# Patient Record
Sex: Male | Born: 1950
Health system: Southern US, Community
[De-identification: ages and names within clinical notes are randomized; demographics above are authoritative.]

## PROBLEM LIST (undated history)

## (undated) DIAGNOSIS — R739 Hyperglycemia, unspecified: Secondary | ICD-10-CM

## (undated) DIAGNOSIS — I255 Ischemic cardiomyopathy: Secondary | ICD-10-CM

## (undated) DIAGNOSIS — Z45010 Encounter for checking and testing of cardiac pacemaker pulse generator [battery]: Secondary | ICD-10-CM

## (undated) DIAGNOSIS — E785 Hyperlipidemia, unspecified: Secondary | ICD-10-CM

## (undated) DIAGNOSIS — I5022 Chronic systolic (congestive) heart failure: Secondary | ICD-10-CM

## (undated) DIAGNOSIS — I2510 Atherosclerotic heart disease of native coronary artery without angina pectoris: Secondary | ICD-10-CM

## (undated) DIAGNOSIS — M1990 Unspecified osteoarthritis, unspecified site: Secondary | ICD-10-CM

## (undated) DIAGNOSIS — I5032 Chronic diastolic (congestive) heart failure: Secondary | ICD-10-CM

## (undated) DIAGNOSIS — R0602 Shortness of breath: Secondary | ICD-10-CM

## (undated) DIAGNOSIS — I10 Essential (primary) hypertension: Secondary | ICD-10-CM

## (undated) DIAGNOSIS — E119 Type 2 diabetes mellitus without complications: Secondary | ICD-10-CM

## (undated) DIAGNOSIS — I5020 Unspecified systolic (congestive) heart failure: Secondary | ICD-10-CM

## (undated) DIAGNOSIS — M549 Dorsalgia, unspecified: Secondary | ICD-10-CM

## (undated) DIAGNOSIS — N183 Chronic kidney disease, stage 3 unspecified: Secondary | ICD-10-CM

## (undated) DIAGNOSIS — Z95810 Presence of automatic (implantable) cardiac defibrillator: Secondary | ICD-10-CM

## (undated) HISTORY — DX: Dorsalgia, unspecified: M54.9

## (undated) HISTORY — DX: Shortness of breath: R06.02

## (undated) HISTORY — DX: Atherosclerotic heart disease of native coronary artery without angina pectoris: I25.10

## (undated) HISTORY — DX: Essential (primary) hypertension: I10

## (undated) HISTORY — DX: Hyperlipidemia, unspecified: E78.5

## (undated) HISTORY — PX: CARDIAC CATHETERIZATION: SHX172

## (undated) HISTORY — PX: LUMBAR DISC SURGERY: SHX700

## (undated) HISTORY — DX: Encounter for checking and testing of cardiac pacemaker pulse generator (battery): Z45.010

## (undated) HISTORY — DX: Hyperglycemia, unspecified: R73.9

## (undated) HISTORY — PX: BACK SURGERY: SHX140

---

## 2001-08-08 ENCOUNTER — Encounter: Payer: Self-pay | Admitting: Neurosurgery

## 2001-08-08 ENCOUNTER — Ambulatory Visit (HOSPITAL_COMMUNITY): Admission: RE | Admit: 2001-08-08 | Discharge: 2001-08-09 | Payer: Self-pay | Admitting: Neurosurgery

## 2005-08-25 HISTORY — PX: CORONARY ANGIOPLASTY WITH STENT PLACEMENT: SHX49

## 2005-08-27 ENCOUNTER — Inpatient Hospital Stay (HOSPITAL_COMMUNITY): Admission: AD | Admit: 2005-08-27 | Discharge: 2005-08-29 | Payer: Self-pay | Admitting: Cardiology

## 2005-08-27 ENCOUNTER — Ambulatory Visit: Payer: Self-pay | Admitting: Cardiology

## 2005-09-01 ENCOUNTER — Inpatient Hospital Stay (HOSPITAL_COMMUNITY): Admission: EM | Admit: 2005-09-01 | Discharge: 2005-09-02 | Payer: Self-pay | Admitting: Emergency Medicine

## 2005-09-01 ENCOUNTER — Ambulatory Visit: Payer: Self-pay | Admitting: Internal Medicine

## 2005-09-01 ENCOUNTER — Encounter: Payer: Self-pay | Admitting: Cardiology

## 2005-09-12 ENCOUNTER — Ambulatory Visit: Payer: Self-pay | Admitting: Cardiology

## 2005-12-04 ENCOUNTER — Ambulatory Visit: Payer: Self-pay | Admitting: Cardiology

## 2005-12-20 ENCOUNTER — Ambulatory Visit: Payer: Self-pay | Admitting: Cardiology

## 2006-06-07 ENCOUNTER — Ambulatory Visit: Payer: Self-pay | Admitting: Cardiology

## 2007-08-20 ENCOUNTER — Ambulatory Visit: Payer: Self-pay | Admitting: Cardiology

## 2008-08-06 ENCOUNTER — Encounter: Payer: Self-pay | Admitting: Cardiology

## 2008-08-28 ENCOUNTER — Encounter: Payer: Self-pay | Admitting: Cardiology

## 2009-05-18 ENCOUNTER — Encounter: Payer: Self-pay | Admitting: Cardiology

## 2009-06-16 ENCOUNTER — Encounter: Payer: Self-pay | Admitting: Cardiology

## 2009-06-16 DIAGNOSIS — I2510 Atherosclerotic heart disease of native coronary artery without angina pectoris: Secondary | ICD-10-CM | POA: Insufficient documentation

## 2009-06-16 DIAGNOSIS — R0602 Shortness of breath: Secondary | ICD-10-CM | POA: Insufficient documentation

## 2009-06-16 DIAGNOSIS — E785 Hyperlipidemia, unspecified: Secondary | ICD-10-CM

## 2009-06-16 DIAGNOSIS — I10 Essential (primary) hypertension: Secondary | ICD-10-CM | POA: Insufficient documentation

## 2009-06-17 ENCOUNTER — Ambulatory Visit: Payer: Self-pay | Admitting: Cardiology

## 2009-10-26 ENCOUNTER — Encounter: Payer: Self-pay | Admitting: Cardiology

## 2009-11-03 ENCOUNTER — Encounter: Payer: Self-pay | Admitting: Cardiology

## 2009-11-25 ENCOUNTER — Encounter: Payer: Self-pay | Admitting: Physician Assistant

## 2009-12-28 ENCOUNTER — Telehealth (INDEPENDENT_AMBULATORY_CARE_PROVIDER_SITE_OTHER): Payer: Self-pay | Admitting: *Deleted

## 2009-12-29 ENCOUNTER — Encounter: Payer: Self-pay | Admitting: Cardiology

## 2010-01-03 ENCOUNTER — Ambulatory Visit: Payer: Self-pay | Admitting: Cardiology

## 2010-01-04 ENCOUNTER — Ambulatory Visit: Payer: Self-pay | Admitting: Cardiology

## 2010-01-04 ENCOUNTER — Encounter: Payer: Self-pay | Admitting: Cardiology

## 2010-02-11 ENCOUNTER — Ambulatory Visit (HOSPITAL_COMMUNITY)
Admission: RE | Admit: 2010-02-11 | Discharge: 2010-02-11 | Payer: Self-pay | Source: Home / Self Care | Admitting: Orthopedic Surgery

## 2010-04-26 ENCOUNTER — Inpatient Hospital Stay: Admission: RE | Admit: 2010-04-26 | Payer: Self-pay | Source: Home / Self Care | Admitting: Orthopedic Surgery

## 2010-04-28 NOTE — Letter (Signed)
Summary: Letter/ FAXED [facility] ABOUT SURGERY  Letter/ FAXED [facility] ABOUT SURGERY   Imported By: Dorise Hiss 12/29/2009 12:23:24  _____________________________________________________________________  External Attachment:    Type:   Image     Comment:   External Document

## 2010-04-28 NOTE — Assessment & Plan Note (Signed)
Summary: SURGICAL CLEARANCE-JM   Visit Type:  Pre-op Evaluation Referring Provider:  Donzetta Sprung Primary Provider:  Dr. Doreen Beam  CC:  CAD.  History of Present Illness: The patient is seen for followup of coronary artery disease and or cardiac clearance for major back surgery.  The patient had a non-STEMI in 2007.  He received 2 drug-eluting stents to the LAD at that time.  He has done well.  Originally with his heart disease he had significant anterior chest pain.  He has not had any of this.  He was fully active until approximately 4 months ago.  He therefore gives no history of recent angina or congestive heart failure.  There have been no substantial arrhythmias.  Unfortunately we cannot assess his exercise capacity currently because of his back pain.  Preventive Screening-Counseling & Management  Alcohol-Tobacco     Smoking Status: quit     Year Quit: 1975     Cans of tobacco/week: dip 2 cans snuff/week     Tobacco Counseling: not to resume use of tobacco products  Current Medications (verified): 1)  Lipitor 80 Mg Tabs (Atorvastatin Calcium) .[name].. 1 Tablet By Mouth At Bedtime. 2)  Lisinopril 20 Mg Tabs (Lisinopril) .... Take 1 Tablet By Mouth Once A Day 3)  Plavix 75 Mg Tabs (Clopidogrel Bisulfate) .... Take 1 Tablet By Mouth Once A Day. 4)  Toprol Xl 100 Mg Xr24h-Tab (Metoprolol Succinate) .... Take 1 Tablet By Mouth Once A Day. 5)  Aspirin Ec 325 Mg Tbec (Aspirin) .... Take One Tablet By Mouth Daily 6)  Nitrostat 0.4 Mg Subl (Nitroglycerin) .[name].. 1 Tablet Under Tongue At Onset of Chest Pain; You May Repeat Every 5 Minutes For Up To 3 Doses. 7)  Gabapentin 300 Mg Caps (Gabapentin) .... Take 1 Tablet By Mouth Three Times A Day 8)  Hydrocodone-Acetaminophen 7.5-500 Mg Tabs (Hydrocodone-Acetaminophen) .... Take 1 Tablet By Mouth Three Times A Day  Allergies (verified): No Known Drug Allergies  Comments:  Nurse/Medical Assistant: The patient's medication list and allergies  were reviewed with the patient and were updated in the Medication and Allergy Lists.  Past History:  Past Medical History: CAD.Marland KitchenNSTEMI..2007.Marland Kitchentwo DES  LAD  (kissing balloon) Plavix indefinitely SOB EF  60% Dyslipidemia Hypertension Cardiac clearance..... for back surgery... October, 2011  Social History: Smoking Status:  quit Cans of tobacco/week:  dip 2 cans snuff/week  Review of Systems       Patient denies fever, chills, headache, sweats, rash, change in vision, change in hearing, chest pain, cough, nausea vomiting, urinary symptoms.  All other systems are reviewed and are negative.  Vital Signs:  Patient profile:   60 year old male Height:      73 inches Weight:      233 pounds Pulse rate:   66 / minute BP sitting:   130 / 78  (left arm) Cuff size:   large  Vitals Entered By: Carlye Grippe (January 03, 2010 1:33 PM) CC: CAD   Physical Exam  General:  The patient is stable. Head:  head is atraumatic. Eyes:  no xanthelasma. Neck:  no jugular venous distention. Chest Wall:  no chest wall tenderness. Lungs:  lungs are clear.  Respiratory effort is nonlabored. Heart:  cardiac exam reveals S1-S2.  No clicks or significant murmurs. Abdomen:  abdomen is soft. Msk:  no musculoskeletal deformities. Extremities:  no peripheral edema. Skin:  no skin rashes. Psych:  patient is oriented to person time and place.  Affect is normal.   Impression &  Recommendations:  Problem # 1:  CAD (ICD-414.00)  His updated medication list for this problem includes:    Lisinopril 20 Mg Tabs (Lisinopril) .[name]... Take 1 tablet by mouth once a day    Plavix 75 Mg Tabs (Clopidogrel bisulfate) .[name]... Take 1 tablet by mouth once a day.    Toprol Xl 100 Mg Xr24h-tab (Metoprolol succinate) .[name]... Take 1 tablet by mouth once a day.    Aspirin Ec 325 Mg Tbec (Aspirin) .[name]... Take one tablet by mouth daily    Nitrostat 0.4 Mg Subl (Nitroglycerin) .[name]... 1 tablet under tongue at onset of chest pain;  you may repeat every 5 minutes for up to 3 doses. Coronary artery disease appears to be stable.  He is not having any chest pain although his exercise is limited by his back.  He is done today finally.  There is no significant change in the QRS.  He does have PVCs.  Orders: 2-D Echocardiogram (2D Echo)  Problem # 2:  * PLAVIX INDEFINITELY The patient continues on aspirin and Plavix.  We are using Plavix long-term.  I do feel that we can hold his Plavix for his back surgery.  It can be resumed after the surgery.  Problem # 3:  SHORTNESS OF BREATH (ICD-786.05)  His updated medication list for this problem includes:    Lisinopril 20 Mg Tabs (Lisinopril) .[name]... Take 1 tablet by mouth once a day    Toprol Xl 100 Mg Xr24h-tab (Metoprolol succinate) .[name]... Take 1 tablet by mouth once a day.    Aspirin Ec 325 Mg Tbec (Aspirin) .[name]... Take one tablet by mouth daily He is not bothered by any shortness of breath.  No further workup.  Orders: 2-D Echocardiogram (2D Echo)  Problem # 4:  DYSLIPIDEMIA (ICD-272.4)  His updated medication list for this problem includes:    Lipitor 80 Mg Tabs (Atorvastatin calcium) .[name]... 1 tablet by mouth at bedtime. Lipitor is being used to treat his lipids.  No change in therapy.  Problem # 5:  HYPERTENSION (ICD-401.9)  His updated medication list for this problem includes:    Lisinopril 20 Mg Tabs (Lisinopril) .[name]... Take 1 tablet by mouth once a day    Toprol Xl 100 Mg Xr24h-tab (Metoprolol succinate) .[name]... Take 1 tablet by mouth once a day.    Aspirin Ec 325 Mg Tbec (Aspirin) .[name]... Take one tablet by mouth daily Blood pressure is controlled.  No change in therapy.  Problem # 6:  * CARDIAC CLEARANCE FOR BACK SURGERY The patient is not having any significant symptoms.  However we cannot assess his exercise capacity because of his low back pain.  He was fully active before his back started bothering him approximately 4 months ago.  He has not had a formal exercise  test since his intervention in 2007.  He has PVCs today.  Two-dimensional echo will be done to reassess LV function.  If his LV function remains normal, I feel that no further cardiac testing is needed before his back surgery.  If he has developed any left ventricular dysfunction further workup will be needed.  We will arrange for his Actos and be in touch with him and Dr. Alveda Reasons soon.  Patient Instructions: 1)  Your physician has requested that you have an echocardiogram.  Echocardiography is a painless test that uses sound waves to create images of your heart. It provides your doctor with information about the size and shape of your heart and how well your heart's chambers and  valves are working.  This procedure takes approximately one hour. There are no restrictions for this procedure. 2)  Your physician wants you to follow-up in: 1 year. You will receive a reminder letter in the mail one-two months in advance. If you don't receive a letter, please call our office to schedule the follow-up appointment.

## 2010-04-28 NOTE — Letter (Signed)
Summary: Appointment- Rescheduled  [facility] [address]   [place], [place] [address]   Phone: [phone]  Fax: [phone]     May 18, 2009 MRN: [id number]     [facility] [address] [place], [place]  [address]     Dear Mr. Nader,   Due to a change in our office schedule, your appointment on  March 18,2011 at  1:00pm  must be changed.    Your new appointment is scheduled for March 24,2011 at 2:00 pm.  We look forward to participating in your health care needs.   Please contact us at the number listed above at your earliest convenience to reschedule this appointment if needed.     Sincerely,  Glass blower/designer

## 2010-04-28 NOTE — Miscellaneous (Signed)
  Clinical Lists Changes  Problems: Added new problem of CAD (ICD-414.00) Added new problem of * PLAVIX INDEFINITELY Added new problem of SHORTNESS OF BREATH (ICD-786.05) Added new problem of DYSLIPIDEMIA (ICD-272.4) Added new problem of HYPERTENSION (ICD-401.9) Observations: Added new observation of PAST MED HX: CAD.Marland KitchenNSTEMI..2007.Marland Kitchentwo DES  LAD  (kissing balloon) Plavix indefinitely SOB EF  60% Dyslipidemia Hypertension (06/16/2009 11:59)       Past History:  Past Medical History: CAD.Marland KitchenNSTEMI..2007.Marland Kitchentwo DES  LAD  (kissing balloon) Plavix indefinitely SOB EF  60% Dyslipidemia Hypertension

## 2010-04-28 NOTE — Letter (Signed)
Summary: Letter/ FAXED MEDICAL CLEARANCE TO DR. DALTON-BETHA  Letter/ FAXED MEDICAL CLEARANCE TO DR. DALTON-BETHA   Imported By: Dorise Hiss 11/25/2009 16:27:44  _____________________________________________________________________  External Attachment:    Type:   Image     Comment:   External Document

## 2010-04-28 NOTE — Progress Notes (Signed)
Summary: SURGICAL CLEARANCE  Phone Note Call from Patient Call back at Home Phone [phone]   Caller: PATIENT WALK IN  Reason for Call: Talk to Nurse Summary of Call: PATIENT BROUGHT A LETTER IN FROM DR. MICHAEL T. TOOKE STATING HE NEEDS CARDIAC CLEARANCE & PERIOPERATIVE MANAGENT OF PLAVIX ...Marland KitchenLETTER HAS BEEN GIVEN TO Lucendia Herrlich  DOCTOR'S PHONE #[phone] FAX #[phone] Initial call taken by: Claudette Laws,  December 28, 2009 10:44 AM  Follow-up for Phone Call        Letter will be given to Dr. Myrtis Ser for his review. Follow-up by: Cyril Loosen, RN, BSN,  December 28, 2009 4:57 PM

## 2010-04-28 NOTE — Assessment & Plan Note (Signed)
Summary: 1 YR FU PER MAY FU REMINDER-SRS   Visit Type:  Follow-up Primary Provider:  Dr. Doreen Beam  CC:  CAD.  History of Present Illness: Patient is seen for followup coronary artery disease.  I saw him last in May, 2009.  He is doing well.  He had a non-ST elevation MI in 2007. This symptom at that time was substantial anterior chest pain.  He works in a [employer] and he has no symptoms.  He has not had chest pain or shortness of breath.  There is no syncope or presyncope.  Preventive Screening-Counseling & Management  Alcohol-Tobacco     Cans of tobacco/week: 2  Comments: Pt stopped smoking about 25 yrs ago after smoking for about 10-15 yrs. He dips about 2 cans per week of smokeless tobacco. He has been using smokeless tobacco for about 20 yrs.  Current Medications (verified): 1)  Lipitor 80 Mg Tabs (Atorvastatin Calcium) .[name].. 1 Tablet By Mouth At Bedtime. 2)  Lisinopril 20 Mg Tabs (Lisinopril) .... Take 1 Tablet By Mouth Once A Day 3)  Plavix 75 Mg Tabs (Clopidogrel Bisulfate) .... Take 1 Tablet By Mouth Once A Day. 4)  Toprol Xl 100 Mg Xr24h-Tab (Metoprolol Succinate) .... Take 1 Tablet By Mouth Once A Day. 5)  Aspirin Ec 325 Mg Tbec (Aspirin) .... Take One Tablet By Mouth Daily 6)  Nitrostat 0.4 Mg Subl (Nitroglycerin) .[name].. 1 Tablet Under Tongue At Onset of Chest Pain; You May Repeat Every 5 Minutes For Up To 3 Doses.  Allergies: No Known Drug Allergies  Comments:  Nurse/Medical Assistant: The patient's medications were reviewed with the patient and were updated in the Medication List. Pt brought a list of medications to office visit.  Cyril Loosen, RN, BSN (June 17, 2009 2:01 PM)  Past History:  Past Medical History: Last updated: 06/16/2009 CAD.Marland KitchenNSTEMI..2007.Marland Kitchentwo DES  LAD  (kissing balloon) Plavix indefinitely SOB EF  60% Dyslipidemia Hypertension  Social History: Cans of tobacco/week:  2  Review of Systems       Patient denies fever, chills,  headache, sweats, rash, change in vision, change in hearing, chest pain, cough, shortness of breath, nausea or vomiting, urinary symptoms.  All other systems are reviewed and are negative.  Vital Signs:  Patient profile:   60 year old male Height:      73 inches Weight:      226 pounds BMI:     29.92 Pulse rate:   55 / minute BP sitting:   142 / 83  (right arm) Cuff size:   regular  Vitals Entered By: Cyril Loosen, RN, BSN (June 17, 2009 1:56 PM)  Nutrition Counseling: Patient's BMI is greater than 25 and therefore counseled on weight management options. CC: CAD Comments No cardiac complaints. Pt is here for 1 yr follow up   Physical Exam  General:  patient is quite stable. Eyes:  no xanthelasma. Neck:  no jugular venous extension. Lungs:  lungs are clear.  Respiratory effort is not labored. Heart:  cardiac exam reveals S1 and S2.  No clicks or significant murmurs. Abdomen:  abdomen soft. Msk:  no musculoskeletal deformities. Extremities:  no peripheral edema. Psych:  patient is oriented to person time and place.  Affect is normal.   Impression & Recommendations:  Problem # 1:  HYPERTENSION (ICD-401.9)  His updated medication list for this problem includes:    Lisinopril 20 Mg Tabs (Lisinopril) .[name]... Take 1 tablet by mouth once a day    Toprol Xl  100 Mg Xr24h-tab (Metoprolol succinate) .[name]... Take 1 tablet by mouth once a day.    Aspirin Ec 325 Mg Tbec (Aspirin) .[name]... Take one tablet by mouth daily blood pressure is under good control.  No change in therapy.  Problem # 2:  DYSLIPIDEMIA (ICD-272.4)  His updated medication list for this problem includes:    Lipitor 80 Mg Tabs (Atorvastatin calcium) .[name]... 1 tablet by mouth at bedtime. Patient is on high-dose Lipitor and his labs were followed by Dr.Vyas.  Problem # 3:  CAD (ICD-414.00)  His updated medication list for this problem includes:    Lisinopril 20 Mg Tabs (Lisinopril) .[name]... Take 1 tablet by mouth once a  day    Plavix 75 Mg Tabs (Clopidogrel bisulfate) .[name]... Take 1 tablet by mouth once a day.    Toprol Xl 100 Mg Xr24h-tab (Metoprolol succinate) .[name]... Take 1 tablet by mouth once a day.    Aspirin Ec 325 Mg Tbec (Aspirin) .[name]... Take one tablet by mouth daily    Nitrostat 0.4 Mg Subl (Nitroglycerin) .[name]... 1 tablet under tongue at onset of chest pain; you may repeat every 5 minutes for up to 3 doses.  Orders: EKG w/ Interpretation (93000)it EKG is done today and reviewed by me.  There were mild nonspecific ST changes with sinus bradycardia.  Patient's cardiac status is stable.  He does not need any further workup at this time.  I would like to see him for cardiology followup in one year.  Patient Instructions: 1)  Your physician recommends that you continue on your current medications as directed. Please refer to the Current Medication list given to you today. 2)  Your physician wants you to follow-up in: 12 MONTHS. You will receive a reminder letter in the mail about two months in advance. If you don't receive a letter, please call our office to schedule the follow-up appointment.

## 2010-05-27 ENCOUNTER — Observation Stay (HOSPITAL_COMMUNITY)
Admission: AD | Admit: 2010-05-27 | Discharge: 2010-05-28 | DRG: 124 | Disposition: A | Discharge status: Home / Self Care | Payer: BC Managed Care – PPO | Source: Other Acute Inpatient Hospital | Attending: Cardiology | Admitting: Cardiology

## 2010-05-27 ENCOUNTER — Encounter: Payer: Self-pay | Admitting: Cardiology

## 2010-05-27 DIAGNOSIS — I2510 Atherosclerotic heart disease of native coronary artery without angina pectoris: Secondary | ICD-10-CM | POA: Insufficient documentation

## 2010-05-27 DIAGNOSIS — Z23 Encounter for immunization: Secondary | ICD-10-CM | POA: Insufficient documentation

## 2010-05-27 DIAGNOSIS — Z01810 Encounter for preprocedural cardiovascular examination: Secondary | ICD-10-CM | POA: Insufficient documentation

## 2010-05-27 DIAGNOSIS — Z9861 Coronary angioplasty status: Secondary | ICD-10-CM | POA: Insufficient documentation

## 2010-05-27 DIAGNOSIS — R0789 Other chest pain: Principal | ICD-10-CM | POA: Insufficient documentation

## 2010-05-27 DIAGNOSIS — Z01812 Encounter for preprocedural laboratory examination: Secondary | ICD-10-CM | POA: Insufficient documentation

## 2010-05-27 DIAGNOSIS — R079 Chest pain, unspecified: Secondary | ICD-10-CM

## 2010-05-27 LAB — HEMOGLOBIN A1C
Hgb A1c MFr Bld: 6.1 % — ABNORMAL HIGH (ref <5.7)
Mean Plasma Glucose: 128 mg/dL — ABNORMAL HIGH (ref <117)

## 2010-05-27 LAB — LIPID PANEL
Cholesterol: 224 mg/dL — ABNORMAL HIGH (ref 0–200)
HDL: 31 mg/dL — ABNORMAL LOW (ref >39)

## 2010-05-27 LAB — CARDIAC PANEL(CRET KIN+CKTOT+MB+TROPI)
CK, MB: 1.4 ng/mL (ref 0.3–4.0)
Troponin I: 0.01 ng/mL (ref 0.00–0.06)

## 2010-05-27 LAB — POCT ACTIVATED CLOTTING TIME: Activated Clotting Time: 128 seconds

## 2010-05-28 LAB — CBC
HCT: 45.3 % (ref 39.0–52.0)
MCHC: 33.6 g/dL (ref 30.0–36.0)
Platelets: 131 10*3/uL — ABNORMAL LOW (ref 150–400)
RDW: 14.8 % (ref 11.5–15.5)

## 2010-05-28 LAB — BASIC METABOLIC PANEL
Calcium: 9.2 mg/dL (ref 8.4–10.5)
GFR calc Af Amer: >60 mL/min (ref >60)
GFR calc non Af Amer: >60 mL/min (ref >60)
Glucose, Bld: 153 mg/dL — ABNORMAL HIGH (ref 70–99)
Sodium: 142 mEq/L (ref 135–145)

## 2010-05-28 LAB — LIPID PANEL
Cholesterol: 233 mg/dL — ABNORMAL HIGH (ref 0–200)
Triglycerides: 215 mg/dL — ABNORMAL HIGH (ref <150)

## 2010-06-02 NOTE — Consult Note (Signed)
Summary: CARDIOLOGY CONSULT/ [facility]  CARDIOLOGY CONSULT/ [facility]   Imported By: Zachary George 05/27/2010 13:37:45  _____________________________________________________________________  External Attachment:    Type:   Image     Comment:   External Document

## 2010-06-03 NOTE — Discharge Summary (Signed)
Anthony Adams, Anthony Adams                  ACCOUNT NO.:  [id number]  MEDICAL RECORD NO.:  [id number]          PATIENT TYPE:  LOCATION:                                 FACILITY:  PHYSICIAN:  Learta Codding, MD,FACC DATE OF BIRTH:  1950-07-24  DATE OF ADMISSION: DATE OF DISCHARGE:                              DISCHARGE SUMMARY   DATE OF ADMISSION:  May 27, 2010.  DATE OF DISCHARGE:  May 28, 2010.  PRIMARY CARDIOLOGIST:  Luis Abed, MD, [facility].  PRIMARY CARE PROVIDER:  Dr. Sherril Croon.  DISCHARGE DIAGNOSES: 1. Coronary artery disease/acute coronary syndrome, no culprit lesion     per cardiac catheterization on May 27, 2010.     a.     Significant 2-vessel coronary artery disease with patent      stents in the left anterior descending artery.  Ostial disease in      the third diagonal was worse than prior cardiac catheterization in      2007, now 80%.  The disease in the distal LAD as well as in the      posterolateral branches are unchanged from prior cardiac      catheterization in 2012.     b.     Status post non-ST-elevation myocardial infarction in June      2007, status post bare-metal stent to the left anterior descending      artery x2 with angioplasty to the diagonal. 2. Plavix, recently stopped secondary to steroid injection. 3. Hypertension. 4. Hyperlipidemia. 5. Elevated blood sugars, questionable early diabetes mellitus.  ALLERGIES:  No known drug allergies.  PROCEDURES/DIAGNOSTICS:  Performed during hospitalization: 1. Left heart catheterization with coronary angiography and left     ventricular angiography.     a.     Significant two-vessel coronary artery disease with patent      stents in the left anterior descending artery.  Disease in the      distal LAD as well as the posterolateral branches are unchanged      from 2007.  The ostial disease in the third diagonal is worse than      before and is now 80%.     b.     Low-normal LV systolic function with ejection  fraction of      50% to 55% with borderline anterior wall hypokinesis.  REASON FOR HOSPITALIZATION:  This is a 60 year old gentleman with known coronary artery disease.  As stated above, he has been doing well since 2007.  He was recently taken off his Plavix for an epidural injection, this was for a total of 7 days.  He presented to [facility] Emergency Department with complaints of chest pain that awoke him from his sleep. This was considered chest tightness similar to his prior anginal symptoms.  He had no acute EKG changes, but did have frequent PVCs.  The initial set of troponins were negative.  With patient's clinical symptoms consistent with an acute coronary syndrome in the setting of discontinuation of his Plavix, it was felt that there could be a late stent thrombosis.  It was felt that patient needed urgent  cardiac catheterization and therefore was transferred to [facility].  HOSPITAL COURSE:  Dr. Kirke Corin brought the patient to the cardiac cath lab on May 27, 2010.  Risks, benefits, and indications were discussed withthe patient, and he agreed to proceed.  Informed consent was obtained. As above, there was significant 2-vessel coronary artery disease with patent stents in the left anterior descending artery.  The disease in the distal LAD as well as the posterolateral branch are unchanged.  This is compared to catheterization in 2007.  He did have an ostial disease in the third diagonal that was worse than before, now 80%, although this did not appear to be the acute lesion.  Dr. Kirke Corin felt no culprit was identified.  He states, it is possible that he had an early thrombosis that resolved and was not identifiable at that time.  There does appear to be chronic disease in the third diagonal as well as the posterolateral branches, but overall, the third diagonal branch is 2 mm in size.  At this time, medical therapy was recommended.  Dr. Kirke Corin stated that he would reserve  angioplasty in the third diagonal branch only for refractory symptoms of angina.  It was recommended that patient continue on aspirin and Plavix.  The patient was taken for overnight observation.  He had no further complaints of chest pain or shortness of breath.  A repeat EKG showed normal sinus rhythm without acute changes.  On day of discharge, Dr. Andee Lineman evaluated the patient and noted him stable for home with outpatient followup.  He was mildly hypertensive with systolic pressures in the 150s, therefore patient was given chlorthalidone to start daily.  He was also noted to have elevated blood sugars that could represent early diabetes mellitus, this will be followed as an outpatient.  Dr. Andee Lineman talked with Dr. Sherril Croon about this, and he will follow up with his primary care physician.  The patient's right groin site was without evidence of hematoma.  He was able to ambulate without difficulty.  DISCHARGE LABS:  WBC 8.1, hemoglobin 15.2, hematocrit 45.3, and platelets 131.  Sodium 142, potassium 4.2, chloride 104, bicarb 29, BUN 10, and creatinine 0.96.  Cholesterol 233, triglycerides 215, HDL 29, and LDL 161.  Hemoglobin A1c 6.1.  DISCHARGE MEDICATIONS: 1. Lisinopril 20 mg daily. 2. Chlorthalidone 25 mg daily. 3. Nitroglycerin sublingual 0.4 mg 1 tablet under tongue every 5     minutes, up to three doses as needed for chest pain. 4. Aspirin enteric-coated 325 mg 1 tablet daily. 5. Calcium 300 mg 1 capsule three times a day. 6. Hydrocodone/APAP 5/325 one tablet every 6 hours as needed. 7. Lipitor 80 mg daily. 8. Plavix 75 mg daily. 9. Toprol XL 100 mg daily.  DISCHARGE/PLANS AND INSTRUCTIONS: 1. The patient will follow up with Dr. Myrtis Ser in the [place] office.  Our     office will call to schedule this appointment. 2. The patient is to increase activity slowly.  He may shower, but no     bathing.  No lifting for 1 week greater than 5 pounds.  No driving     for 2 days.  No sexual  activity for 1 week.  He is to keep his cath     site clean and dry and call our office for any problems. 3. The patient is to continue a low-sodium heart-healthy diet. 4. He is to avoid any straining or activity that causes chest pain or     shortness of breath.  DURATION OF  DISCHARGE:  Greater than 30 minutes with physician and physician-extended time.     Leonette Monarch, PA-C   ______________________________ Learta Codding, MD,FACC    NB/MEDQ  D:  05/28/2010  T:  05/29/2010  Job:  [id number]  cc:   Luis Abed, MD, [facility] Dr. Sherril Croon  Electronically Signed by Alen Blew P.A. on 06/02/2010 01:34:29 PM Electronically Signed by Lewayne Bunting MDFACC on 06/02/2010 03:48:39 PM

## 2010-06-07 ENCOUNTER — Encounter: Payer: Self-pay | Admitting: *Deleted

## 2010-06-07 LAB — URINE CULTURE
Colony Count: NO GROWTH
Culture  Setup Time: 201111181240

## 2010-06-07 LAB — URINALYSIS, ROUTINE W REFLEX MICROSCOPIC
Glucose, UA: NEGATIVE mg/dL
Ketones, ur: NEGATIVE mg/dL
pH: 6 (ref 5.0–8.0)

## 2010-06-07 LAB — COMPREHENSIVE METABOLIC PANEL
Albumin: 4.2 g/dL (ref 3.5–5.2)
BUN: 7 mg/dL (ref 6–23)
Creatinine, Ser: 0.83 mg/dL (ref 0.4–1.5)
Total Protein: 7.1 g/dL (ref 6.0–8.3)

## 2010-06-07 LAB — CBC
MCH: 29.7 pg (ref 26.0–34.0)
MCV: 86.7 fL (ref 78.0–100.0)
Platelets: 141 10*3/uL — ABNORMAL LOW (ref 150–400)
RDW: 13.6 % (ref 11.5–15.5)

## 2010-06-07 LAB — APTT: aPTT: 34 seconds (ref 24–37)

## 2010-06-07 LAB — DIFFERENTIAL
Basophils Absolute: 0 10*3/uL (ref 0.0–0.1)
Lymphocytes Relative: 19 % (ref 12–46)
Monocytes Absolute: 0.4 10*3/uL (ref 0.1–1.0)
Monocytes Relative: 6 % (ref 3–12)
Neutro Abs: 5 10*3/uL (ref 1.7–7.7)

## 2010-06-07 LAB — TYPE AND SCREEN: Antibody Screen: NEGATIVE

## 2010-06-07 LAB — VITAMIN D 25 HYDROXY (VIT D DEFICIENCY, FRACTURES): Vit D, 25-Hydroxy: 16 ng/mL — ABNORMAL LOW (ref 30–89)

## 2010-06-23 NOTE — Procedures (Signed)
NAME:  Anthony Adams, Anthony Adams NO.:  [id number]  MEDICAL RECORD NO.:  [id number]           PATIENT TYPE:  LOCATION:                                 FACILITY:  PHYSICIAN:  Lorine Bears, MD     DATE OF BIRTH:  09-Dec-1950  DATE OF PROCEDURE: DATE OF DISCHARGE:                           CARDIAC CATHETERIZATION   PRIMARY CARDIOLOGIST:  Luis Abed, MD, [facility]  REFERRING PHYSICIAN:  Learta Codding, MD, [facility]  PROCEDURES PERFORMED: 1. Left heart catheterization. 2. Coronary angiography. 3. Left ventricular angiography.  INDICATIONS AND CLINICAL HISTORY:  Anthony Adams is a 60 year old gentleman with known history of coronary artery disease, status post angioplasty and 2 stent placements to the LAD in 2007.  He has been stable since then.  He was recently taken off Plavix for epidural injection.  He was off Plavix until yesterday for 7 days when he had his procedure done. He started having chest pain last night at 2 in the morning.  It woke him from sleep with chest tightness similar to his previous angina.  The chest pain was intermittent since then until he went to the emergency room at [facility].  He did not have acute EKG changes, but he was having frequent PVCs.  He was referred for urgent cardiac catheterization and possible coronary intervention.  Risks, benefits, and alternatives were discussed with the patient.  STUDY DETAILS:  A standard informed consent was obtained.  He was given fentanyl and Versed for sedation.  The right groin area was prepped in a sterile fashion.  It was anesthetized with 1% lidocaine.  A 6-French sheath was placed in the right femoral artery after anterior puncture. Coronary angiography was performed with a JL-4, JR-4, and an angled pigtail catheters.  All catheter exchanges were done over the wire.  The patient tolerated the procedure well with no immediate complications.  STUDY FINDINGS:  Hemodynamic findings:  Left ventricular  pressure is 144/16 with a left ventricular end-diastolic pressure of 19 mmHg. Aortic pressure is 137/84 with a mean pressure of 108 mmHg.  Left ventricular angiography:  This showed a low normal LV systolic function with an ejection fraction of 50-55% with borderline anterior wall hypokinesis.  Coronary angiography: Left main coronary artery:  The vessel was normal in size with 10% distal stenosis.  Left circumflex artery:  The vessel was overall small size and nondominant.  There are 2 obtuse marginal branches which are free of significant obstructive disease.  Ramus coronary artery:  The vessel was overall normal in size with 10% mid stenosis and no obstructive disease.  Left anterior descending artery:  The vessel was normal in size.  There is 30% stenosis in the mid segment before previously placed stent. There are 2 stents that are placed in the distal LAD.  Both of them are patent with only mild in-stent restenosis.  Between the two stents, there is a 20% lesion.  Distal to the stent, there is 30% lesion.  The distal LAD has an 80-90% stenosis close to the apex which is really unchanged from his previous angiogram.  The first  diagonal is overall small in size and free of significant disease.  Second diagonal is normal in size with mild 20% ostial stenosis.  The third diagonal overall is a normal-sized vessel, but the diameter is 2 mm or less. There is an 80% ostial stenosis.  In the mid segment, there is a 40% stenosis.  Right coronary artery:  The vessel was very large in size and dominant. There is a diffuse 20% disease proximally.  There is a 30% tubular stenosis in the mid segment.  The PDA is a large-sized vessel with diffuse 40% proximal disease.  The first posterolateral branch is overall medium in size with a 90% ostial and proximal disease which is not different from his previous angiogram.  The second posterolateral branch is relatively large branch and has 80%  diffuse disease throughout its course which is also not different from his previous angiogram.  CONCLUSIONS: 1. Significant 2-vessel coronary artery disease with patent stents in     the left anterior descending artery. 2. The catheterization was compared with his previous one in 2007.     The ostial disease in the third diagonal is worse than before and     now is 80%.  However, this does not seem to be acute.  The vessel     diameter is 2 mm. 3. The disease in the distal LAD as well as the posterolateral     branches are unchanged when compared to his previous angiogram.     These lesions are too distal to consider revascularization.  RECOMMENDATIONS: 1. No culprit is identified on the current angiogram for his     presentation.  It is possible that he had early thrombosis that     resolved and now was not identifiable.  He does have what seems to     be chronic disease in the third diagonal as well as posterolateral     branches.  The third diagonal branch is overall 2 mm in size.  I     would reserve angioplasty to that vessel only for refractory     symptoms of angina. 2. We will recommend medical therapy.  We will cycle cardiac enzymes     and monitor given that he is having frequent PVCs.  We will also     request a transthoracic echocardiogram to look for other     etiologies.     Lorine Bears, MD   ______________________________ Lorine Bears, MD    MA/MEDQ  D:  05/27/2010  T:  05/28/2010  Job:  [id number]  cc:   Luis Abed, MD, [facility]  Electronically Signed by Lorine Bears MD on 06/23/2010 11:30:09 PM

## 2010-07-04 ENCOUNTER — Encounter: Payer: Self-pay | Admitting: Cardiology

## 2010-07-04 ENCOUNTER — Ambulatory Visit (INDEPENDENT_AMBULATORY_CARE_PROVIDER_SITE_OTHER): Payer: BC Managed Care – PPO | Admitting: Cardiology

## 2010-07-04 DIAGNOSIS — M549 Dorsalgia, unspecified: Secondary | ICD-10-CM

## 2010-07-04 DIAGNOSIS — I2510 Atherosclerotic heart disease of native coronary artery without angina pectoris: Secondary | ICD-10-CM

## 2010-07-04 DIAGNOSIS — R739 Hyperglycemia, unspecified: Secondary | ICD-10-CM | POA: Insufficient documentation

## 2010-07-04 DIAGNOSIS — M109 Gout, unspecified: Secondary | ICD-10-CM

## 2010-07-04 DIAGNOSIS — E785 Hyperlipidemia, unspecified: Secondary | ICD-10-CM

## 2010-07-04 DIAGNOSIS — I10 Essential (primary) hypertension: Secondary | ICD-10-CM

## 2010-07-04 MED ORDER — LISINOPRIL 20 MG PO TABS: 10.0000 mg | ORAL_TABLET | Freq: Every day | ORAL | Status: DC

## 2010-07-04 MED ORDER — AMLODIPINE BESYLATE 2.5 MG PO TABS: 2.5000 mg | ORAL_TABLET | Freq: Two times a day (BID) | ORAL | Status: DC

## 2010-07-04 NOTE — Patient Instructions (Signed)
   Start Norvasc (amlodipine) 2.5mg  once daily for 3-4 days. If no problems, increase to two times a day.  When you increase Norvasc to two times a day, decrease Lisinopril to 1/2 tablet once daily.  Follow up as scheduled.

## 2010-07-04 NOTE — Progress Notes (Signed)
HPI The patient is seen for followup coronary disease and recent hospitalization.  He received bare metal stent to the LAD with angioplasty of a diagonal in 2007.  He has been on aspirin and Plavix.  Recently after holding his aspirin and Plavix for a back injection, he had significant chest pain similar to his original pain.  He was admitted and enzymes did not show any abnormalities.  Cardiac catheterization was done and there was no culprit lesion.  However, he did have worsening of a third diagonal with 80% stenosis.  It was felt that this vessel is small and that intervention should be done only if he had recurrent significant symptoms.  Since being at home he has had some mild chest discomfort.  He gets immediate relief from one nitroglycerin.  Nitroglycerin does cause a headache.  There will be ongoing consideration from his insurance company as to whether or not he can have back surgery.  We will have to decide very carefully about whether his aspirin and Plavix can be held.  Unfortunately in addition he has had a recent acute gouty attack.  He is feeling better now.   No Known Allergies  Current Outpatient Prescriptions  Medication Sig Dispense Refill  . aspirin 325 MG tablet Take 325 mg by mouth daily.        [name] atorvastatin (LIPITOR) 80 MG tablet Take 80 mg by mouth daily.        . chlorthalidone (HYGROTON) 25 MG tablet Take 25 mg by mouth daily.        . clopidogrel (PLAVIX) 75 MG tablet Take 75 mg by mouth daily.        [name] COLCRYS 0.6 MG tablet Take 0.6 mg by mouth daily.       [name] esomeprazole (NEXIUM) 40 MG packet Take 40 mg by mouth daily before breakfast.        . gabapentin (NEURONTIN) 600 MG tablet Take 600 mg by mouth 3 (three) times daily.        [name] HYDROcodone-acetaminophen (NORCO) 5-325 MG per tablet Take 1 tablet by mouth every 6 (six) hours as needed.        [name] lisinopril (PRINIVIL,ZESTRIL) 20 MG tablet Take 20 mg by mouth daily.        . metoprolol (TOPROL-XL) 100 MG 24 hr  tablet Take 100 mg by mouth daily.        . nitroGLYCERIN (NITROSTAT) 0.4 MG SL tablet Place 0.4 mg under the tongue every 5 (five) minutes as needed.        . pantoprazole (PROTONIX) 40 MG tablet Take 40 mg by mouth daily.       [name] DISCONTD: gabapentin (NEURONTIN) 300 MG capsule Take 300 mg by mouth 3 (three) times daily.        [name] DISCONTD: HYDROcodone-acetaminophen (LORTAB) 7.5-500 MG per tablet Take 1 tablet by mouth every 6 (six) hours as needed.          History   Social History  . Marital Status: Single    Spouse Name: N/A    Number of Children: N/A  . Years of Education: N/A   Occupational History  . Scientist, product/process development    Social History Main Topics  . Smoking status: Former Smoker -- 2.0 packs/day for 15 years    Types: Cigarettes    Quit date: 09/24/1973  . Smokeless tobacco: Former Neurosurgeon    Types: Snuff    Quit date: 05/27/2010  . Alcohol Use: No  . Drug Use:  No  . Sexually Active: Not on file   Other Topics Concern  . Not on file   Social History Narrative  . No narrative on file    Family History  Problem Relation Age of Onset  . Coronary artery disease Father     Past Medical History  Diagnosis Date  . SOB (shortness of breath)   . Dyslipidemia   . Hypertension     EF 50-55%, borderline anterior hypokinesis, catheterization March, 2012  . CAD (coronary artery disease)      Chest pain while off Plavix, March, 2012, catheterization, LAD stents patent, worsening third diagonal 80%-use PCI only for recurrent significant symptoms /  ..NSTEMI..1610RUE DES LAD (kissing balloon)  . CAD (coronary artery disease)     plavix indefinitly  . CAD (coronary artery disease)     cardiac clearance .[name].for back surgery...october 2011  . Blood glucose elevated     March, 2012    No past surgical history on file.  ROS  Patient denies fever, chills, headache, sweats, rash, change in vision, change in hearing, cough, nausea vomiting, urinary symptoms.  All other systems are  reviewed and are negative.   PHYSICAL EXAM Patient is stable today.  He is oriented to person time and place.  Affect is normal.  Head is atraumatic.  There is no xanthelasma.  There is no jugular venous distention.  There are no carotid bruits.  Lungs are clear.  Respiratory effort is unlabored.  Cardiac exam reveals muscle and S2.  No clicks or significant murmurs.  The abdomen is soft.  There is no peripheral edema.  There are no musculoskeletal deformities.  No skin rashes.   Filed Vitals:   07/04/10 1525  BP: 137/82  Pulse: 63  Height: 6\' 1"  (1.854 m)  Weight: 227 lb (102.967 kg)    EKG  No EKG is done today as the patient recently was assessed in the hospital.  ASSESSMENT & PLAN

## 2010-07-04 NOTE — Assessment & Plan Note (Signed)
Lipids are being treated. No change in therapy. 

## 2010-07-04 NOTE — Assessment & Plan Note (Signed)
The situation is complicated by the fact patient needs to have a procedure to help with his back pain.  At this time we would be very hesitant to hold his aspirin and Plavix if we really believe that this is the basis of his recent problems.  As I added Norvasc see him back in followup and see if he has relief of the brief pain and is having.  If so I'll feel better about the possibility of holding his Plavix for back surgery.

## 2010-07-04 NOTE — Assessment & Plan Note (Signed)
Blood pressure is controlled today.  He should tolerate the addition of a small amount of Norvasc I will lower his lisinopril at the same time.

## 2010-07-04 NOTE — Assessment & Plan Note (Signed)
The patient has had some mild recurrent chest pain since his hospitalization.  He says that one nitroglycerin helps immediately.  We know that he had progression of a third diagonal.  We know that PCI to this area is being considered only if he has unrelenting symptoms.  The original etiology of his most recent pain remains unclear.  On a temporal basis it occurred on the day of back injection after his aspirin and Plavix have been held.  He stopped his aspirin and Plavix after the procedure.  It is possible that this helped with any potential clot forming and that clot was gone by the time catheterization was done.  However there was no enzyme elevation.  It is also possible that he had some pain from his 80% diagonal lesion and that he continues to have some pain from this as an outpatient.  I do not know if spasm could be playing a role.  With his headaches I cannot add nitroglycerin for him to work.  I will try to add low-dose Norvasc.

## 2010-07-04 NOTE — Assessment & Plan Note (Signed)
He has had recent gout pain.  I am hopeful that this will not become bigger problem going forward.  As part of today's evaluation I have reviewed the hospital records extensively and reviewed all of his problems.

## 2010-08-02 ENCOUNTER — Ambulatory Visit: Payer: BC Managed Care – PPO | Admitting: Cardiology

## 2010-08-09 NOTE — Assessment & Plan Note (Signed)
[facility]                          [facility] NOTE   NAME:Anthony Adams, Anthony Adams                         MRN:          [id number]  DATE:08/20/2007                            DOB:          [birth year]    Anthony Adams has known coronary disease.  He had been followed by Dr. Samule Ohm  in the [place] office.  Dr. Samule Ohm has moved to [facility], and I will  be taking over Anthony Adams care here in the [place] office.  He lives  approximately 20 minutes from here.  He is doing well.  He had a non-ST  elevation MI in June of 2007.  He received two drug-eluting stents to  the LAD with a kissing balloon procedure.  He has been stable.  He is to  remain on aspirin and Plavix indefinitely.  He has done well.  He is not  having any chest pain.  He has no shortness of breath.  Dr. Sherril Croon follows  his lipids.  The patient is compliant.  He does not smoke.  He is  exercising.   PAST MEDICAL HISTORY:   ALLERGIES:  No known drug allergies.   MEDICATIONS:  1. Metoprolol.  2. Lisinopril.  3. Aspirin.  4. Plavix.  5. Lipitor.   OTHER MEDICAL PROBLEMS:  See the list below.   REVIEW OF SYSTEMS:  He feels well, and his review of systems is  negative.   PHYSICAL EXAMINATION:  VITAL SIGNS:  Blood pressure is 130/85 with a  pulse of 70.  GENERAL:  The patient is oriented to person, time and place.  Affect is  normal.  HEENT:  No xanthelasma.  He has normal extraocular motion.  There are no  carotid bruits.  There is no jugular venous distention.  LUNGS:  Clear.  Respiratory effort is not labored.  HEART:  Cardiac exam reveals an S1 with an S2.  There are no clicks or  significant murmurs.  His abdomen is soft.  He has no masses or bruits.  He has no peripheral edema.   ELECTROCARDIOGRAM:  EKG reveals sinus rhythm with no diagnostic  abnormalities.   PROBLEMS:  1. Coronary disease status post non-ST elevation MI with two drug-      eluting stents.  2. Indefinite use  of aspirin and Plavix.  3. Ejection fraction 60%.  4. Hypercholesterolemia, treated.  5. Hypertension, treated.   Anthony Adams is stable.  He does not need any testing.  I will see him back  in 1 year.  He will follow with Dr. Sherril Croon.     Luis Abed, MD, [facility]  Electronically Signed    JDK/MedQ  DD: 08/20/2007  DT: 08/20/2007  Job #: [id number]   cc:   Doreen Beam, MD

## 2010-08-12 NOTE — Op Note (Signed)
[facility]  Patient:    MC, BLOODWORTH Visit Number: [id number] MRN: [id number]          Service Type: DSU Location: 3000 [id number] Attending Physician:  Coletta Memos Dictated by:   Mena Goes. Franky Macho, M.D. Proc. Date: 08/08/01 Admit Date:  08/08/2001 Discharge Date: 08/09/2001                             Operative Report  PREOPERATIVE DIAGNOSES: 1. Displaced disk L1-2. 2. Right lumbar radiculopathy.  POSTOPERATIVE DIAGNOSES: 1. Displaced disk L1-2. 2. Right lumbar radiculopathy.  OPERATION PERFORMED:  L1-2 semihemilaminectomies and diskectomy with microdissection.  COMPLICATIONS:  None.  SURGEON:  Kyle L. Franky Macho, M.D.  ASSISTANT:  Cristi Loron, M.D.  FINDINGS:  Large free fragment removed.  ANESTHESIA:  General.  INDICATIONS FOR PROCEDURE:  The patient is a 60 year old gentleman who presented to my office with severe pain in the right lower extremity, mainly in the right thigh.  He had numbness along the medial aspect of the thigh going into the groin.  MRI showed a large disk fragment at L1-2 on the right side.  I recommended and the patient agreed to undergo operative decompression.  DESCRIPTION OF PROCEDURE:  The patient was brought to the operating room, intubated and placed under general anesthesia without difficulty.  He was rolled prone onto a Wilson frame ensuring all pressure points were properly padded.  I placed the needle at the very top of the old incision that he had. that showed that it was pointing just above the spinous process of L1.  Using that as a guide, I then infiltrated the right paraspinous musculature and subcutaneous tissues for my proposed incision.  I made the incision after draping the patient sterilely and prepping him sterilely with a #10 blade and took this down to the thoracolumbar fascia.  I then exposed the lamina of what I believe to be L1 and 2.  X-ray showed that the double-ended  ganglion knife was placed inferior to the lamina L and x-ray confirmed that.  I then performed a semihemilaminectomy using a high speed air drill and Kerrison punch at both L1 and L2 on the right side.  The thecal sac was exposed after removing the ligamentum flavum.  The microscope was brought into the operative field and I used that for dissection.  I did remove more of the lateral wall of my exposure using the drill.  I then retracted the thecal sac medially.  A small piece of disk was then observed and I used a pituitary rongeur and was able to tease out what was an extremely large piece of disk material.  I inspected the disk space at that time and I did not feel it was necessary to enter the disk space.  Dr. Lovell Sheehan also inspected the disk space.  The nerve root was free.  There was no other compression present.  I irrigated the wound.  I then reapproximated the thoracolumbar fascia, then subcutaneous tissues both with Vicryl sutures and the skin edges with Vicryl sutures. Dermabond used for sterile dressing.  The patient tolerated the procedure without difficulty. Dictated by:   Mena Goes. Franky Macho, M.D. Attending Physician:  Coletta Memos DD:  08/08/01 TD:  08/12/01 Job: [id number]

## 2010-08-12 NOTE — Assessment & Plan Note (Signed)
[facility]                            CARDIOLOGY OFFICE NOTE   NAME:Anthony Adams, Anthony Adams                         MRN:          [id number]  DATE:06/07/2006                            DOB:          February 14, 1951    PRIMARY CARE PHYSICIAN:  Dhruv Vyas.   HISTORY OF PRESENT ILLNESS:  Anthony Adams is a 60 year old gentleman who  suffered non-ST elevation myocardial infarction in June of 2007.  I  placed 2 drug-eluting stents in his LAD and performed kissing balloon  angioplasty of the second diagonal branch.  Since then, he has done  well.  He has not had any recurrent chest discomfort or exertional  dyspnea.  He has had no PND, orthopnea, claudication, syncope, or  presyncope.   CURRENT MEDICATIONS:  1. Aspirin 325 mg daily.  2. Plavix 75 mg daily.  3. Toprol XL 100 mg daily.  4. Lipitor 80 mg daily.  5. Lisinopril 20 mg daily.   PHYSICAL EXAMINATION:  He is generally well appearing in no distress.  Heart rate 48, blood pressure 132/76, and weight of 246 pounds.  He has no jugular venous distension, thyromegaly or lymphadenopathy.  Lungs are clear to auscultation.  Respiratory effort is normal.  He has a nondisplaced point of maximal cardiac impulse.  There is a  regular rate and rhythm without murmur, rub, or gallop.  The abdomen is soft, nondistended, nontender.  There is no  hepatosplenomegaly.  Bowel sounds are normal.  There is no midline  pulsatile mass or other mass.  No abdominal bruit.  PT pulse is 2+  bilaterally.  Extremities are warm and without edema.   Electrocardiogram demonstrates sinus bradycardia at 48 beats per minute,  and is otherwise normal.   IMPRESSION/RECOMMENDATIONS:  1. Coronary disease.  Doing well after non-ST elevation myocardial      infarction in June 2007.  Ejection fraction 60% at that time.  He      has 2 drug-eluting stents in the left anterior descending.  He had      1 involved at bifurcation to which he had kissing  balloon      angioplasty.  Therefore, continue aspirin and Plavix indefinitely.  2. Hypercholesterolemia.  Managed by Dr. Sherril Croon.  Goal LDL less than 70.  3. Hypertension.  Blood pressure well controlled.   DISPOSITION:  Will arrange for the patient to follow up with Dr. Andee Lineman  in [place] in 1 year's time.     Salvadore Farber, MD  Electronically Signed    WED/MedQ  DD: 06/07/2006  DT: 06/08/2006  Job #: [id number]   cc:   Doreen Beam

## 2010-08-12 NOTE — Assessment & Plan Note (Signed)
[facility]                              CARDIOLOGY OFFICE NOTE   NAME:Anthony Adams, Anthony Adams                         MRN:          [id number]  DATE:12/04/2005                            DOB:          1951-01-14    PRIMARY CARE PHYSICIAN:  Dr. Doreen Beam.   HISTORY OF PRESENT ILLNESS:  Anthony Adams is a 60 year old gentleman who  suffered non-ST elevation myocardial infarction in June 2007.  I placed two  drug eluting stents in his left anterior descending artery and performed  kissing balloon angioplasty of the second diagonal branch.  He represented  just four days later with recurrent chest pain that was felt to be fairly  atypical.  However, given the proximity to his percutaneous intervention, he  underwent repeat catheterization.  That demonstrated wide spread patency of  the stents and diagonal with some small vessel disease.  He was discharged  home on continued medical therapy.   Since discharge, Anthony Adams has generally done very well.  One week ago, he  had an episode of substernal chest pressure recurring at rest that lasted  for less than one minute which was relieved with a single sublingual  nitroglycerin.  He has had no recurrent episodes, and he has had no chest  discomfort with exertion.  He did stop his Imdur shortly after his  hospitalization due to intractable headaches.  He has not had any orthopnea,  PND, exertional dyspnea or claudication.   CURRENT MEDICATIONS:  1. Enteric coated aspirin 325 mg daily.  2. Plavix 75 mg daily.  3. Toprol XL 100 mg daily.  4. Lipitor 80 mg daily.   PHYSICAL EXAMINATION:  GENERAL:  Well-appearing in no distress.  VITAL SIGNS:  Heart rate 50, blood pressure 138/80, weight 241 pounds.  NECK:  No JVD.  No thyromegaly.  LUNGS:  Clear to auscultation.  HEART:  He has a nondisplaced point of maximal cardiac impulses.  He has a  regular rate and rhythm without murmurs, rubs or gallops.  ABDOMEN:  Soft,  nondistended, nontender.  There is no hepatosplenomegaly.  Bowel sounds are normal.  EXTREMITIES:  Warm without cyanosis, clubbing, edema or ulcerations.  Carotid pulses are 2+ bilaterally without bruits.   STUDIES:  Electrocardiogram demonstrates sinus bradycardia at 50 beats per  minute and is otherwise normal.   IMPRESSION/RECOMMENDATIONS:  1. Coronary disease.  Generally doing well after non-ST elevation      myocardial infarction three months ago.  Continue current medical      therapy.  2. Episode of chest pain.  Absence of recurrence and absence of any      exertional symptoms, as well as normal EKG, all argue fairly strongly.      This was not due to myocardial ischemia.  I have asked him to report      back any further episodes.  Will not pursue any additional diagnostic      undertaking now.  3. Hypertension.  Blood pressure elevated today.  Continue beta blocker.      Add Lisinopril 20 mg daily.  Will  check renal function in two weeks      time.  4. Hypercholesterolemia.  Tolerating Statin.  Check lipids and LFT's in      two weeks.                                 Salvadore Farber, MD    WED/MedQ  DD:  12/04/2005  DT:  12/04/2005  Job #:  [id number]   cc:   Doreen Beam

## 2010-08-12 NOTE — Discharge Summary (Signed)
Anthony Adams, Anthony Adams NO.:  [id number]   MEDICAL RECORD NO.:  [id number]          PATIENT TYPE:  INP   LOCATION:  6526                         FACILITY:  [facility]   PHYSICIAN:  Salvadore Farber, M.D. LHCDATE OF BIRTH:  [birth year]   DATE OF ADMISSION:  08/27/2005  DATE OF DISCHARGE:  08/29/2005                                 DISCHARGE SUMMARY   PRIMARY CARDIOLOGIST:  Dr. Randa Evens (new).   PRINCIPAL DIAGNOSES:  1.  Non-ST elevation myocardial infarction.      1.  Status post stenting (bare-metal) left anterior descending and          diagonal artery August 28, 2005.  2.  Normal left ventricular function.  3.  Diarrhea.   SECONDARY DIAGNOSES:  1.  Hypertension.  2.  Dyslipidemia.  3.  Remote tobacco.   PROCEDURES:  Coronary angiography/bare-metal stenting of the mid-left  anterior descending artery (x2) and balloon angioplasty of second diagonal  artery, by Dr. Randa Evens, on June 4th.   REASON FOR ADMISSION:  Mr. Pokorski is a 60 year old male, with no prior cardiac  history, but with multiple cardiac risk factors, who initially presented to  [facility] with chest pain.  Initial cardiac enzymes were negative  but the patient did report relief with nitroglycerin.  Arrangements were  thus made for subsequent transfer and further evaluation.   HOSPITAL COURSE:  Following transfer, serial cardiac markers were abnormal  and consistent with non-ST elevation myocardial infarction (peak CPK 0.19).   The patient was thus started on Lovenox and recommendation was to proceed  with cardiac catheterization.   Procedure was performed by Dr. Samule Ohm (see report for full details) and  notable for significant two-vessel coronary artery disease with normal left  ventricular function.   Following successful percutaneous intervention, the patient was kept for  overnight observation and cleared for discharge the following morning, by  Dr. Samule Ohm, in  hemodynamically stable condition.  There were no noted  complications of the right groin incision site.   Of note, the patient did develop diarrhea requiring treatment with Lomotil.  It was speculated that this could be due to the Plavix load.  The patient  will be monitored closely on maintenance Plavix for any recurrent symptoms.  Of note, Protonix was also discontinued.   DISCHARGE LABS:  WBC 9, hematocrit 41, platelet 139.  Sodium 140, potassium  3.8, glucose 133, BUN 8, creatinine 0.8. CPK 37/3.9 and troponin 0.39 (post  PCI).  Outstanding labs serial cardiac enzymes notable for peak CPK 0.19.  Lipid profile:  Total cholesterol 221, triglyceride 356, HDL 26 and LDL 124.  TSH 2.19.  Hemoglobin/hematocrit and WBCs normal with platelets of 147 on  admission.  Hemoglobin A1c 5.4.   DISCHARGE MEDICATIONS:  1.  Plavix 75 mg daily (x1 year).  2.  Coated aspirin 325 mg daily.  3.  Lipitor 80 mg daily.  4.  Toprol XL 75 mg daily.  5.  Nitrostat 0.4 mg as directed.   INSTRUCTIONS:  1.  The patient is referred to cardiac catheterization discharge sheet  for      full instructions.  2.  The patient is cleared to return to work in approximately two weeks.   FOLLOW-UP:  Dr. Randa Evens, [facility] on Tuesday, September 12, 2005 at 9:45 a.m.   DISPOSITION:  Stable.   DISCHARGE DURATION:  35 minutes.      Rozell Searing, P.A. [facility]      Salvadore Farber, M.D. [facility]  Electronically Signed    GS/MEDQ  D:  08/29/2005  T:  08/29/2005  Job:  [id number]

## 2010-08-12 NOTE — Cardiovascular Report (Signed)
NAME:  Anthony Adams, Anthony Adams NO.:  [id number]   MEDICAL RECORD NO.:  [id number]          PATIENT TYPE:  INP   LOCATION:  6526                         FACILITY:  [facility]   PHYSICIAN:  Salvadore Farber, M.D. LHCDATE OF BIRTH:  15-Aug-1950   DATE OF PROCEDURE:  08/28/2005  DATE OF DISCHARGE:  08/29/2005                              CARDIAC CATHETERIZATION   PROCEDURE:  Left heart catheterization, left ventriculography, coronary  angiography, balloon angioplasty of the second diagonal branch, bare metal  stenting to two separate lesions of the mid left anterior descending (not  overlapping), AngioSeal closure of the right common femoral arteriotomy  site.   CARDIOLOGIST:  Salvadore Farber, M.D.   INDICATIONS:  Mr. Wingert is a 60 year old gentleman with hypertension and  dyslipidemia who presents with non-ST elevation myocardial infarction.  Troponin is mildly elevated at 0.19 after presentation with a chest  discomfort.  He is referred for diagnostic angiography and possible  percutaneous coronary intervention.   PROCEDURE TECHNIQUE:  Informed consent was obtained.  Under 1% lidocaine  local anesthesia, a 5-French sheath was placed in the right common femoral  artery using modified Seldinger technique.  Diagnostic angiography and  ventriculography were performed using JL-4, JR-4, and pigtail catheters.  This demonstrated a focal 80% stenosis of the mid-LAD, a long 90% stenosis  of a large second diagonal branch, and a focal 95% stenosis of the posterior  left ventricular branch and diffuse disease of another posterior left  ventricular branch.  Left ventricular systolic function was preserved.  It  was thus unclear what the culprit lesion was.  The diagonal appeared to be  the most likely culprit.  We decided to proceed to the balloon angioplasty  of this diagonal with stenting of the two lesions in the LAD and attempt at  medical therapy of the RCA disease given the small  diameter of the vessels  in the right.   Anticoagulation was initiated with CHAMPION study drugs, 0.3 mg/kg, IV  enoxaparin bolus (the patient had last recent the received subcutaneous  Lovenox 8 hours prior), and double bolus eptifibatide.  A CLS-4 guide was  advanced over wire and engaged in the ostium of the left main.  A Prowater  wire was advanced to the distal LAD without difficulty.  I began by  predilating the LAD lesions using a 2.0 x 9 mm Maverick at 6 atmospheres for  one inflation to each lesion.  I then administered intracoronary  nitroglycerin.  The vessels remained small.  I then proceeded to stenting of  the more distal lesion using a 2.0 x 12 mm mini-Vision deployed at 12  atmospheres.  I then stented the more proximal lesion using a 2.25 x 15 mm  mini Vision at 12 atmospheres.  I then post dilated the more distal stenosis  using a 2.25 x 15 mm Quantum at 14 atmospheres and the proximal stenosis at  2.5 using a 2.5 x 15 mm Quantum at 14 atmospheres.  Repeat angiography  demonstrated no residual stenosis, no dissection, and TIMI-3 flow to the  distal vasculature.  I then turned attention to the diagonal.  I redirected the Prowater into the  diagonal and advanced it distally to the lesion without difficulty.  I then  proceeded to a balloon angioplasty using a 2.0 x 30 mm Maverick which I  inflated to 6 atmospheres for two inflations each of 2 minutes.  Final  angiography demonstrated less than 10% residual stenosis and no dissection  with TIMI-3 flow.   The arteriotomy was then closed using a Star close device.  Complete  hemostasis was obtained.  He was then transferred to the holding room in  stable condition.   COMPLICATIONS:  None.   FINDINGS:  1.  LV:  164/15/25.  EF 60% without regional wall motion abnormality.  2.  Left main:  Angiographically normal.  3.  LAD:  Fairly large vessel giving rise to a small first and larger second      diagonal branch.  The  mid-LAD just after the diagonal had 80% stenosis      stented to no residual.  Approximately 15 mm further downstream there      was an additional 80% stenosis of also stented to no residual.  The      apical LAD is diffusely diseased throughout its length with greater than      90% stenosis.  The second diagonal branch had a long 90% stenosis      treated with balloon angioplasty to less than 10% residual.  4.  Ramus intermedius:  Angiographically normal.  5.  Circumflex:  Small vessel giving rise to a single small moderate size      marginal.  It is angiographically normal.  6.  RCA:  Large dominant vessel.  There is a 20% stenosis of the midvessel.      There are two posterior left ventricular branches.  The first branch has      a focal 95% stenosis in a 2 mm vessel.  The second branch is diffusely      diseased with serial 80% stenosis.   IMPRESSION/PLAN:  Successful bare metal stenting of two lesions in the LAD  and balloon angioplasty of the diagonal.  There remains severe stenosis into  two posterior left ventricular branches of the RCA as well as severe diffuse  disease of the apical LAD.  These are all small diameter.  We will attempt  medical management.      Salvadore Farber, M.D. [facility]  Electronically Signed     WED/MEDQ  D:  08/28/2005  T:  08/29/2005  Job:  [id number]

## 2010-08-12 NOTE — Cardiovascular Report (Signed)
NAMEBLAYDEN, Anthony Adams                  ACCOUNT NO.:  [id number]   MEDICAL RECORD NO.:  [id number]          PATIENT TYPE:  INP   LOCATION:  2030                         FACILITY:  [facility]   PHYSICIAN:  Rollene Rotunda, M.D.   DATE OF BIRTH:  18-Nov-1950   DATE OF PROCEDURE:  09/01/2005  DATE OF DISCHARGE:                              CARDIAC CATHETERIZATION   PRIMARY CARE PHYSICIAN:  Doreen Beam, MD.   PROCEDURE:  Coronary angiography.   PROCEDURAL NOTE:  Coronary angiography was performed via the right femoral  artery.  The artery was cannulated using an anterior wall puncture.  A #6-  French arterial sheath was inserted via the modified Seldinger technique.  Pre-formed Judkins catheters were utilized.  The patient tolerated the  procedure well and left the lab in stable condition.   INDICATION:  Chest pain, having had recent stents to his LAD.   RESULTS:  Left main normal, the LAD had diffuse luminal irregularities.  There were tandem mid stents, which were widely patent.  They were not  stents opposed on each other.  There was apical, subtotal stenosis, which  was unchanged from his recent catheterization.  The first diagonal was large  with luminal irregularities.  The second diagonal was large with diffuse 30%  stenosis at a previous PTCA site.  The circumflex in the AV groove was  small.  It was free of high-grade disease.  The ramus intermedius was large  with mid 25% stenosis.  The first obtuse marginal was large with diffuse  luminal irregularities.  The right coronary artery was large and dominant.  There was a long, mid, 25% stenosis.  The PDA was large with diffuse 30%  stenosis.  The posterolateral was small with mid 99% stenosis.  The  posterolateral-2 was moderate sized with diffuse 80% stenosis.  Left  ventricle:  The left ventricle was not injected.   CONCLUSION:  Diffuse, high-grade, small vessel disease.  Patent left  anterior descending stents.  Patent percutaneous  transluminal coronary  angioplasty site in the diagonal.   PLAN AT THIS POINT:  The patient needs to continue with aggressive medical  management.  No further evaluation is warranted.  I will add nitroglycerin  to his regimen.  He can have his other antianginals maximized.  We could  consider Rinexa going forward.           ______________________________  Rollene Rotunda, M.D.    JH/MEDQ  D:  09/01/2005  T:  09/02/2005  Job:  [id number]   cc:   Doreen Beam  Fax: [phone]   [facility] [place]

## 2010-08-12 NOTE — H&P (Signed)
NAMEDRAYK, HUMBARGER NO.:  [id number]   MEDICAL RECORD NO.:  [id number]          PATIENT TYPE:  INP   LOCATION:  5504                         FACILITY:  [facility]   PHYSICIAN:  Lorain Childes, M.D. LHCDATE OF BIRTH:  January 16, 1951   DATE OF ADMISSION:  08/27/2005  DATE OF DISCHARGE:                                HISTORY & PHYSICAL   CHIEF COMPLAINT:  Chest pain.   HISTORY OF PRESENT ILLNESS:  The patient is a very pleasant 60 year old  gentleman with history of hypertension, dyslipidemia and a family history of  CAD who presents to [facility] for evaluation of chest pain.  The  patient reports the chest pain began Thursday evening awakening him from  sleep.  He described it as a tightness in his chest.  Initially, he thought  it was GI in origin as it radiated down his ribs.  He took Tagamet with  minimal relief.  He reports that his symptoms came and went throughout  Thursday evening and off on Friday and then resolved.  On Saturday, he felt  okay.  However, today around 1 p.m., he began experiencing chest pain which  he rated as a 6/10.  It was described as a heaviness radiating to his left  arm and also down his fingers.  He also reports shortness of breath and  diaphoresis.  He had no nausea or vomiting but did report palpitations.  He  came to [facility] Emergency Room for further evaluation.  There he  received sublingual nitroglycerin which resolved his pain completely.  In  the ER, he was monitored on telemetry.  The monitor noted frequent PVCs with  episodes of bigeminy and trigeminy.  This resolved after he was started on  oxygen and given Lopressor.  He was also given Lovenox and Dr. ___________  called and asked for transfer.  He denies prior pain prior to these episodes  occurring on Thursday.   PAST MEDICAL HISTORY:  1.  Hypertension on oral agent; however, he does not remember the name of      this pill.  2.  Dyslipidemia for which  he is managed with his diet.  3.  Status post laminectomy and discectomy in his lumbar spine.  4.  Status post appendectomy.   MEDICATIONS:  A blood pressure pill for which he does not remember the name.   ALLERGIES:  He has no known drug allergies.   SOCIAL HISTORY:  He lives in [place].  He is a Scientist, product/process development.  He has a 30-  pack-year history of tobacco; however, he quit 20 years ago.  He denies any  alcohol for the past 10 years.  He denies any drug use. Herbal medication  use:  He takes Megaman over 50 daily.  He also uses Vein-Ease for leg  cramping.  He follows a regular diet.  He does not do any regular exercises.   FAMILY HISTORY:  His mother is alive at the age of 71.  She has a heart  murmur.  Father died at the age of 76 from CAD.  He had his first MI in his  late 35s.  Siblings:  He has three brothers and three sisters.  There is no  history of early coronary disease or sudden cardiac death.   REVIEW OF SYSTEMS:  He denies any fevers, chills or weight changes.  No  headaches except related to the nitroglycerin or visual changes.  No skin  rashes or lesions.  He reports chest pain as stated in the HPI and shortness  of breath.  He denies any dyspnea on exertion, orthopnea, PND, lower  extremity edema.  He does report palpitations since Thursday evening.  He  denies any syncope.  He describes claudication symptoms in his legs.  He  denies coughing or wheezing.  No urinary symptoms.  No hematuria.  He denies  any diarrhea, bright red blood per rectum.  No melena.  No change in his  bowel habits.  All other systems are negative.   PHYSICAL EXAMINATION:  VITAL SIGNS:  Temperature 97.0, pulse 67,  respirations 16, blood pressure 121/67.  He is saturating 98% on two liters  nasal cannula.  GENERAL:  He is a pleasant man comfortable in no acute distress.  HEENT:  Normocephalic and atraumatic.  Oropharynx was clear.  NECK:  Supple.  JVD is approximately 6 cm.  He has 2+ carotid  upstroke.  There are no bruits.  LUNGS:  Clear to auscultation bilaterally with no wheezing, rales or  rhonchi.  CARDIOVASCULAR:  S1 split S2.  Regular rate and rhythm.  He has no murmurs  appreciated.  His PMI is nondisplaced.  Pulses are 2+ throughout.  He has no  femoral bruits present.  ABDOMEN:  Obese.  Positive bowel sounds.  Nontender.  No organomegaly.  EXTREMITIES:  He has no edema.  He has 2+ distal pulses.  NEUROLOGICAL:  Nonfocal.   Chest x-ray shows no acute disease from the outside hospital.  EKG shows  rate of 105, sinus rhythm, left axis deviation.  His PR interval is 105, QRS  is 81 and QTC is 449.  He has frequent PVCs noted.  He has no ST changes.  He has no prior EKG for comparison.   LABORATORY DATA:  White count 5.2, hematocrit 46.3, platelet count 176.  Potassium is 3.6, creatinine is 0.8, glucose 178, BNP is 6.  CK-MB is 1.7,  troponin 0.04.  His coagulases are normal.   ASSESSMENT:  The patient is a 60 year old gentleman with no known coronary  disease but significant cardiac risk factors who presents with progressive  chest pain.   PLAN:  1.  CAD:  His symptoms are concerning for angina.  He has multiple cardiac      risk factors.  We will admit him on telemetry, cycle his cardiac enzymes      and follow his EKG.  We will continue aspirin.  We will start him      metoprolol 25 mg p.o. q.12h. and continue him on Lovenox.  I will start      him on Lipitor and check a fasting lipid panel.  He denies any current      tobacco for the past 20 years which we continue to encourage.  We will      plan for a diagnostic cardiac catheterization tomorrow.  2.  Hypertension:  He is on an unknown agent.  Restarting blood pressure      medicines as described above.  We will continue these and titrate these      as needed.  3.  Dyslipidemia:  I am checking a fasting lipid panel and we will start him      on Lipitor.  We can adjust the dose as needed.           ______________________________  Lorain Childes, M.D. [facility]     CGF/MEDQ  D:  08/27/2005  T:  08/28/2005  Job:  [id number]

## 2010-08-12 NOTE — H&P (Signed)
NAME:  Anthony Adams, NEPHEW NO.:  [id number]   MEDICAL RECORD NO.:  [id number]          PATIENT TYPE:  INP   LOCATION:  1826                         FACILITY:  [facility]   PHYSICIAN:  Pricilla Riffle, M.D.    DATE OF BIRTH:  10/20/1950   DATE OF ADMISSION:  09/01/2005  DATE OF DISCHARGE:                                HISTORY & PHYSICAL   IDENTIFICATION:  Anthony Adams is a 60 year old gentleman recently discharged  from [facility] who presents now with chest pain.   HISTORY OF PRESENT ILLNESS:  The patient was recently admitted with a non-Q-  wave MI (Jul 27, 2005).  He underwent catheterization and had two bare-metal  stents placed in the LAD (mid); also, a PTCA of the diagonal.  He had a  residual 90% LAD lesion, also RCA with two PLSA branches with 95% in one,  80% in the other.  Had small vessels, planned for medical therapy.   The patient felt good yesterday.  He felt good yesterday evening.  He went  to bed.  He woke up this morning and developed chest pressure in the mid  sternum, rated as a 6/10 in intensity.  He tried to take his medicines  thinking it would help, and also eat a banana.  This did not, and he came to  the emergency room.  Overall he had chest pressure, 6/10, for about an hour.  Given nitroglycerin in the emergency room with relief.   He said prior to his MI he had more of a burning sensation in his chest and  left arm.   He denies reflux, no recent illness.   ALLERGIES:  None.   MEDICATIONS:  1.  Plavix 75 daily.  2.  Aspirin 325 daily.  3.  Lipitor 80 q.h.s.  4.  Toprol-XL 75 daily.  5.  Nitroglycerin p.r.n. (did not take).   PAST MEDICAL HISTORY:  1.  Coronary artery disease.  2.  Hypertension.  3.  Increased cholesterol.  4.  Degenerative disc disease.  5.  Status post appendectomy.   SOCIAL HISTORY:  The patient lives in [place], quit tobacco 20 years ago after  a 30 pack-year, does not drink.   FAMILY HISTORY:  Father died of CAD in  his 37s, had an MI in his 61s.   REVIEW OF SYSTEMS:  All systems reviewed.  Note recent diarrhea.  Otherwise,  negative to the above problem except as noted.   PHYSICAL EXAMINATION:  GENERAL:  On exam the patient currently is without  chest pain.  VITAL SIGNS:  Blood pressure is 128/73, pulse is 93 and regular, temperature  is 98.1, O2 saturation on room air is 96.  Note telemetry sinus rhythm with  PVCs/couplets.  HEENT:  Normocephalic, atraumatic.  PERRL.  EOMI.  NECK:  No JVD, no bruits.  CARDIAC:  Regular rate and rhythm, S1, S2.  No S3.  No significant murmurs,  no S4.  LUNGS:  Clear to auscultation.  ABDOMEN:  Supple, nontender, no hepatomegaly.  EXTREMITIES:  Show 2+ pulses throughout, no lower extremity edema.  MUSCULOSKELETAL:  No discrete abnormalities.  NEUROLOGIC:  Alert and oriented x3.  Cranial nerves II-XII intact.  Motor  5/5 throughout.   Chest x-ray shows no acute disease.  A 12-lead EKG:  Normal sinus rhythm, 88  beats per minute.  Telemetry as noted.  Labs significant for a hemoglobin of  13.8, WBC of 7.1, platelets of 166, INR 1.1.  Other labs pending.   IMPRESSION:  The patient is a 60 year old with recent intervention to the  LAD and diagonal.  He had chest pain and ruled in for a non-Q-wave MI just a  few days ago.  His pain today is different but very worrisome for angina.  He is now pain-free.  Telemetry, though, shows continued ventricular ectopy.   I have discussed this with the patient.  I would recommend a re-look  catheterization to define his anatomy.  Continue on medicines for now and  note he has already been begun on heparin and nitroglycerin.           ______________________________  Pricilla Riffle, M.D.     PVR/MEDQ  D:  09/01/2005  T:  09/01/2005  Job:  [id number]

## 2010-08-12 NOTE — Discharge Summary (Signed)
NAMEMCKENNA, BORUFF NO.:  [id number]   MEDICAL RECORD NO.:  [id number]          PATIENT TYPE:  INP   LOCATION:  2030                         FACILITY:  [facility]   PHYSICIAN:  Willa Rough, M.D.     DATE OF BIRTH:  Jun 09, 1950   DATE OF ADMISSION:  09/01/2005  DATE OF DISCHARGE:  09/02/2005                                 DISCHARGE SUMMARY   REASON FOR ADMISSION:  Chest pain.   DISCHARGE DIAGNOSES:  1.  Coronary artery disease.      1.  Status post non-ST elevation myocardial infarction on August 27, 2005.          Treated with a bare metal stent to the left anterior descending          artery x2 and angioplasty to the diagonal.      2.  Re-look catheterization this admission revealing small vessel          disease, medical management planned.      3.  Good left ventricular function.  2.  Treated hypertension.  3.  Treated hypercholesterolemia.   PROCEDURE PERFORMED THIS ADMISSION:  Cardiac catheterization by Dr. Antoine Poche  on September 01, 2005.  Please see the dictated note for complete details.  Briefly, the tandem stents in the LAD were patent.  There was diffuse 30%  stenosis in the second diagonal at the angioplasty site, mid 25% stenosis in  the ramus intermedius, long mid 25% stenosis in the RCA, PDA with diffuse  30% stenosis, posterolateral mid 99% stenosis in the second posterolateral  with diffuse 80% stenosis.   HISTORY:  Mr. Bonnette is a 60 year old male patient admitted on September 01, 2005  with complaints of chest discomfort.  These were different from his prior MI  pain, but with recent intervention, this was concerning.  He was admitted  for further evaluation.   HOSPITAL COURSE:  The patient was admitted for further evaluation.  His CK-  MBs remained negative.  His troponin I's continued to trend down to 0.07,  0.07, 0.06.  He was taken to the catheterization lab.  The results are noted  above.  He tolerated the procedure well with no immediate  complications.  Medical management was planned.  His Toprol was increased to 100 mg a day.  Imdur 60 mg a day was added.  Dr. Myrtis Ser saw the patient on September 02, 2005 and  thought he was ready for discharge to home.  He will follow up as scheduled  with Dr. Melinda Crutch physician's assistant in a couple of weeks.   LABS:  Cardiac enzymes, as noted above.  White count 8600, hemoglobin 13.5,  hematocrit 39.3, platelet count 164,000.  INR 1.1.  Sodium 136, potassium  3.9, chloride 101, CO2 27, glucose 120, BUN 11, creatinine 0.9.  Calcium  9.1.  Total bilirubin 1.4.  Alkaline phosphatase 52, AST 37, ALT 47, total  protein 6.9, albumin 3.9, magnesium 2.2.   CHEST X-RAY:  Cardiomegaly, bibasilar streaky atelectasis or scar.  Doubt  active disease.   DISCHARGE MEDICATIONS:  1.  Coated aspirin 325  mg daily.  2.  Plavix 75 mg daily.  3.  Toprol XL 100 mg daily.  4.  Lipitor 80 mg daily.  5.  Nitroglycerin p.r.n. chest pain.  6.  Imdur 60 mg daily.   DIET:  Low fat, low sodium.   ACTIVITY:  No driving, heavy lifting, or sexual activity x3 days.  He can  return to work after June 19th.   WOUND CARE:  He is to call our office for any groin swelling, bleeding,  bruising, or fever.   FOLLOW UP:  With the physician's assistant for Dr. Samule Ohm on June 19th at  9:45 in the morning.   Total PA and MD time on this discharge is greater than 30 minutes.      Tereso Newcomer, P.A.    ______________________________  Willa Rough, M.D.    SW/MEDQ  D:  09/02/2005  T:  09/03/2005  Job:  [id number]   cc:   Doreen Beam  Fax: [phone]

## 2015-05-26 HISTORY — PX: CORONARY ARTERY BYPASS GRAFT: SHX141

## 2015-06-14 ENCOUNTER — Inpatient Hospital Stay: Admission: AD | Admit: 2015-06-14 | Payer: Self-pay | Source: Other Acute Inpatient Hospital | Admitting: Cardiology

## 2015-06-15 DIAGNOSIS — I214 Non-ST elevation (NSTEMI) myocardial infarction: Secondary | ICD-10-CM | POA: Insufficient documentation

## 2015-08-12 ENCOUNTER — Encounter (HOSPITAL_COMMUNITY): Admission: RE | Admit: 2015-08-12 | Payer: Medicare Other | Source: Ambulatory Visit

## 2015-08-16 DIAGNOSIS — Z951 Presence of aortocoronary bypass graft: Secondary | ICD-10-CM | POA: Insufficient documentation

## 2016-02-07 ENCOUNTER — Ambulatory Visit: Payer: Medicare Other | Admitting: Cardiovascular Disease

## 2016-02-07 NOTE — Progress Notes (Signed)
Cardiology Office Note   Date:  02/07/2016   ID:  Anthony Adams, DOB 1950-05-14, MRN [id number]  PCP:  Anthony Specking, MD  Cardiologist:   Charlton Haws, MD   No chief complaint on file.     History of Present Illness: Anthony Adams is a 65 y.o. male who presents for evaluation of CAD.  Previously seen by Dr Myrtis Ser Distant history  Of BMS LAD and PCI Diagonal in 2007 Last cath March 2012 with patent stent small D3 with 80% lesion Rx medically  Lost to f/u due to insurance issues. Admitted to [facility] with SEMI and subsequently had cath and CABG x 5 by Dr Anthony Adams. I cannot find CABG report in careeverywhere.  Done 06/17/15  Note made of mildly decreased LV function On d/c seen by [place]. Doing well no pain and much less dyspnea. Unaware of his ambient PVCls.  Retired From [employer]. Compliant with meds Done with cardiac rehab    Past Medical History:  Diagnosis Date  . Back pain    Recent injection with aspirin and Plavix held, patient had chest pain that day  . Blood glucose elevated    March, 2012  . CAD (coronary artery disease)     Chest pain while off Plavix, March, 2012, catheterization, LAD stents patent, worsening third diagonal 80%-use PCI only for recurrent significant symptoms /  ..NSTEMI..[id number] DES LAD (kissing balloon)  . CAD (coronary artery disease)    plavix indefinitly  . CAD (coronary artery disease)    cardiac clearance .Anthony Adams.for back surgery...october 2011  . Dyslipidemia   . Gout    April, 201 to  . Hypertension    EF 50-55%, borderline anterior hypokinesis, catheterization March, 2012  . SOB (shortness of breath)     No past surgical history on file.   Current Outpatient Prescriptions  Medication Sig Dispense Refill  . amLODipine (NORVASC) 2.5 MG tablet Take 1 tablet (2.5 mg total) by mouth 2 (two) times daily. 60 tablet 6  . aspirin 325 MG tablet Take 325 mg by mouth daily.      Anthony Adams atorvastatin (LIPITOR) 80 MG tablet Take 80 mg by mouth daily.       . chlorthalidone (HYGROTON) 25 MG tablet Take 25 mg by mouth daily.      . clopidogrel (PLAVIX) 75 MG tablet Take 75 mg by mouth daily.      Anthony Adams COLCRYS 0.6 MG tablet Take 0.6 mg by mouth daily.     Anthony Adams esomeprazole (NEXIUM) 40 MG packet Take 40 mg by mouth daily before breakfast.      . gabapentin (NEURONTIN) 600 MG tablet Take 600 mg by mouth 3 (three) times daily.      Anthony Adams HYDROcodone-acetaminophen (NORCO) 5-325 MG per tablet Take 1 tablet by mouth every 6 (six) hours as needed.      Anthony Adams lisinopril (PRINIVIL,ZESTRIL) 20 MG tablet Take 0.5 tablets (10 mg total) by mouth daily.    . metoprolol (TOPROL-XL) 100 MG 24 hr tablet Take 100 mg by mouth daily.      . nitroGLYCERIN (NITROSTAT) 0.4 MG SL tablet Place 0.4 mg under the tongue every 5 (five) minutes as needed.      . pantoprazole (PROTONIX) 40 MG tablet Take 40 mg by mouth daily.      No current facility-administered medications for this visit.     Allergies:   Patient has no known allergies.    Social History:  The patient  reports that he  quit smoking about 42 years ago. His smoking use included Cigarettes. He has a 30.00 pack-year smoking history. He quit smokeless tobacco use about 5 years ago. His smokeless tobacco use included Snuff. He reports that he does not drink alcohol or use drugs.   Family History:  The patient's family history includes Coronary artery disease in his father.    ROS:  Please see the history of present illness.   Otherwise, review of systems are positive for none.   All other systems are reviewed and negative.    PHYSICAL EXAM: VS:  There were no vitals taken for this visit. , BMI There is no height or weight on file to calculate BMI. Affect appropriate Healthy:  appears stated age HEENT: normal Neck supple with no adenopathy JVP normal no bruits no thyromegaly Lungs clear with no wheezing and good diaphragmatic motion Heart:  S1/S2 no murmur, no rub, gallop or click post sternotomy well healed  PMI  normal Abdomen: benighn, BS positve, no tenderness, no AAA no bruit.  No HSM or HJR Distal pulses intact with no bruits No edema Neuro non-focal Skin warm and dry No muscular weakness Venectomy right leg well healed    EKG:  Oct 2011 SR rate 80 PVC otherwise normal  02/08/16  SR Possible old IMI PVC;s    Recent Labs: No results found for requested labs within last 8760 hours.    Lipid Panel    Component Value Date/Time   CHOL (H) 05/28/2010 0655    233        ATP III CLASSIFICATION:  <200     mg/dL   Desirable  998-338  mg/dL   Borderline High  >=250    mg/dL   High          TRIG 539 (H) 05/28/2010 0655   HDL 29 (L) 05/28/2010 0655   CHOLHDL 8.0 05/28/2010 0655   VLDL 43 (H) 05/28/2010 0655   LDLCALC (H) 05/28/2010 0655    161        Total Cholesterol/HDL:CHD Risk Coronary Heart Disease Risk Table                     Men   Women  1/2 Average Risk   3.4   3.3  Average Risk       5.0   4.4  2 X Average Risk   9.6   7.1  3 X Average Risk  23.4   11.0        Use the calculated Patient Ratio above and the CHD Risk Table to determine the patient's CHD Risk.        ATP III CLASSIFICATION (LDL):  <100     mg/dL   Optimal  767-341  mg/dL   Near or Above                    Optimal  130-159  mg/dL   Borderline  937-902  mg/dL   High  >409     mg/dL   Very High      Wt Readings from Last 3 Encounters:  07/04/10 (!) 103 kg (227 lb)  01/03/10 (!) 105.7 kg (233 lb)  06/17/09 (!) 102.5 kg (226 lb)      Other studies Reviewed: Additional studies/ records that were reviewed today include: Notes Dr Myrtis Ser and cath films 2011 and 2012 Notes Primary Dr Sherril Croon in [place].Notes [facility] CABG and Dr Molly Maduro     ASSESSMENT AND PLAN:  1.  CAD Post CABG at [facility] 06/17/15  Continue ASA and beta blocker. Check post op echo Given mildly decreased EF preop and 24 hour holter for PVC;s  2. Chol  Continue statin labs with primary  3. HTN: Well controlled.  Continue current  medications and low sodium Dash type diet.   4. GERD low carb diet H2 blocker    Current medicines are reviewed at length with the patient today.  The patient does not have concerns regarding medicines.  The following changes have been made:  no change  Labs/ tests ordered today include: Holter Echo  No orders of the defined types were placed in this encounter.    Disposition:   FU with Dr Darl Householder in [place] 3 months      Signed, Charlton Haws, MD  02/07/2016 10:14 AM    [facility] [address] [place], [place], [place]  [address] Phone: [phone]; Fax: [phone]

## 2016-02-08 ENCOUNTER — Encounter: Payer: Self-pay | Admitting: Cardiovascular Disease

## 2016-02-08 ENCOUNTER — Ambulatory Visit (INDEPENDENT_AMBULATORY_CARE_PROVIDER_SITE_OTHER): Payer: Medicare Other | Admitting: Cardiovascular Disease

## 2016-02-08 VITALS — BP 134/70 | HR 94 | Ht 73.0 in | Wt 237.0 lb

## 2016-02-08 DIAGNOSIS — I255 Ischemic cardiomyopathy: Secondary | ICD-10-CM

## 2016-02-08 DIAGNOSIS — E785 Hyperlipidemia, unspecified: Secondary | ICD-10-CM

## 2016-02-08 NOTE — Patient Instructions (Signed)
Medication Instructions:  Your physician recommends that you continue on your current medications as directed. Please refer to the Current Medication list given to you today.   Labwork: NONE  Testing/Procedures: Your physician has recommended that you wear a holter monitor. Holter monitors are medical devices that record the heart's electrical activity. Doctors most often use these monitors to diagnose arrhythmias. Arrhythmias are problems with the speed or rhythm of the heartbeat. The monitor is a small, portable device. You can wear one while you do your normal daily activities. This is usually used to diagnose what is causing palpitations/syncope (passing out).  Your physician has requested that you have an echocardiogram. Echocardiography is a painless test that uses sound waves to create images of your heart. It provides your doctor with information about the size and shape of your heart and how well your heart's chambers and valves are working. This procedure takes approximately one hour. There are no restrictions for this procedure.    Follow-Up: Your physician recommends that you schedule a follow-up appointment in: 3 MONTHS with Dr. Purvis Sheffield in [place]   Any Other Special Instructions Will Be Listed Below (If Applicable).     If you need a refill on your cardiac medications before your next appointment, please call your pharmacy.

## 2016-02-21 ENCOUNTER — Ambulatory Visit (HOSPITAL_COMMUNITY)
Admission: RE | Admit: 2016-02-21 | Discharge: 2016-02-21 | Disposition: A | Discharge status: Home / Self Care | Payer: Medicare Other | Source: Ambulatory Visit | Attending: Cardiovascular Disease | Admitting: Cardiovascular Disease

## 2016-02-21 DIAGNOSIS — E785 Hyperlipidemia, unspecified: Secondary | ICD-10-CM | POA: Diagnosis not present

## 2016-02-21 DIAGNOSIS — I10 Essential (primary) hypertension: Secondary | ICD-10-CM | POA: Diagnosis not present

## 2016-02-21 DIAGNOSIS — I2510 Atherosclerotic heart disease of native coronary artery without angina pectoris: Secondary | ICD-10-CM | POA: Diagnosis not present

## 2016-02-21 DIAGNOSIS — I255 Ischemic cardiomyopathy: Secondary | ICD-10-CM

## 2016-02-21 DIAGNOSIS — I340 Nonrheumatic mitral (valve) insufficiency: Secondary | ICD-10-CM | POA: Diagnosis not present

## 2016-02-21 DIAGNOSIS — Z87891 Personal history of nicotine dependence: Secondary | ICD-10-CM | POA: Insufficient documentation

## 2016-02-21 DIAGNOSIS — Z951 Presence of aortocoronary bypass graft: Secondary | ICD-10-CM | POA: Diagnosis not present

## 2016-03-01 ENCOUNTER — Ambulatory Visit (INDEPENDENT_AMBULATORY_CARE_PROVIDER_SITE_OTHER): Payer: Medicare Other | Admitting: Internal Medicine

## 2016-03-01 ENCOUNTER — Telehealth: Payer: Self-pay

## 2016-03-01 ENCOUNTER — Encounter: Payer: Self-pay | Admitting: Internal Medicine

## 2016-03-01 VITALS — BP 128/74 | HR 66 | Ht 73.0 in | Wt 243.0 lb

## 2016-03-01 DIAGNOSIS — Z79899 Other long term (current) drug therapy: Secondary | ICD-10-CM

## 2016-03-01 DIAGNOSIS — I493 Ventricular premature depolarization: Secondary | ICD-10-CM

## 2016-03-01 MED ORDER — LOSARTAN POTASSIUM 25 MG PO TABS: 25.0000 mg | ORAL_TABLET | Freq: Every day | ORAL | 6 refills | Status: DC

## 2016-03-01 MED ORDER — CARVEDILOL 6.25 MG PO TABS: 6.2500 mg | ORAL_TABLET | Freq: Two times a day (BID) | ORAL | 6 refills | Status: DC

## 2016-03-01 NOTE — Telephone Encounter (Signed)
-----   Message from Wendall Stade, MD sent at 02/29/2016  4:51 PM EST ----- EF 20% with 15% PVC;s on holter more than 6 months post CABG needs EP referral to consider AICD

## 2016-03-01 NOTE — Telephone Encounter (Signed)
Referral placed, will call pt to inform him that he has been referred to EP.

## 2016-03-01 NOTE — Progress Notes (Signed)
HPI Anthony Adams is referred today for evaluation of LV dysfunction. He is a pleasant 65 yo man with longstanding CAD who was admitted to [facility] with NSTEM, underwent CABG several months ago. He is only on metoprolol. Previously he had mostly normal LV function. He has class 2 dyspnea and was seen by Dr. Eden Emms and found to have an EF by echo of 20%. He had a holter which demonstrated PVC's and NSR. He has not had syncope. Minimal swelling.  No Known Allergies   Current Outpatient Prescriptions  Medication Sig Dispense Refill  . aspirin 81 MG chewable tablet Chew 81 mg by mouth daily.    [name] gabapentin (NEURONTIN) 300 MG capsule Take 1-2 capsules by mouth 3 (three) times daily.    [name] HYDROcodone-acetaminophen (NORCO) 10-325 MG tablet Take 2 tablets by mouth every 6 (six) hours as needed.     . metFORMIN (GLUCOPHAGE-XR) 500 MG 24 hr tablet Take 1 tablet by mouth 2 (two) times daily.    . pravastatin (PRAVACHOL) 40 MG tablet Take 40 mg by mouth daily.     . ranitidine (ZANTAC) 150 MG tablet Take 150 mg by mouth 2 (two) times daily.     . tamsulosin (FLOMAX) 0.4 MG CAPS capsule Take 0.4 mg by mouth daily after breakfast.     . carvedilol (COREG) 6.25 MG tablet Take 1 tablet (6.25 mg total) by mouth 2 (two) times daily. 60 tablet 6  . losartan (COZAAR) 25 MG tablet Take 1 tablet (25 mg total) by mouth daily. 30 tablet 6   No current facility-administered medications for this visit.      Past Medical History:  Diagnosis Date  . Back pain    Recent injection with aspirin and Plavix held, patient had chest pain that day  . Blood glucose elevated    March, 2012  . CAD (coronary artery disease)     Chest pain while off Plavix, March, 2012, catheterization, LAD stents patent, worsening third diagonal 80%-use PCI only for recurrent significant symptoms /  ..NSTEMI..[id number] DES LAD (kissing balloon)  . CAD (coronary artery disease)    plavix indefinitly  . CAD (coronary artery  disease)    cardiac clearance .[name].for back surgery...october 2011  . Dyslipidemia   . Gout    April, 201 to  . Hypertension    EF 50-55%, borderline anterior hypokinesis, catheterization March, 2012  . SOB (shortness of breath)     ROS:   All systems reviewed and negative except as noted in the HPI.   No past surgical history on file.   Family History  Problem Relation Age of Onset  . Coronary artery disease Father      Social History   Social History  . Marital status: Single    Spouse name: N/A  . Number of children: N/A  . Years of education: N/A   Occupational History  . Airline pilot   Social History Main Topics  . Smoking status: Former Smoker    Packs/day: 2.00    Years: 15.00    Types: Cigarettes    Quit date: 09/24/1973  . Smokeless tobacco: Current User    Types: Snuff     Comment: "I have been dipping snuff for 25 years or more"  . Alcohol use No  . Drug use: No  . Sexual activity: Not on file   Other Topics Concern  . Not on file   Social History Narrative  . No narrative on  file     BP 128/74   Pulse 66   Ht 6\' 1"  (1.854 m)   Wt 243 lb (110.2 kg)   BMI 32.06 kg/m   Physical Exam:  Well appearing 65 yo man, NAD HEENT: Unremarkable Neck:  6 cm JVD, no thyromegally Lymphatics:  No adenopathy Back:  No CVA tenderness Lungs:  Clear with no wheezes HEART:  Regular rate rhythm, no murmurs, no rubs, no clicks Abd:  soft, positive bowel sounds, no organomegally, no rebound, no guarding Ext:  2 plus pulses, no edema, no cyanosis, no clubbing Skin:  No rashes no nodules Neuro:  CN II through XII intact, motor grossly intact  EKG - nsr with PVC's.  Assess/Plan: 1. Ischemic CM - he denies anginal symptoms. He will have his medications adjusted today. No evidence or clinical suggestion of silent ischemia. 2. Chronic systolic heart failure - his symptoms are class 2. He will have his meds adjusted by starting coreg and  an ARB and stopping metoprolol. Might consider Entresto. 3. PVC's - he not symptomatic. I do not think his ectopy is causing his LV dysfunction.  4. HTN heart disease - His blood pressure is reasonably well controlled. Will follow.  68.D.

## 2016-03-01 NOTE — Patient Instructions (Addendum)
Medication Instructions:  Your physician has recommended you make the following change in your medication:  1) STOP Metoprolol 2) Start Coreg 6.25 mg twice a day 3) START Losartan 25 mg daily   Labwork: BMP in 2 weeks    Testing/Procedures: None Ordered   Follow-Up: Cancel appointment with Dr. Purvis Sheffield in January 2018  Your physician recommends that you schedule a follow-up appointment in: mid-late February with Dr. Ladona Ridgel.  Any Other Special Instructions Will Be Listed Below (If Applicable).     If you need a refill on your cardiac medications before your next appointment, please call your pharmacy.

## 2016-03-15 ENCOUNTER — Encounter: Payer: Self-pay | Admitting: Internal Medicine

## 2016-03-28 ENCOUNTER — Ambulatory Visit: Payer: Medicare Other | Admitting: Cardiovascular Disease

## 2016-05-10 ENCOUNTER — Ambulatory Visit: Payer: Medicare Other | Admitting: Cardiovascular Disease

## 2016-05-18 ENCOUNTER — Encounter: Payer: Self-pay | Admitting: Internal Medicine

## 2016-05-18 ENCOUNTER — Ambulatory Visit (INDEPENDENT_AMBULATORY_CARE_PROVIDER_SITE_OTHER): Payer: Medicare HMO | Admitting: Internal Medicine

## 2016-05-18 VITALS — BP 156/84 | HR 60 | Ht 73.0 in | Wt 235.0 lb

## 2016-05-18 DIAGNOSIS — I10 Essential (primary) hypertension: Secondary | ICD-10-CM | POA: Diagnosis not present

## 2016-05-18 MED ORDER — CARVEDILOL 12.5 MG PO TABS: 12.5000 mg | ORAL_TABLET | Freq: Two times a day (BID) | ORAL | 3 refills | Status: DC

## 2016-05-18 MED ORDER — LOSARTAN POTASSIUM 50 MG PO TABS: 50.0000 mg | ORAL_TABLET | Freq: Every day | ORAL | 3 refills | Status: DC

## 2016-05-18 NOTE — Progress Notes (Signed)
HPI Mr. Deroos returns today for ongoing evaluation of LV dysfunction. He is a pleasant 66 yo man with longstanding CAD who was admitted to [facility] with NSTEM, underwent CABG several months ago. When I saw him last, we uptitrated his medications.  Previously he had mostly normal LV function. He has class 2 dyspnea and was seen by Dr. Eden Emms and found to have an EF by echo of 20%. He had a holter which demonstrated PVC's and NSR. He has not had syncope. Minimal swelling. He notes that when he initially came home from the hospital his energy level was good but not as much now. His blood pressure is not well controlled.  No Known Allergies   Current Outpatient Prescriptions  Medication Sig Dispense Refill  . aspirin 81 MG chewable tablet Chew 81 mg by mouth daily.    . carvedilol (COREG) 6.25 MG tablet Take 1 tablet (6.25 mg total) by mouth 2 (two) times daily. 60 tablet 6  . gabapentin (NEURONTIN) 300 MG capsule Take 1-2 capsules by mouth 3 (three) times daily.    [name] HYDROcodone-acetaminophen (NORCO) 10-325 MG tablet Take 2 tablets by mouth every 6 (six) hours as needed.     [name] losartan (COZAAR) 25 MG tablet Take 1 tablet (25 mg total) by mouth daily. 30 tablet 6  . metFORMIN (GLUCOPHAGE-XR) 500 MG 24 hr tablet Take 1 tablet by mouth 2 (two) times daily.    . pravastatin (PRAVACHOL) 40 MG tablet Take 40 mg by mouth daily.     . ranitidine (ZANTAC) 150 MG tablet Take 150 mg by mouth 2 (two) times daily.     . tamsulosin (FLOMAX) 0.4 MG CAPS capsule Take 0.4 mg by mouth daily after breakfast.      No current facility-administered medications for this visit.      Past Medical History:  Diagnosis Date  . Back pain    Recent injection with aspirin and Plavix held, patient had chest pain that day  . Blood glucose elevated    March, 2012  . CAD (coronary artery disease)     Chest pain while off Plavix, March, 2012, catheterization, LAD stents patent, worsening third diagonal 80%-use  PCI only for recurrent significant symptoms /  ..NSTEMI..[id number] DES LAD (kissing balloon)  . CAD (coronary artery disease)    plavix indefinitly  . CAD (coronary artery disease)    cardiac clearance .[name].for back surgery...october 2011  . Dyslipidemia   . Gout    April, 201 to  . Hypertension    EF 50-55%, borderline anterior hypokinesis, catheterization March, 2012  . SOB (shortness of breath)     ROS:   All systems reviewed and negative except as noted in the HPI.   History reviewed. No pertinent surgical history.   Family History  Problem Relation Age of Onset  . Coronary artery disease Father      Social History   Social History  . Marital status: Single    Spouse name: N/A  . Number of children: N/A  . Years of education: N/A   Occupational History  . Airline pilot   Social History Main Topics  . Smoking status: Former Smoker    Packs/day: 2.00    Years: 15.00    Types: Cigarettes    Quit date: 09/24/1973  . Smokeless tobacco: Current User    Types: Snuff     Comment: "I have been dipping snuff for 25 years or more"  . Alcohol use  No  . Drug use: No  . Sexual activity: Not on file   Other Topics Concern  . Not on file   Social History Narrative  . No narrative on file     BP (!) 156/84   Pulse 60   Ht 6\' 1"  (1.854 m)   Wt 235 lb (106.6 kg)   SpO2 98%   BMI 31.00 kg/m   Physical Exam:  Well appearing 66 yo man, NAD HEENT: Unremarkable Neck:  6 cm JVD, no thyromegally Lymphatics:  No adenopathy Back:  No CVA tenderness Lungs:  Clear with no wheezes HEART:  Regular rate rhythm, no murmurs, no rubs, no clicks Abd:  soft, positive bowel sounds, no organomegally, no rebound, no guarding Ext:  2 plus pulses, no edema, no cyanosis, no clubbing Skin:  No rashes no nodules Neuro:  CN II through XII intact, motor grossly intact  EKG - nsr with PVC's.  Assess/Plan: 1. Ischemic CM - he denies anginal symptoms. He will have  his medications adjusted today. No evidence or clinical suggestion of silent ischemia. Might consider repeat heart cath if his symptoms and EF do not improve. 2. Chronic systolic heart failure - his symptoms are class 2. He will have his meds adjusted by increasing coreg and an ARB. I will have him repeat his echo after this round of medication uptitration. If still low, will consider ICD. 3. PVC's - he not symptomatic. I do not think his ectopy is causing his LV dysfunction.  4. HTN heart disease - His blood pressure is not well controlled. Will uptitrate his meds.  68.D.

## 2016-05-18 NOTE — Patient Instructions (Signed)
Your physician recommends that you schedule a follow-up appointment in: 7 Weeks with Dr. Ladona Ridgel   Your physician has recommended you make the following change in your medication:   Increase Coreg to 12.5 mg Two Times Daily  Increase Losartan to 50 mg Daily   If you need a refill on your cardiac medications before your next appointment, please call your pharmacy.  Thank you for choosing [facility]!

## 2016-07-06 ENCOUNTER — Ambulatory Visit (HOSPITAL_COMMUNITY)
Admission: RE | Admit: 2016-07-06 | Discharge: 2016-07-06 | Disposition: A | Discharge status: Home / Self Care | Payer: Medicare HMO | Source: Ambulatory Visit | Attending: Internal Medicine | Admitting: Internal Medicine

## 2016-07-06 DIAGNOSIS — I10 Essential (primary) hypertension: Secondary | ICD-10-CM | POA: Diagnosis not present

## 2016-07-06 DIAGNOSIS — Z87891 Personal history of nicotine dependence: Secondary | ICD-10-CM | POA: Diagnosis not present

## 2016-07-06 DIAGNOSIS — I2510 Atherosclerotic heart disease of native coronary artery without angina pectoris: Secondary | ICD-10-CM | POA: Insufficient documentation

## 2016-07-06 DIAGNOSIS — I340 Nonrheumatic mitral (valve) insufficiency: Secondary | ICD-10-CM | POA: Insufficient documentation

## 2016-07-06 DIAGNOSIS — E785 Hyperlipidemia, unspecified: Secondary | ICD-10-CM | POA: Diagnosis not present

## 2016-07-06 NOTE — Progress Notes (Signed)
*  PRELIMINARY RESULTS* Echocardiogram 2D Echocardiogram has been performed.  Jeryl Columbia 07/06/2016, 10:16 AM

## 2016-07-24 ENCOUNTER — Other Ambulatory Visit (HOSPITAL_COMMUNITY)
Admission: RE | Admit: 2016-07-24 | Discharge: 2016-07-24 | Disposition: A | Discharge status: Home / Self Care | Payer: Medicare HMO | Source: Ambulatory Visit | Attending: Internal Medicine | Admitting: Internal Medicine

## 2016-07-24 ENCOUNTER — Encounter: Payer: Self-pay | Admitting: *Deleted

## 2016-07-24 ENCOUNTER — Encounter: Payer: Self-pay | Admitting: Internal Medicine

## 2016-07-24 ENCOUNTER — Ambulatory Visit (INDEPENDENT_AMBULATORY_CARE_PROVIDER_SITE_OTHER): Payer: Medicare HMO | Admitting: Internal Medicine

## 2016-07-24 VITALS — BP 132/64 | HR 61 | Ht 73.0 in | Wt 241.0 lb

## 2016-07-24 DIAGNOSIS — I5022 Chronic systolic (congestive) heart failure: Secondary | ICD-10-CM | POA: Insufficient documentation

## 2016-07-24 DIAGNOSIS — I255 Ischemic cardiomyopathy: Secondary | ICD-10-CM | POA: Diagnosis not present

## 2016-07-24 LAB — BASIC METABOLIC PANEL
Anion gap: 7 (ref 5–15)
BUN: 18 mg/dL (ref 6–20)
CALCIUM: 9.3 mg/dL (ref 8.9–10.3)
CO2: 27 mmol/L (ref 22–32)
CREATININE: 1.38 mg/dL — AB (ref 0.61–1.24)
Chloride: 105 mmol/L (ref 101–111)
GFR calc Af Amer: >60 mL/min (ref >60)
GFR, EST NON AFRICAN AMERICAN: 53 mL/min — AB (ref >60)
Glucose, Bld: 145 mg/dL — ABNORMAL HIGH (ref 65–99)
Potassium: 4.7 mmol/L (ref 3.5–5.1)
SODIUM: 139 mmol/L (ref 135–145)

## 2016-07-24 LAB — CBC WITH DIFFERENTIAL/PLATELET
Basophils Absolute: 0 10*3/uL (ref 0.0–0.1)
Basophils Relative: 0 %
EOS ABS: 0.3 10*3/uL (ref 0.0–0.7)
EOS PCT: 5 %
HCT: 39.5 % (ref 39.0–52.0)
Hemoglobin: 13.1 g/dL (ref 13.0–17.0)
LYMPHS ABS: 1.7 10*3/uL (ref 0.7–4.0)
Lymphocytes Relative: 28 %
MCH: 28.7 pg (ref 26.0–34.0)
MCHC: 33.2 g/dL (ref 30.0–36.0)
MCV: 86.6 fL (ref 78.0–100.0)
MONOS PCT: 4 %
Monocytes Absolute: 0.3 10*3/uL (ref 0.1–1.0)
Neutro Abs: 3.7 10*3/uL (ref 1.7–7.7)
Neutrophils Relative %: 63 %
PLATELETS: 102 10*3/uL — AB (ref 150–400)
RBC: 4.56 MIL/uL (ref 4.22–5.81)
RDW: 14.3 % (ref 11.5–15.5)
WBC: 5.9 10*3/uL (ref 4.0–10.5)

## 2016-07-24 LAB — PROTIME-INR
INR: 1.06
PROTHROMBIN TIME: 13.8 s (ref 11.4–15.2)

## 2016-07-24 NOTE — Patient Instructions (Signed)
Your physician has recommended that you have a defibrillator inserted. An implantable cardioverter defibrillator (ICD) is a small device that is placed in your chest or, in rare cases, your abdomen. This device uses electrical pulses or shocks to help control life-threatening, irregular heartbeats that could lead the heart to suddenly stop beating (sudden cardiac arrest). Leads are attached to the ICD that goes into your heart. This is done in the hospital and usually requires an overnight stay. Please see the instruction sheet given to you today for more information.  Your physician recommends that you continue on your current medications as directed. Please refer to the Current Medication list given to you today.  Your physician recommends that you return for lab work in: Today   If you need a refill on your cardiac medications before your next appointment, please call your pharmacy.  Thank you for choosing [facility]!

## 2016-07-24 NOTE — Progress Notes (Signed)
HPI Anthony Adams returns today for ongoing evaluation of LV dysfunction. He is a pleasant 66 yo man with longstanding CAD who was admitted to [facility] with NSTEM, underwent CABG several months ago. When I saw him last, we uptitrated his medications.  Previously he had mostly normal LV function. He has class 2 dyspnea and was seen by Dr. Eden Emms and found to have an EF by echo of 20%. He had a holter which demonstrated PVC's and NSR. He has not had syncope. Minimal swelling. He notes that when he initially came home from the hospital his energy level was good but not as much now. His blood pressure is not well controlled. I uptitrated his meds and he returns today for additional evaluation. His repeat EF by echo is 30%. No Known Allergies   Current Outpatient Prescriptions  Medication Sig Dispense Refill  . aspirin 81 MG chewable tablet Chew 81 mg by mouth daily.    . carvedilol (COREG) 12.5 MG tablet Take 1 tablet (12.5 mg total) by mouth 2 (two) times daily. 180 tablet 3  . gabapentin (NEURONTIN) 300 MG capsule Take 1-2 capsules by mouth 3 (three) times daily.    [name] HYDROcodone-acetaminophen (NORCO) 10-325 MG tablet Take 2 tablets by mouth every 6 (six) hours as needed.     [name] losartan (COZAAR) 50 MG tablet Take 1 tablet (50 mg total) by mouth daily. 90 tablet 3  . metFORMIN (GLUCOPHAGE-XR) 500 MG 24 hr tablet Take 1 tablet by mouth 2 (two) times daily.    . ranitidine (ZANTAC) 150 MG tablet Take 150 mg by mouth 2 (two) times daily.     . tamsulosin (FLOMAX) 0.4 MG CAPS capsule Take 0.4 mg by mouth daily after breakfast.     . pravastatin (PRAVACHOL) 40 MG tablet Take 40 mg by mouth daily.      No current facility-administered medications for this visit.      Past Medical History:  Diagnosis Date  . Back pain    Recent injection with aspirin and Plavix held, patient had chest pain that day  . Blood glucose elevated    March, 2012  . CAD (coronary artery disease)     Chest pain  while off Plavix, March, 2012, catheterization, LAD stents patent, worsening third diagonal 80%-use PCI only for recurrent significant symptoms /  ..NSTEMI..5366YQI DES LAD (kissing balloon)  . CAD (coronary artery disease)    plavix indefinitly  . CAD (coronary artery disease)    cardiac clearance .[name].for back surgery...october 2011  . Dyslipidemia   . Gout    April, 201 to  . Hypertension    EF 50-55%, borderline anterior hypokinesis, catheterization March, 2012  . SOB (shortness of breath)     ROS:   All systems reviewed and negative except as noted in the HPI.   History reviewed. No pertinent surgical history.   Family History  Problem Relation Age of Onset  . Coronary artery disease Father      Social History   Social History  . Marital status: Single    Spouse name: N/A  . Number of children: N/A  . Years of education: N/A   Occupational History  . Airline pilot   Social History Main Topics  . Smoking status: Former Smoker    Packs/day: 2.00    Years: 15.00    Types: Cigarettes    Quit date: 09/24/1973  . Smokeless tobacco: Current User    Types: Snuff  Comment: "I have been dipping snuff for 25 years or more"  . Alcohol use No  . Drug use: No  . Sexual activity: Not on file   Other Topics Concern  . Not on file   Social History Narrative  . No narrative on file     BP 132/64   Pulse 61   Ht 6' 1" (1.854 m)   Wt 241 lb (109.3 kg)   SpO2 96%   BMI 31.80 kg/m   Physical Exam:  Well appearing 65 yo man, NAD HEENT: Unremarkable Neck:  6 cm JVD, no thyromegally Lymphatics:  No adenopathy Back:  No CVA tenderness Lungs:  Clear with no wheezes HEART:  Regular rate rhythm, no murmurs, no rubs, no clicks Abd:  soft, positive bowel sounds, no organomegally, no rebound, no guarding Ext:  2 plus pulses, no edema, no cyanosis, no clubbing Skin:  No rashes no nodules Neuro:  CN II through XII intact, motor grossly  intact  EKG - nsr with PVC's.  Assess/Plan: 1. Ischemic CM - he denies anginal symptoms. He is on maximal medical therapy. I have discussed the indications for ICD implant with the patient and he wishes to proceed. 2. Chronic systolic heart failure - his symptoms are class 2. He will have his meds adjusted by increasing coreg and an ARB. I will have him repeat his echo after this round of medication uptitration. If still low, will consider ICD. 3. PVC's - he is not symptomatic. I do not think his ectopy is causing his LV dysfunction.  4. HTN heart disease - His blood pressure is not well controlled. Will uptitrate his meds.  Anthony Adams,M.D. 

## 2016-08-02 ENCOUNTER — Encounter (HOSPITAL_COMMUNITY)
Admission: RE | Disposition: A | Discharge status: Home / Self Care | Payer: Self-pay | Source: Ambulatory Visit | Attending: Internal Medicine

## 2016-08-02 ENCOUNTER — Ambulatory Visit (HOSPITAL_COMMUNITY)
Admission: RE | Admit: 2016-08-02 | Discharge: 2016-08-03 | Disposition: A | Discharge status: Home / Self Care | Payer: Medicare HMO | Source: Ambulatory Visit | Attending: Internal Medicine | Admitting: Internal Medicine

## 2016-08-02 ENCOUNTER — Encounter (HOSPITAL_COMMUNITY): Payer: Self-pay | Admitting: General Practice

## 2016-08-02 DIAGNOSIS — I5022 Chronic systolic (congestive) heart failure: Secondary | ICD-10-CM | POA: Diagnosis not present

## 2016-08-02 DIAGNOSIS — Z951 Presence of aortocoronary bypass graft: Secondary | ICD-10-CM | POA: Diagnosis not present

## 2016-08-02 DIAGNOSIS — I2510 Atherosclerotic heart disease of native coronary artery without angina pectoris: Secondary | ICD-10-CM | POA: Diagnosis not present

## 2016-08-02 DIAGNOSIS — Z7983 Long term (current) use of bisphosphonates: Secondary | ICD-10-CM | POA: Insufficient documentation

## 2016-08-02 DIAGNOSIS — Z95810 Presence of automatic (implantable) cardiac defibrillator: Secondary | ICD-10-CM | POA: Diagnosis present

## 2016-08-02 DIAGNOSIS — I255 Ischemic cardiomyopathy: Secondary | ICD-10-CM

## 2016-08-02 DIAGNOSIS — E785 Hyperlipidemia, unspecified: Secondary | ICD-10-CM | POA: Insufficient documentation

## 2016-08-02 DIAGNOSIS — Z79899 Other long term (current) drug therapy: Secondary | ICD-10-CM | POA: Diagnosis not present

## 2016-08-02 DIAGNOSIS — Z7984 Long term (current) use of oral hypoglycemic drugs: Secondary | ICD-10-CM | POA: Insufficient documentation

## 2016-08-02 DIAGNOSIS — Z7982 Long term (current) use of aspirin: Secondary | ICD-10-CM | POA: Diagnosis not present

## 2016-08-02 DIAGNOSIS — I110 Hypertensive heart disease with heart failure: Secondary | ICD-10-CM | POA: Insufficient documentation

## 2016-08-02 DIAGNOSIS — I252 Old myocardial infarction: Secondary | ICD-10-CM | POA: Diagnosis not present

## 2016-08-02 DIAGNOSIS — Z006 Encounter for examination for normal comparison and control in clinical research program: Secondary | ICD-10-CM | POA: Diagnosis not present

## 2016-08-02 DIAGNOSIS — Z87891 Personal history of nicotine dependence: Secondary | ICD-10-CM | POA: Diagnosis not present

## 2016-08-02 HISTORY — DX: Presence of automatic (implantable) cardiac defibrillator: Z95.810

## 2016-08-02 HISTORY — DX: Unspecified osteoarthritis, unspecified site: M19.90

## 2016-08-02 HISTORY — PX: ICD IMPLANT: EP1208

## 2016-08-02 HISTORY — DX: Chronic systolic (congestive) heart failure: I50.22

## 2016-08-02 LAB — SURGICAL PCR SCREEN
MRSA, PCR: NEGATIVE
Staphylococcus aureus: NEGATIVE

## 2016-08-02 LAB — GLUCOSE, CAPILLARY: Glucose-Capillary: 128 mg/dL — ABNORMAL HIGH (ref 65–99)

## 2016-08-02 SURGERY — ICD IMPLANT

## 2016-08-02 MED ORDER — HEPARIN (PORCINE) IN NACL 2-0.9 UNIT/ML-% IJ SOLN
INTRAMUSCULAR | Status: AC
  Filled 2016-08-02: qty 500

## 2016-08-02 MED ORDER — CEFAZOLIN SODIUM-DEXTROSE 2-4 GM/100ML-% IV SOLN
2.0000 g | INTRAVENOUS | Status: AC
  Administered 2016-08-02: 2 g via INTRAVENOUS

## 2016-08-02 MED ORDER — SODIUM CHLORIDE 0.9 % IV SOLN
INTRAVENOUS | Status: DC
  Administered 2016-08-02: 07:00:00 via INTRAVENOUS

## 2016-08-02 MED ORDER — CARVEDILOL 12.5 MG PO TABS
12.5000 mg | ORAL_TABLET | Freq: Two times a day (BID) | ORAL | Status: DC
  Administered 2016-08-02 – 2016-08-03 (×2): 12.5 mg via ORAL
  Filled 2016-08-02 (×2): qty 1

## 2016-08-02 MED ORDER — HEPARIN (PORCINE) IN NACL 2-0.9 UNIT/ML-% IJ SOLN
INTRAMUSCULAR | Status: DC | PRN
  Administered 2016-08-02: 1000 mL

## 2016-08-02 MED ORDER — LOSARTAN POTASSIUM 50 MG PO TABS
50.0000 mg | ORAL_TABLET | Freq: Every day | ORAL | Status: DC
  Administered 2016-08-03: 50 mg via ORAL
  Filled 2016-08-02: qty 1

## 2016-08-02 MED ORDER — MIDAZOLAM HCL 5 MG/5ML IJ SOLN
INTRAMUSCULAR | Status: DC | PRN
  Administered 2016-08-02 (×6): 1 mg via INTRAVENOUS

## 2016-08-02 MED ORDER — SODIUM CHLORIDE 0.9 % IV SOLN
INTRAVENOUS | Status: DC
  Administered 2016-08-02: 08:00:00 via INTRAVENOUS

## 2016-08-02 MED ORDER — HYDROCODONE-ACETAMINOPHEN 10-325 MG PO TABS
1.0000 | ORAL_TABLET | Freq: Four times a day (QID) | ORAL | Status: DC | PRN
  Administered 2016-08-02 – 2016-08-03 (×4): 1 via ORAL
  Filled 2016-08-02 (×5): qty 1

## 2016-08-02 MED ORDER — LIDOCAINE HCL (PF) 1 % IJ SOLN
INTRAMUSCULAR | Status: DC | PRN
  Administered 2016-08-02: 40 mL

## 2016-08-02 MED ORDER — METFORMIN HCL ER 500 MG PO TB24
500.0000 mg | ORAL_TABLET | Freq: Two times a day (BID) | ORAL | Status: DC
  Administered 2016-08-02 – 2016-08-03 (×2): 500 mg via ORAL
  Filled 2016-08-02 (×2): qty 1

## 2016-08-02 MED ORDER — ACETAMINOPHEN 500 MG PO TABS: 1000.0000 mg | ORAL_TABLET | Freq: Two times a day (BID) | ORAL | Status: DC | PRN

## 2016-08-02 MED ORDER — CEFAZOLIN SODIUM-DEXTROSE 2-4 GM/100ML-% IV SOLN
INTRAVENOUS | Status: AC
  Filled 2016-08-02: qty 100

## 2016-08-02 MED ORDER — MIDAZOLAM HCL 5 MG/5ML IJ SOLN
INTRAMUSCULAR | Status: AC
  Filled 2016-08-02: qty 5

## 2016-08-02 MED ORDER — FENTANYL CITRATE (PF) 100 MCG/2ML IJ SOLN
INTRAMUSCULAR | Status: DC | PRN
  Administered 2016-08-02 (×2): 12.5 ug via INTRAVENOUS
  Administered 2016-08-02: 25 ug via INTRAVENOUS
  Administered 2016-08-02 (×2): 12.5 ug via INTRAVENOUS

## 2016-08-02 MED ORDER — SODIUM CHLORIDE 0.9 % IR SOLN
80.0000 mg | Status: AC
  Administered 2016-08-02: 80 mg

## 2016-08-02 MED ORDER — PRAVASTATIN SODIUM 40 MG PO TABS
40.0000 mg | ORAL_TABLET | Freq: Every day | ORAL | Status: DC
  Administered 2016-08-03: 40 mg via ORAL
  Filled 2016-08-02: qty 1

## 2016-08-02 MED ORDER — ONDANSETRON HCL 4 MG/2ML IJ SOLN: 4.0000 mg | Freq: Four times a day (QID) | INTRAMUSCULAR | Status: DC | PRN

## 2016-08-02 MED ORDER — MUPIROCIN 2 % EX OINT
1.0000 "application " | TOPICAL_OINTMENT | Freq: Once | CUTANEOUS | Status: AC
  Administered 2016-08-02: 1 via TOPICAL

## 2016-08-02 MED ORDER — LIDOCAINE HCL (PF) 1 % IJ SOLN
INTRAMUSCULAR | Status: AC
  Filled 2016-08-02: qty 60

## 2016-08-02 MED ORDER — GABAPENTIN 100 MG PO CAPS
100.0000 mg | ORAL_CAPSULE | Freq: Three times a day (TID) | ORAL | Status: DC
  Administered 2016-08-02 – 2016-08-03 (×3): 100 mg via ORAL
  Filled 2016-08-02 (×3): qty 1

## 2016-08-02 MED ORDER — CEFAZOLIN SODIUM-DEXTROSE 1-4 GM/50ML-% IV SOLN
1.0000 g | Freq: Four times a day (QID) | INTRAVENOUS | Status: AC
  Administered 2016-08-02 – 2016-08-03 (×3): 1 g via INTRAVENOUS
  Filled 2016-08-02 (×3): qty 50

## 2016-08-02 MED ORDER — ASPIRIN 81 MG PO CHEW
81.0000 mg | CHEWABLE_TABLET | Freq: Every day | ORAL | Status: DC
  Administered 2016-08-03: 81 mg via ORAL
  Filled 2016-08-02: qty 1

## 2016-08-02 MED ORDER — CHLORHEXIDINE GLUCONATE 4 % EX LIQD
60.0000 mL | Freq: Once | CUTANEOUS | Status: DC
  Filled 2016-08-02: qty 60

## 2016-08-02 MED ORDER — ACETAMINOPHEN 325 MG PO TABS: 325.0000 mg | ORAL_TABLET | ORAL | Status: DC | PRN

## 2016-08-02 MED ORDER — FENTANYL CITRATE (PF) 100 MCG/2ML IJ SOLN
INTRAMUSCULAR | Status: AC
  Filled 2016-08-02: qty 2

## 2016-08-02 MED ORDER — MUPIROCIN 2 % EX OINT
TOPICAL_OINTMENT | CUTANEOUS | Status: AC
  Administered 2016-08-02: 1 via TOPICAL
  Filled 2016-08-02: qty 22

## 2016-08-02 MED ORDER — TAMSULOSIN HCL 0.4 MG PO CAPS
0.4000 mg | ORAL_CAPSULE | Freq: Every day | ORAL | Status: DC
  Administered 2016-08-03: 0.4 mg via ORAL
  Filled 2016-08-02: qty 1

## 2016-08-02 MED ORDER — SODIUM CHLORIDE 0.9 % IR SOLN
Status: AC
  Filled 2016-08-02: qty 2

## 2016-08-02 SURGICAL SUPPLY — 6 items
CABLE SURGICAL S-101-97-12 (CABLE) ×3 IMPLANT
ICD INTICA DX MRI 404633 (ICD Generator) ×3 IMPLANT
LEAD PLEXA S DX 65/15 414005 (Lead) ×3 IMPLANT
PAD DEFIB LIFELINK (PAD) ×3 IMPLANT
SHEATH CLASSIC 9F (SHEATH) ×3 IMPLANT
TRAY PACEMAKER INSERTION (PACKS) ×3 IMPLANT

## 2016-08-02 NOTE — Interval H&P Note (Signed)
History and Physical Interval Note:  08/02/2016 7:22 AM  Anthony Adams  has presented today for surgery, with the diagnosis of chronic systolic heart failure, 30% EF  The various methods of treatment have been discussed with the patient and family. After consideration of risks, benefits and other options for treatment, the patient has consented to  Procedure(s): ICD Implant (N/A) as a surgical intervention .  The patient's history has been reviewed, patient examined, no change in status, stable for surgery.  I have reviewed the patient's chart and labs.  Questions were answered to the patient's satisfaction.     Lewayne Bunting

## 2016-08-02 NOTE — H&P (View-Only) (Signed)
HPI Anthony Adams returns today for ongoing evaluation of LV dysfunction. He is a pleasant 66 yo man with longstanding CAD who was admitted to [facility] with NSTEM, underwent CABG several months ago. When I saw him last, we uptitrated his medications.  Previously he had mostly normal LV function. He has class 2 dyspnea and was seen by Dr. Eden Emms and found to have an EF by echo of 20%. He had a holter which demonstrated PVC's and NSR. He has not had syncope. Minimal swelling. He notes that when he initially came home from the hospital his energy level was good but not as much now. His blood pressure is not well controlled. I uptitrated his meds and he returns today for additional evaluation. His repeat EF by echo is 30%. No Known Allergies   Current Outpatient Prescriptions  Medication Sig Dispense Refill  . aspirin 81 MG chewable tablet Chew 81 mg by mouth daily.    . carvedilol (COREG) 12.5 MG tablet Take 1 tablet (12.5 mg total) by mouth 2 (two) times daily. 180 tablet 3  . gabapentin (NEURONTIN) 300 MG capsule Take 1-2 capsules by mouth 3 (three) times daily.    [name] HYDROcodone-acetaminophen (NORCO) 10-325 MG tablet Take 2 tablets by mouth every 6 (six) hours as needed.     [name] losartan (COZAAR) 50 MG tablet Take 1 tablet (50 mg total) by mouth daily. 90 tablet 3  . metFORMIN (GLUCOPHAGE-XR) 500 MG 24 hr tablet Take 1 tablet by mouth 2 (two) times daily.    . ranitidine (ZANTAC) 150 MG tablet Take 150 mg by mouth 2 (two) times daily.     . tamsulosin (FLOMAX) 0.4 MG CAPS capsule Take 0.4 mg by mouth daily after breakfast.     . pravastatin (PRAVACHOL) 40 MG tablet Take 40 mg by mouth daily.      No current facility-administered medications for this visit.      Past Medical History:  Diagnosis Date  . Back pain    Recent injection with aspirin and Plavix held, patient had chest pain that day  . Blood glucose elevated    March, 2012  . CAD (coronary artery disease)     Chest pain  while off Plavix, March, 2012, catheterization, LAD stents patent, worsening third diagonal 80%-use PCI only for recurrent significant symptoms /  ..NSTEMI..5366YQI DES LAD (kissing balloon)  . CAD (coronary artery disease)    plavix indefinitly  . CAD (coronary artery disease)    cardiac clearance .[name].for back surgery...october 2011  . Dyslipidemia   . Gout    April, 201 to  . Hypertension    EF 50-55%, borderline anterior hypokinesis, catheterization March, 2012  . SOB (shortness of breath)     ROS:   All systems reviewed and negative except as noted in the HPI.   History reviewed. No pertinent surgical history.   Family History  Problem Relation Age of Onset  . Coronary artery disease Father      Social History   Social History  . Marital status: Single    Spouse name: N/A  . Number of children: N/A  . Years of education: N/A   Occupational History  . Airline pilot   Social History Main Topics  . Smoking status: Former Smoker    Packs/day: 2.00    Years: 15.00    Types: Cigarettes    Quit date: 09/24/1973  . Smokeless tobacco: Current User    Types: Snuff  Comment: "I have been dipping snuff for 25 years or more"  . Alcohol use No  . Drug use: No  . Sexual activity: Not on file   Other Topics Concern  . Not on file   Social History Narrative  . No narrative on file     BP 132/64   Pulse 61   Ht 6\' 1"  (1.854 m)   Wt 241 lb (109.3 kg)   SpO2 96%   BMI 31.80 kg/m   Physical Exam:  Well appearing 66 yo man, NAD HEENT: Unremarkable Neck:  6 cm JVD, no thyromegally Lymphatics:  No adenopathy Back:  No CVA tenderness Lungs:  Clear with no wheezes HEART:  Regular rate rhythm, no murmurs, no rubs, no clicks Abd:  soft, positive bowel sounds, no organomegally, no rebound, no guarding Ext:  2 plus pulses, no edema, no cyanosis, no clubbing Skin:  No rashes no nodules Neuro:  CN II through XII intact, motor grossly  intact  EKG - nsr with PVC's.  Assess/Plan: 1. Ischemic CM - he denies anginal symptoms. He is on maximal medical therapy. I have discussed the indications for ICD implant with the patient and he wishes to proceed. 2. Chronic systolic heart failure - his symptoms are class 2. He will have his meds adjusted by increasing coreg and an ARB. I will have him repeat his echo after this round of medication uptitration. If still low, will consider ICD. 3. PVC's - he is not symptomatic. I do not think his ectopy is causing his LV dysfunction.  4. HTN heart disease - His blood pressure is not well controlled. Will uptitrate his meds.  66.D.

## 2016-08-02 NOTE — Discharge Summary (Signed)
ELECTROPHYSIOLOGY PROCEDURE DISCHARGE SUMMARY    Patient ID: Anthony Adams,  MRN: [id number], DOB/AGE: 1951/02/26 66 y.o.  Admit date: 08/02/2016 Discharge date: 08/03/16  Primary Care Physician: Ignatius Specking, MD  Primary Cardiologist: Dr. Eden Emms Electrophysiologist: Dr. Ladona Ridgel  Primary Discharge Diagnosis:  1. ICM  Secondary Discharge Diagnosis:  1. CAD 2. HTN 3. PVCs 4. HLD 5. Chronic CHF (systolic)  No Known Allergies   Procedures This Admission:  1.  Implantation of a Biotronik single chamber ICD on 08/02/16 by Dr Ladona Ridgel.  The patient received a Biotronik (serial Number [id number]) ICD, with a Biotronik right ventricular defibrillator lead (#[id number] with atrial sensing DFT's were successful at 20J.  There were no immediate post procedure complications. 2.  CXR on 08/03/16 demonstrated no pneumothorax status post device implantation.   Brief HPI: Anthony Adams is a 66 y.o. male was referred to electrophysiology in the outpatient setting for consideration of ICD implantation.  Past medical history includes ICM, CAD (CABG), HTN, HLD, PVCs, chronic CHF.  The patient has persistent LV dysfunction despite guideline directed therapy.  Risks, benefits, and alternatives to ICD implantation were reviewed with the patient who wished to proceed.   Hospital Course:  The patient was admitted and underwent implantation of an ICD with details as outlined above. He  was monitored on telemetry overnight which demonstrated SR, occ PVCs.  Left chest was without hematoma or ecchymosis.  The device was interrogated and found to be functioning normally.  CXR was obtained and demonstrated no pneumothorax status post device implantation.  Wound care, arm mobility, and restrictions were reviewed with the patient.  The patient was examined by Dr. Ladona Ridgel and considered stable for discharge to home.   The patient's discharge medications include an ARB (losartan) and beta blocker (carvedilol).   Physical  Exam: Vitals:   08/02/16 0928 08/02/16 0933 08/02/16 1941 08/03/16 0641  BP: 128/77  (!) 145/68 (!) 155/74  Pulse: (!) 0 (!) 0 (!) 55 (!) 54  Resp: 10 (!) 0 18 18  Temp:   98.5 F (36.9 C) 98.3 F (36.8 C)  TempSrc:   Oral Oral  SpO2: 98% (!) 0% 97% 95%  Weight:      Height:        GEN- The patient is well appearing, alert and oriented x 3 today.   HEENT: normocephalic, atraumatic; sclera clear, conjunctiva pink; hearing intact; oropharynx clear Lungs- CTA b/l, normal work of breathing.  No wheezes, rales, rhonchi Heart- RRR, no murmurs, rubs or gallops, PMI not laterally displaced GI- soft, non-tender, non-distended Extremities- no clubbing, cyanosis, or edema MS- no significant deformity or atrophy Skin- warm and dry, no rash or lesion, left chest without hematoma/ecchymosis Psych- euthymic mood, full affect Neuro- no gross defecits  Labs:   Lab Results  Component Value Date   WBC 5.9 07/24/2016   HGB 13.1 07/24/2016   HCT 39.5 07/24/2016   MCV 86.6 07/24/2016   PLT 102 (L) 07/24/2016   No results for input(s): NA, K, CL, CO2, BUN, CREATININE, CALCIUM, PROT, BILITOT, ALKPHOS, ALT, AST, GLUCOSE in the last 168 hours.  Invalid input(s): LABALBU  Discharge Medications:  Allergies as of 08/03/2016   No Known Allergies     Medication List    TAKE these medications   acetaminophen 500 MG tablet Commonly known as:  TYLENOL Take 1,000 mg by mouth 2 (two) times daily as needed for headache.   aspirin 81 MG chewable tablet Chew 81 mg by mouth  daily.   carvedilol 12.5 MG tablet Commonly known as:  COREG Take 1 tablet (12.5 mg total) by mouth 2 (two) times daily.   gabapentin 300 MG capsule Commonly known as:  NEURONTIN Take 600 capsules by mouth 3 (three) times daily.   HYDROcodone-acetaminophen 10-325 MG tablet Commonly known as:  NORCO Take 1 tablet by mouth every 6 (six) hours as needed for moderate pain.   losartan 50 MG tablet Commonly known as:   COZAAR Take 1 tablet (50 mg total) by mouth daily.   metFORMIN 500 MG 24 hr tablet Commonly known as:  GLUCOPHAGE-XR Take 500 mg by mouth 2 (two) times daily.   pravastatin 40 MG tablet Commonly known as:  PRAVACHOL Take 40 mg by mouth daily.   ranitidine 150 MG tablet Commonly known as:  ZANTAC Take 150 mg by mouth 2 (two) times daily.   tamsulosin 0.4 MG Caps capsule Commonly known as:  FLOMAX Take 0.4 mg by mouth daily after breakfast.       Disposition:  home  Follow-up Information    [facility] Follow up on 08/14/2016.   Specialty:  Cardiology Why:  3:00PM, wound check Contact information: [address], [address] [place] [address] [phone]       Marinus Maw, MD Follow up on 10/26/2016.   Specialty:  Cardiology Why:  11:45AM Contact information: [address] [place] [address] [phone]           Duration of Discharge Encounter: Greater than 30 minutes including physician time.  Norma Fredrickson, PA-C 08/03/2016 8:01 AM   EP attending  Patient seen and examined. Agree with the findings as documented above. He is status post insertion of a single chamber Medtronic ICD and his chest x-ray and device interrogation looked good morning. He will be discharged home with usual follow-up.  Lewayne Bunting, M.D.

## 2016-08-02 NOTE — Discharge Instructions (Signed)
° ° °  Supplemental Discharge Instructions for  Pacemaker/Defibrillator Patients  Activity No heavy lifting or vigorous activity with your left/right arm for 6 to 8 weeks.  Do not raise your left/right arm above your head for one week.  Gradually raise your affected arm as drawn below.             08/06/16                     08/07/16                    08/08/16                   08/09/16 __  NO DRIVING for  1 week   ; you may begin driving on   0/26/37  .  WOUND CARE - Keep the wound area clean and dry.  Do not get this area wet for one week. No showers for one week; you may shower on  08/09/16  . - The tape/steri-strips on your wound will fall off; do not pull them off.  No bandage is needed on the site.  DO  NOT apply any creams, oils, or ointments to the wound area. - If you notice any drainage or discharge from the wound, any swelling or bruising at the site, or you develop a fever > 101? F after you are discharged home, call the office at once.  Special Instructions - You are still able to use cellular telephones; use the ear opposite the side where you have your pacemaker/defibrillator.  Avoid carrying your cellular phone near your device. - When traveling through airports, show security personnel your identification card to avoid being screened in the metal detectors.  Ask the security personnel to use the hand wand. - Avoid arc welding equipment, MRI testing (magnetic resonance imaging), TENS units (transcutaneous nerve stimulators).  Call the office for questions about other devices. - Avoid electrical appliances that are in poor condition or are not properly grounded. - Microwave ovens are safe to be near or to operate.  Additional information for defibrillator patients should your device go off: - If your device goes off ONCE and you feel fine afterward, notify the device clinic nurses. - If your device goes off ONCE and you do not feel well afterward, call 911. - If your device goes  off TWICE, call 911. - If your device goes off THREE times in one day, call 911.  DO NOT DRIVE YOURSELF OR A FAMILY MEMBER WITH A DEFIBRILLATOR TO THE HOSPITAL--CALL 911.

## 2016-08-03 ENCOUNTER — Ambulatory Visit (HOSPITAL_COMMUNITY): Payer: Medicare HMO

## 2016-08-03 DIAGNOSIS — I252 Old myocardial infarction: Secondary | ICD-10-CM | POA: Diagnosis not present

## 2016-08-03 DIAGNOSIS — Z006 Encounter for examination for normal comparison and control in clinical research program: Secondary | ICD-10-CM | POA: Diagnosis not present

## 2016-08-03 DIAGNOSIS — I255 Ischemic cardiomyopathy: Secondary | ICD-10-CM | POA: Diagnosis not present

## 2016-08-03 DIAGNOSIS — I110 Hypertensive heart disease with heart failure: Secondary | ICD-10-CM | POA: Diagnosis not present

## 2016-08-03 LAB — GLUCOSE, CAPILLARY: Glucose-Capillary: 108 mg/dL — ABNORMAL HIGH (ref 65–99)

## 2016-08-14 ENCOUNTER — Ambulatory Visit (INDEPENDENT_AMBULATORY_CARE_PROVIDER_SITE_OTHER): Payer: Medicare HMO | Admitting: *Deleted

## 2016-08-14 DIAGNOSIS — I5022 Chronic systolic (congestive) heart failure: Secondary | ICD-10-CM

## 2016-08-14 LAB — CUP PACEART INCLINIC DEVICE CHECK
Date Time Interrogation Session: 20180521154157
HighPow Impedance: 75 Ohm
Implantable Lead Implant Date: 20180509
Implantable Lead Model: 414005
Lead Channel Impedance Value: 754 Ohm
Lead Channel Pacing Threshold Pulse Width: 0.4 ms
Lead Channel Sensing Intrinsic Amplitude: 23.7 mV
Lead Channel Setting Pacing Amplitude: 3 V
Lead Channel Setting Pacing Pulse Width: 0.4 ms
MDC IDC LEAD LOCATION: 753860
MDC IDC LEAD SERIAL: 49845430
MDC IDC MSMT BATTERY VOLTAGE: 3.12 V
MDC IDC MSMT LEADCHNL RA SENSING INTR AMPL: 9.7 mV
MDC IDC MSMT LEADCHNL RV PACING THRESHOLD AMPLITUDE: 0.5 V
MDC IDC PG IMPLANT DT: 20180509
MDC IDC SET LEADCHNL RV SENSING SENSITIVITY: 0.8 mV
MDC IDC STAT BRADY RV PERCENT PACED: 0 %
Pulse Gen Serial Number: 60975637

## 2016-08-14 NOTE — Progress Notes (Signed)
Wound check appointment. Steri-strips removed. Wound without redness or edema. Incision edges approximated, wound well healed. Normal device function. Threshold, sensing, and impedances consistent with implant measurements. Device programmed at 3.0V for extra safety margin until 3 month visit. Histogram distribution appropriate for patient and level of activity. No atrial or ventricular arrhythmias noted. Patient educated about wound care, arm mobility, lifting restrictions, shock plan. ROV in 3 months with GT.

## 2016-09-21 ENCOUNTER — Ambulatory Visit (INDEPENDENT_AMBULATORY_CARE_PROVIDER_SITE_OTHER): Payer: Self-pay | Admitting: *Deleted

## 2016-09-21 ENCOUNTER — Encounter (INDEPENDENT_AMBULATORY_CARE_PROVIDER_SITE_OTHER): Payer: Self-pay

## 2016-09-21 DIAGNOSIS — T827XXA Infection and inflammatory reaction due to other cardiac and vascular devices, implants and grafts, initial encounter: Secondary | ICD-10-CM

## 2016-09-21 DIAGNOSIS — Z95810 Presence of automatic (implantable) cardiac defibrillator: Secondary | ICD-10-CM

## 2016-09-21 DIAGNOSIS — I255 Ischemic cardiomyopathy: Secondary | ICD-10-CM

## 2016-09-21 MED ORDER — CEPHALEXIN 500 MG PO CAPS
500.0000 mg | ORAL_CAPSULE | Freq: Two times a day (BID) | ORAL | 0 refills | Status: AC
Start: 2016-09-21 — End: 2016-10-01

## 2016-09-21 NOTE — Progress Notes (Signed)
[name] seen in Device Clinic today due to ICD pocket soreness into left shoulder/axilla and "puss pocket" on left chest lateral incision. Patient reports that he first noticed the area was sore yesterday morning (Wednesday) and then his sister came over in the afternoon and noticed an area that she thought may have been an ingrown/infected hair on incision. Upon palpation, [name] reports tenderness on top of left shoulder, in left axilla, below ICD pocket and over sternum. He reports that he cannot move his left arm without pain.  Anthony Adams evaluated incision and opened the lateral edge of incision that obviously contained underlying infection. Copious amounts of yellow/green drainage was evacuated from the pocket. [name] reports relief of pain after drainage. Per Dr. Lubertha Basque verbal orders, gauze and tegaderm applied over open wound approximately 71mm diameter, patient to keep this in place until Sunday evening and keep dry. Keflex 500mg  BID x 10 days sent to [facility]. Follow up arranged with [facility] 10/02/16 at 1:30pm. [name] was advised to keep this appointment for follow up as Anthony Adams as he does not have fever/chills, copious drainage, or severe pain. In the event of the aforementioned symptoms, he is to call Anthony Adams or proceed to [facility]. If he doesn't notice relief, has significant drainage or continues to have pain without high fever or chills he can call Monday and be seen by Dr. Saturday 09/25/16 if needed prior to scheduled appt 10/02/16. [name] and his sister verbalize understanding.

## 2016-10-02 ENCOUNTER — Ambulatory Visit (INDEPENDENT_AMBULATORY_CARE_PROVIDER_SITE_OTHER): Payer: Medicare HMO | Admitting: Internal Medicine

## 2016-10-02 ENCOUNTER — Encounter: Payer: Self-pay | Admitting: Internal Medicine

## 2016-10-02 ENCOUNTER — Encounter (INDEPENDENT_AMBULATORY_CARE_PROVIDER_SITE_OTHER): Payer: Self-pay

## 2016-10-02 VITALS — BP 142/60 | HR 60 | Ht 73.0 in | Wt 240.2 lb

## 2016-10-02 DIAGNOSIS — T827XXA Infection and inflammatory reaction due to other cardiac and vascular devices, implants and grafts, initial encounter: Secondary | ICD-10-CM | POA: Diagnosis not present

## 2016-10-02 DIAGNOSIS — Z95810 Presence of automatic (implantable) cardiac defibrillator: Secondary | ICD-10-CM

## 2016-10-02 DIAGNOSIS — I255 Ischemic cardiomyopathy: Secondary | ICD-10-CM

## 2016-10-02 NOTE — Progress Notes (Signed)
HPI The patient is a 66 yo man with a h/o DM, HTN, chronic systolic heart failure and an ICM, who underwent ICD insertion about 2 months ago. He has not had good control of his blood sugar and presented to the office a week ago with drainage over his ICD incision. A large amount of purulent material was removed. He had a small hole over his incision. Unclear if this represented a pocket infection or a deep stitch abscess. He has been treated with anti-biotics. He feels better. No symptoms of a systemic illness. No chest pain or sob. His blood sugar has been better.  No Known Allergies   Current Outpatient Prescriptions  Medication Sig Dispense Refill  . acetaminophen (TYLENOL) 500 MG tablet Take 1,000 mg by mouth 2 (two) times daily as needed for headache.    [name] aspirin 81 MG chewable tablet Chew 81 mg by mouth daily.    . carvedilol (COREG) 12.5 MG tablet Take 1 tablet (12.5 mg total) by mouth 2 (two) times daily. 180 tablet 3  . gabapentin (NEURONTIN) 300 MG capsule Take 600 capsules by mouth 3 (three) times daily.     [name] HYDROcodone-acetaminophen (NORCO) 10-325 MG tablet Take 1 tablet by mouth every 6 (six) hours as needed for moderate pain.     [name] losartan (COZAAR) 50 MG tablet Take 1 tablet (50 mg total) by mouth daily. 90 tablet 3  . metFORMIN (GLUCOPHAGE-XR) 500 MG 24 hr tablet Take 500 mg by mouth 2 (two) times daily.     . pravastatin (PRAVACHOL) 40 MG tablet Take 40 mg by mouth daily.     . ranitidine (ZANTAC) 150 MG tablet Take 150 mg by mouth 2 (two) times daily.     . tamsulosin (FLOMAX) 0.4 MG CAPS capsule Take 0.4 mg by mouth daily after breakfast.      No current facility-administered medications for this visit.      Past Medical History:  Diagnosis Date  . AICD (automatic cardioverter/defibrillator) present 08/02/2016  . Arthritis    " In my hands & feet "  . Back pain    Recent injection with aspirin and Plavix held, patient had chest pain that day  . Blood  glucose elevated    March, 2012  . CAD (coronary artery disease)     Chest pain while off Plavix, March, 2012, catheterization, LAD stents patent, worsening third diagonal 80%-use PCI only for recurrent significant symptoms /  ..NSTEMI..1287OMV DES LAD (kissing balloon)  . CAD (coronary artery disease)    plavix indefinitly  . CAD (coronary artery disease)    cardiac clearance .[name].for back surgery...october 2011  . Chronic systolic heart failure (HCC)   . Diabetes mellitus without complication (HCC)   . Dyslipidemia   . Gout    April, 201 to  . Hypertension    EF 50-55%, borderline anterior hypokinesis, catheterization March, 2012  . SOB (shortness of breath)     ROS:   All systems reviewed and negative except as noted in the HPI.   Past Surgical History:  Procedure Laterality Date  . BACK SURGERY    . ICD IMPLANT N/A 08/02/2016   Procedure: ICD Implant;  Surgeon: Marinus Maw, MD;  Location: [facility] INVASIVE CV LAB;  Service: Cardiovascular;  Laterality: N/A;  . INSERTION OF ICD  08/02/2016     Family History  Problem Relation Age of Onset  . Coronary artery disease Father      Social History  Social History  . Marital status: Single    Spouse name: N/A  . Number of children: N/A  . Years of education: N/A   Occupational History  . Airline pilot   Social History Main Topics  . Smoking status: Former Smoker    Packs/day: 2.00    Years: 15.00    Types: Cigarettes    Quit date: 09/24/1973  . Smokeless tobacco: Current User    Types: Snuff     Comment: "I have been dipping snuff for 25 years or more"  . Alcohol use No  . Drug use: No  . Sexual activity: Not on file   Other Topics Concern  . Not on file   Social History Narrative  . No narrative on file     BP (!) 142/60   Pulse 60   Ht 6\' 1"  (1.854 m)   Wt 240 lb 3.2 oz (109 kg)   BMI 31.69 kg/m   Physical Exam:  Well appearing NAD HEENT: Unremarkable Neck:  No JVD, no  thyromegally Lymphatics:  No adenopathy Back:  No CVA tenderness Lungs:  Clear HEART:  Regular rate rhythm, no murmurs, no rubs, no clicks Abd:  soft, positive bowel sounds, no organomegally, no rebound, no guarding Ext:  2 plus pulses, no edema, no cyanosis, no clubbing Skin:  No rashes no nodules Neuro:  CN II through XII intact, motor grossly intact   DEVICE  Normal device function.  See PaceArt for details.   Assess/Plan: 1. Pocket infection - he is much improved and his incision has healed up. He has been instructed about the warning signs as they pertain to his infection. He has finished a course of anti-biotics and will undergo watchful. He has been given the warning signs to suggest he has a problem. He will call if he experiences drainage from the device.  2. Chronic systolic heart failure - his symptoms are well compensated. He is encouraged to use a low sodium diet.  [place].D.

## 2016-10-02 NOTE — Patient Instructions (Addendum)
Medication Instructions:  Your physician recommends that you continue on your current medications as directed. Please refer to the Current Medication list given to you today.   Labwork: None ordered.   Testing/Procedures: None ordered.   Follow-Up: Your physician recommends that you schedule a follow-up appointment in: keep appointment for October 26, 2016 with Dr. Ladona Ridgel.   Any Other Special Instructions Will Be Listed Below (If Applicable).     If you need a refill on your cardiac medications before your next appointment, please call your pharmacy.

## 2016-10-05 ENCOUNTER — Telehealth: Payer: Self-pay | Admitting: Internal Medicine

## 2016-10-05 LAB — CUP PACEART INCLINIC DEVICE CHECK
Date Time Interrogation Session: 20180709173000
HighPow Impedance: 81 Ohm
Implantable Lead Implant Date: 20180509
Implantable Lead Model: 414005
Implantable Lead Serial Number: 49845430
Implantable Pulse Generator Implant Date: 20180509
Lead Channel Pacing Threshold Amplitude: 0.3 V
Lead Channel Pacing Threshold Pulse Width: 0.4 ms
Lead Channel Sensing Intrinsic Amplitude: 24.2 mV
MDC IDC LEAD LOCATION: 753860
MDC IDC MSMT BATTERY VOLTAGE: 3.13 V
MDC IDC MSMT LEADCHNL RV IMPEDANCE VALUE: 898 Ohm
MDC IDC PG SERIAL: 60975637
MDC IDC SET LEADCHNL RV PACING AMPLITUDE: 3 V
MDC IDC SET LEADCHNL RV PACING PULSEWIDTH: 0.4 ms
MDC IDC SET LEADCHNL RV SENSING SENSITIVITY: 0.8 mV
MDC IDC STAT BRADY RV PERCENT PACED: 0 %

## 2016-10-05 NOTE — Telephone Encounter (Signed)
Spoke to patient's sister about patient's device site. Sister states that patient's site looked fine when he saw Dr.Taylor on Monday, but patient told her that his site began to drain in the shower today. She said that he said that there was a "good amount of yellow/green drainage" that came from his site this am. She said that patient says that he feels fine. He denied any fever/chills. Will discuss with Gypsy Balsam, NP.  Per [employer] needs appt with Dr.Taylor on 7/16 to discuss extraction.   Informed Mary of Amber's recommendation. Appt scheduled for 7/16 @ 1500. I advised Corrie Dandy to make sure the patient keeps his site covered and I told her that patient needs to go to the ER if he c/o fever or chills prior to ov. Mary verbalized understanding.

## 2016-10-05 NOTE — Telephone Encounter (Signed)
Corrie Dandy ( Sister) is calling because Anthony Adams had a pacemaker placed and this morning after his shower , the area where it has been placed is infected and wants to know what should they do . Please call. Thanks

## 2016-10-05 NOTE — Telephone Encounter (Signed)
Follow up    Pt sister is calling back for pt.

## 2016-10-09 ENCOUNTER — Ambulatory Visit (INDEPENDENT_AMBULATORY_CARE_PROVIDER_SITE_OTHER): Payer: Medicare HMO | Admitting: Internal Medicine

## 2016-10-09 ENCOUNTER — Encounter (INDEPENDENT_AMBULATORY_CARE_PROVIDER_SITE_OTHER): Payer: Self-pay

## 2016-10-09 ENCOUNTER — Encounter: Payer: Self-pay | Admitting: Internal Medicine

## 2016-10-09 VITALS — BP 132/82 | HR 84 | Ht 73.0 in | Wt 235.4 lb

## 2016-10-09 DIAGNOSIS — T827XXD Infection and inflammatory reaction due to other cardiac and vascular devices, implants and grafts, subsequent encounter: Secondary | ICD-10-CM

## 2016-10-09 DIAGNOSIS — Z95810 Presence of automatic (implantable) cardiac defibrillator: Secondary | ICD-10-CM

## 2016-10-09 DIAGNOSIS — Z01812 Encounter for preprocedural laboratory examination: Secondary | ICD-10-CM

## 2016-10-09 DIAGNOSIS — I255 Ischemic cardiomyopathy: Secondary | ICD-10-CM

## 2016-10-09 NOTE — Progress Notes (Signed)
HPI The patient is a 66 yo man with a h/o DM, HTN, chronic systolic heart failure and an ICM, who underwent ICD insertion about 2 months ago. He has not had good control of his blood sugar and presented to the office 2 week ago with drainage over his ICD incision. A large amount of purulent material was removed. He had a small hole over his incision. Unclear if this represented a pocket infection or a deep stitch abscess. He has been treated with anti-biotics. He was initially better but noted additional drainage a couple of days ago and felt poorly over the weekend. No fever or chills or other symptoms.   No Known Allergies   Current Outpatient Prescriptions  Medication Sig Dispense Refill  . acetaminophen (TYLENOL) 500 MG tablet Take 1,000 mg by mouth 2 (two) times daily as needed for headache.    [name] aspirin 81 MG chewable tablet Chew 81 mg by mouth daily.    . carvedilol (COREG) 12.5 MG tablet Take 1 tablet (12.5 mg total) by mouth 2 (two) times daily. 180 tablet 3  . gabapentin (NEURONTIN) 300 MG capsule Take 600 capsules by mouth 3 (three) times daily.     [name] HYDROcodone-acetaminophen (NORCO) 10-325 MG tablet Take 1 tablet by mouth every 6 (six) hours as needed for moderate pain.     [name] losartan (COZAAR) 50 MG tablet Take 1 tablet (50 mg total) by mouth daily. 90 tablet 3  . metFORMIN (GLUCOPHAGE-XR) 500 MG 24 hr tablet Take 500 mg by mouth 2 (two) times daily.     . pravastatin (PRAVACHOL) 40 MG tablet Take 40 mg by mouth daily.     . ranitidine (ZANTAC) 150 MG tablet Take 150 mg by mouth 2 (two) times daily.     . tamsulosin (FLOMAX) 0.4 MG CAPS capsule Take 0.4 mg by mouth daily after breakfast.      No current facility-administered medications for this visit.      Past Medical History:  Diagnosis Date  . AICD (automatic cardioverter/defibrillator) present 08/02/2016  . Arthritis    " In my hands & feet "  . Back pain    Recent injection with aspirin and Plavix held,  patient had chest pain that day  . Blood glucose elevated    March, 2012  . CAD (coronary artery disease)     Chest pain while off Plavix, March, 2012, catheterization, LAD stents patent, worsening third diagonal 80%-use PCI only for recurrent significant symptoms /  ..NSTEMI..5397QBH DES LAD (kissing balloon)  . CAD (coronary artery disease)    plavix indefinitly  . CAD (coronary artery disease)    cardiac clearance .[name].for back surgery...october 2011  . Chronic systolic heart failure (HCC)   . Diabetes mellitus without complication (HCC)   . Dyslipidemia   . Gout    April, 201 to  . Hypertension    EF 50-55%, borderline anterior hypokinesis, catheterization March, 2012  . SOB (shortness of breath)     ROS:   All systems reviewed and negative except as noted in the HPI.   Past Surgical History:  Procedure Laterality Date  . BACK SURGERY    . ICD IMPLANT N/A 08/02/2016   Procedure: ICD Implant;  Surgeon: Marinus Maw, MD;  Location: [facility] INVASIVE CV LAB;  Service: Cardiovascular;  Laterality: N/A;  . INSERTION OF ICD  08/02/2016     Family History  Problem Relation Age of Onset  . Coronary artery disease Father  Social History   Social History  . Marital status: Single    Spouse name: N/A  . Number of children: N/A  . Years of education: N/A   Occupational History  . Airline pilot   Social History Main Topics  . Smoking status: Former Smoker    Packs/day: 2.00    Years: 15.00    Types: Cigarettes    Quit date: 09/24/1973  . Smokeless tobacco: Current User    Types: Snuff     Comment: "I have been dipping snuff for 25 years or more"  . Alcohol use No  . Drug use: No  . Sexual activity: Not on file   Other Topics Concern  . Not on file   Social History Narrative  . No narrative on file     BP 132/82   Pulse 84   Ht 6\' 1"  (1.854 m)   Wt 235 lb 6.4 oz (106.8 kg)   SpO2 99%   BMI 31.06 kg/m   Physical Exam:  Well  appearing 66 yo man, NAD HEENT: Unremarkable Neck:  6 cm JVD, no thyromegally Lymphatics:  No adenopathy Back:  No CVA tenderness Lungs:  Clear with no wheezes, ICD incision appears to be closed with a small thin area on the lateral aspect HEART:  Regular rate rhythm, no murmurs, no rubs, no clicks Abd:  soft, positive bowel sounds, no organomegally, no rebound, no guarding Ext:  2 plus pulses, no edema, no cyanosis, no clubbing Skin:  No rashes no nodules Neuro:  CN II through XII intact, motor grossly intact   DEVICE  Normal device function.  See PaceArt for details. He appears to have an infection.   Assess/Plan: 1. Pocket infection - he appears to have recurrent drainage and his device needs to be removed. I have discussed the treatment options with the patient. Will proceed with device removal.  2. Chronic systolic heart failure - his symptoms are well compensated. He is encouraged to use a low sodium diet. 3. HTN - he will continue his current meds. 4. ICD - I have recommended he undergo placement of a life vest after his surgery  66.D.

## 2016-10-09 NOTE — Patient Instructions (Addendum)
Medication Instructions:  Your physician recommends that you continue on your current medications as directed. Please refer to the Current Medication list given to you today.   Labwork: TODAY: BMET / CBC   Testing/Procedures: ICD Removal and Life Vest   Follow-Up: Follow-up for a wound check in the Device Clinic 10-14 days from date of procedure and with Dr. Ladona Ridgel 1 month after procedure.    Any Other Special Instructions Will Be Listed Below ---  Please wash with the CHG Soap the night before and morning of procedure (follow instruction page "Preparing For Surgery").   Report to the Main Entrance [employer] [facility] on 10/12/16 at 1:30 PM  Nothing to eat or drink after midnight the night before procedure  Do not take any medication the morning prior to procedrue  Plan 1 night stay OR You will need someone to drive you home after the procedure

## 2016-10-10 LAB — CBC WITH DIFFERENTIAL/PLATELET
BASOS: 0 %
Basophils Absolute: 0 10*3/uL (ref 0.0–0.2)
EOS (ABSOLUTE): 0.2 10*3/uL (ref 0.0–0.4)
Eos: 3 %
HEMATOCRIT: 41 % (ref 37.5–51.0)
HEMOGLOBIN: 13.8 g/dL (ref 13.0–17.7)
IMMATURE GRANS (ABS): 0 10*3/uL (ref 0.0–0.1)
Immature Granulocytes: 0 %
LYMPHS ABS: 1.7 10*3/uL (ref 0.7–3.1)
LYMPHS: 22 %
MCH: 28.2 pg (ref 26.6–33.0)
MCHC: 33.7 g/dL (ref 31.5–35.7)
MCV: 84 fL (ref 79–97)
MONOCYTES: 6 %
Monocytes Absolute: 0.5 10*3/uL (ref 0.1–0.9)
NEUTROS ABS: 5.3 10*3/uL (ref 1.4–7.0)
Neutrophils: 69 %
Platelets: 138 10*3/uL — ABNORMAL LOW (ref 150–379)
RBC: 4.9 x10E6/uL (ref 4.14–5.80)
RDW: 15.3 % (ref 12.3–15.4)
WBC: 7.8 10*3/uL (ref 3.4–10.8)

## 2016-10-10 LAB — BASIC METABOLIC PANEL
BUN / CREAT RATIO: 16 (ref 10–24)
BUN: 20 mg/dL (ref 8–27)
CALCIUM: 9.7 mg/dL (ref 8.6–10.2)
CO2: 23 mmol/L (ref 20–29)
Chloride: 102 mmol/L (ref 96–106)
Creatinine, Ser: 1.28 mg/dL — ABNORMAL HIGH (ref 0.76–1.27)
GFR, EST AFRICAN AMERICAN: 68 mL/min/{1.73_m2} (ref >59)
GFR, EST NON AFRICAN AMERICAN: 59 mL/min/{1.73_m2} — AB (ref >59)
Glucose: 126 mg/dL — ABNORMAL HIGH (ref 65–99)
Potassium: 4.5 mmol/L (ref 3.5–5.2)
Sodium: 143 mmol/L (ref 134–144)

## 2016-10-12 ENCOUNTER — Telehealth: Payer: Self-pay | Admitting: Internal Medicine

## 2016-10-12 ENCOUNTER — Ambulatory Visit (HOSPITAL_COMMUNITY)
Admission: RE | Disposition: A | Discharge status: Home / Self Care | Payer: Self-pay | Source: Ambulatory Visit | Attending: Internal Medicine

## 2016-10-12 ENCOUNTER — Ambulatory Visit (HOSPITAL_COMMUNITY)
Admission: RE | Admit: 2016-10-12 | Discharge: 2016-10-12 | Disposition: A | Discharge status: Home / Self Care | Payer: Medicare HMO | Source: Ambulatory Visit | Attending: Internal Medicine | Admitting: Internal Medicine

## 2016-10-12 DIAGNOSIS — I5022 Chronic systolic (congestive) heart failure: Secondary | ICD-10-CM | POA: Diagnosis not present

## 2016-10-12 DIAGNOSIS — Z7984 Long term (current) use of oral hypoglycemic drugs: Secondary | ICD-10-CM | POA: Diagnosis not present

## 2016-10-12 DIAGNOSIS — T827XXA Infection and inflammatory reaction due to other cardiac and vascular devices, implants and grafts, initial encounter: Secondary | ICD-10-CM

## 2016-10-12 DIAGNOSIS — Z720 Tobacco use: Secondary | ICD-10-CM | POA: Insufficient documentation

## 2016-10-12 DIAGNOSIS — Y831 Surgical operation with implant of artificial internal device as the cause of abnormal reaction of the patient, or of later complication, without mention of misadventure at the time of the procedure: Secondary | ICD-10-CM | POA: Insufficient documentation

## 2016-10-12 DIAGNOSIS — M109 Gout, unspecified: Secondary | ICD-10-CM | POA: Diagnosis not present

## 2016-10-12 DIAGNOSIS — I110 Hypertensive heart disease with heart failure: Secondary | ICD-10-CM | POA: Diagnosis not present

## 2016-10-12 DIAGNOSIS — Z7982 Long term (current) use of aspirin: Secondary | ICD-10-CM | POA: Insufficient documentation

## 2016-10-12 DIAGNOSIS — E119 Type 2 diabetes mellitus without complications: Secondary | ICD-10-CM | POA: Insufficient documentation

## 2016-10-12 DIAGNOSIS — Z8249 Family history of ischemic heart disease and other diseases of the circulatory system: Secondary | ICD-10-CM | POA: Diagnosis not present

## 2016-10-12 DIAGNOSIS — I252 Old myocardial infarction: Secondary | ICD-10-CM | POA: Insufficient documentation

## 2016-10-12 DIAGNOSIS — E785 Hyperlipidemia, unspecified: Secondary | ICD-10-CM | POA: Insufficient documentation

## 2016-10-12 DIAGNOSIS — M1990 Unspecified osteoarthritis, unspecified site: Secondary | ICD-10-CM | POA: Diagnosis not present

## 2016-10-12 DIAGNOSIS — I2510 Atherosclerotic heart disease of native coronary artery without angina pectoris: Secondary | ICD-10-CM | POA: Diagnosis not present

## 2016-10-12 DIAGNOSIS — J869 Pyothorax without fistula: Secondary | ICD-10-CM | POA: Insufficient documentation

## 2016-10-12 DIAGNOSIS — I255 Ischemic cardiomyopathy: Secondary | ICD-10-CM | POA: Diagnosis not present

## 2016-10-12 HISTORY — PX: ICD GENERATOR REMOVAL: EP1232

## 2016-10-12 HISTORY — PX: ICD LEAD REMOVAL: SHX5855

## 2016-10-12 LAB — GLUCOSE, CAPILLARY
Glucose-Capillary: 113 mg/dL — ABNORMAL HIGH (ref 65–99)
Glucose-Capillary: 88 mg/dL (ref 65–99)

## 2016-10-12 LAB — SURGICAL PCR SCREEN
MRSA, PCR: NEGATIVE
Staphylococcus aureus: NEGATIVE

## 2016-10-12 SURGERY — ICD GENERATOR REMOVAL

## 2016-10-12 MED ORDER — SODIUM CHLORIDE 0.9 % IV SOLN
INTRAVENOUS | Status: DC
  Administered 2016-10-12: 14:00:00 via INTRAVENOUS

## 2016-10-12 MED ORDER — LIDOCAINE HCL (PF) 1 % IJ SOLN
INTRAMUSCULAR | Status: AC
  Filled 2016-10-12: qty 30

## 2016-10-12 MED ORDER — SODIUM CHLORIDE 0.9 % IR SOLN
80.0000 mg | Status: AC
  Administered 2016-10-12: 80 mg

## 2016-10-12 MED ORDER — MUPIROCIN 2 % EX OINT
1.0000 "application " | TOPICAL_OINTMENT | Freq: Once | CUTANEOUS | Status: AC
  Administered 2016-10-12: 1 via TOPICAL

## 2016-10-12 MED ORDER — CHLORHEXIDINE GLUCONATE 4 % EX LIQD: 60.0000 mL | Freq: Once | CUTANEOUS | Status: DC

## 2016-10-12 MED ORDER — CEFAZOLIN SODIUM-DEXTROSE 2-4 GM/100ML-% IV SOLN
2.0000 g | INTRAVENOUS | Status: AC
  Administered 2016-10-12: 2 g via INTRAVENOUS

## 2016-10-12 MED ORDER — CEFAZOLIN SODIUM-DEXTROSE 2-4 GM/100ML-% IV SOLN
INTRAVENOUS | Status: AC
  Filled 2016-10-12: qty 100

## 2016-10-12 MED ORDER — MIDAZOLAM HCL 5 MG/5ML IJ SOLN
INTRAMUSCULAR | Status: AC
  Filled 2016-10-12: qty 5

## 2016-10-12 MED ORDER — MIDAZOLAM HCL 5 MG/5ML IJ SOLN
INTRAMUSCULAR | Status: DC | PRN
  Administered 2016-10-12: 2 mg via INTRAVENOUS
  Administered 2016-10-12: 1 mg via INTRAVENOUS

## 2016-10-12 MED ORDER — SODIUM CHLORIDE 0.9 % IR SOLN
Status: AC
  Filled 2016-10-12: qty 2

## 2016-10-12 MED ORDER — FENTANYL CITRATE (PF) 100 MCG/2ML IJ SOLN
INTRAMUSCULAR | Status: DC | PRN
  Administered 2016-10-12: 12.5 ug via INTRAVENOUS
  Administered 2016-10-12: 25 ug via INTRAVENOUS

## 2016-10-12 MED ORDER — ACETAMINOPHEN 325 MG PO TABS: 325.0000 mg | ORAL_TABLET | ORAL | Status: DC | PRN

## 2016-10-12 MED ORDER — CEPHALEXIN 500 MG PO CAPS
500.0000 mg | ORAL_CAPSULE | Freq: Two times a day (BID) | ORAL | 0 refills | Status: AC
Start: 2016-10-12 — End: 2016-10-19

## 2016-10-12 MED ORDER — LIDOCAINE HCL (PF) 1 % IJ SOLN
INTRAMUSCULAR | Status: DC | PRN
  Administered 2016-10-12: 45 mL

## 2016-10-12 MED ORDER — MUPIROCIN 2 % EX OINT
TOPICAL_OINTMENT | CUTANEOUS | Status: AC
Start: 2016-10-12 — End: 2016-10-12
  Administered 2016-10-12: 1 via TOPICAL
  Filled 2016-10-12: qty 22

## 2016-10-12 MED ORDER — FENTANYL CITRATE (PF) 100 MCG/2ML IJ SOLN
INTRAMUSCULAR | Status: AC
  Filled 2016-10-12: qty 2

## 2016-10-12 MED ORDER — HYDROCODONE-ACETAMINOPHEN 10-325 MG PO TABS
1.0000 | ORAL_TABLET | ORAL | Status: AC
  Administered 2016-10-12: 1 via ORAL
  Filled 2016-10-12: qty 1

## 2016-10-12 MED ORDER — ONDANSETRON HCL 4 MG/2ML IJ SOLN: 4.0000 mg | Freq: Four times a day (QID) | INTRAMUSCULAR | Status: DC | PRN

## 2016-10-12 SURGICAL SUPPLY — 3 items
CABLE SURGICAL S-101-97-12 (CABLE) IMPLANT
PAD DEFIB LIFELINK (PAD) ×4 IMPLANT
TRAY PACEMAKER INSERTION (PACKS) ×4 IMPLANT

## 2016-10-12 NOTE — H&P (View-Only) (Signed)
    HPI The patient is a 66 yo man with a h/o DM, HTN, chronic systolic heart failure and an ICM, who underwent ICD insertion about 2 months ago. He has not had good control of his blood sugar and presented to the office 2 week ago with drainage over his ICD incision. A large amount of purulent material was removed. He had a small hole over his incision. Unclear if this represented a pocket infection or a deep stitch abscess. He has been treated with anti-biotics. He was initially better but noted additional drainage a couple of days ago and felt poorly over the weekend. No fever or chills or other symptoms.   No Known Allergies   Current Outpatient Prescriptions  Medication Sig Dispense Refill  . acetaminophen (TYLENOL) 500 MG tablet Take 1,000 mg by mouth 2 (two) times daily as needed for headache.    . aspirin 81 MG chewable tablet Chew 81 mg by mouth daily.    . carvedilol (COREG) 12.5 MG tablet Take 1 tablet (12.5 mg total) by mouth 2 (two) times daily. 180 tablet 3  . gabapentin (NEURONTIN) 300 MG capsule Take 600 capsules by mouth 3 (three) times daily.     . HYDROcodone-acetaminophen (NORCO) 10-325 MG tablet Take 1 tablet by mouth every 6 (six) hours as needed for moderate pain.     . losartan (COZAAR) 50 MG tablet Take 1 tablet (50 mg total) by mouth daily. 90 tablet 3  . metFORMIN (GLUCOPHAGE-XR) 500 MG 24 hr tablet Take 500 mg by mouth 2 (two) times daily.     . pravastatin (PRAVACHOL) 40 MG tablet Take 40 mg by mouth daily.     . ranitidine (ZANTAC) 150 MG tablet Take 150 mg by mouth 2 (two) times daily.     . tamsulosin (FLOMAX) 0.4 MG CAPS capsule Take 0.4 mg by mouth daily after breakfast.      No current facility-administered medications for this visit.      Past Medical History:  Diagnosis Date  . AICD (automatic cardioverter/defibrillator) present 08/02/2016  . Arthritis    " In my hands & feet "  . Back pain    Recent injection with aspirin and Plavix held,  patient had chest pain that day  . Blood glucose elevated    March, 2012  . CAD (coronary artery disease)     Chest pain while off Plavix, March, 2012, catheterization, LAD stents patent, worsening third diagonal 80%-use PCI only for recurrent significant symptoms /  ..NSTEMI..2007two DES LAD (kissing balloon)  . CAD (coronary artery disease)    plavix indefinitly  . CAD (coronary artery disease)    cardiac clearance ...for back surgery...october 2011  . Chronic systolic heart failure (HCC)   . Diabetes mellitus without complication (HCC)   . Dyslipidemia   . Gout    April, 201 to  . Hypertension    EF 50-55%, borderline anterior hypokinesis, catheterization March, 2012  . SOB (shortness of breath)     ROS:   All systems reviewed and negative except as noted in the HPI.   Past Surgical History:  Procedure Laterality Date  . BACK SURGERY    . ICD IMPLANT N/A 08/02/2016   Procedure: ICD Implant;  Surgeon: Taylor, Gregg W, MD;  Location: [facility] INVASIVE CV LAB;  Service: Cardiovascular;  Laterality: N/A;  . INSERTION OF ICD  08/02/2016     Family History  Problem Relation Age of Onset  . Coronary artery disease Father        Social History   Social History  . Marital status: Single    Spouse name: N/A  . Number of children: N/A  . Years of education: N/A   Occupational History  . Airline pilot   Social History Main Topics  . Smoking status: Former Smoker    Packs/day: 2.00    Years: 15.00    Types: Cigarettes    Quit date: 09/24/1973  . Smokeless tobacco: Current User    Types: Snuff     Comment: "I have been dipping snuff for 25 years or more"  . Alcohol use No  . Drug use: No  . Sexual activity: Not on file   Other Topics Concern  . Not on file   Social History Narrative  . No narrative on file     BP 132/82   Pulse 84   Ht 6\' 1"  (1.854 m)   Wt 235 lb 6.4 oz (106.8 kg)   SpO2 99%   BMI 31.06 kg/m   Physical Exam:  Well  appearing 66 yo man, NAD HEENT: Unremarkable Neck:  6 cm JVD, no thyromegally Lymphatics:  No adenopathy Back:  No CVA tenderness Lungs:  Clear with no wheezes, ICD incision appears to be closed with a small thin area on the lateral aspect HEART:  Regular rate rhythm, no murmurs, no rubs, no clicks Abd:  soft, positive bowel sounds, no organomegally, no rebound, no guarding Ext:  2 plus pulses, no edema, no cyanosis, no clubbing Skin:  No rashes no nodules Neuro:  CN II through XII intact, motor grossly intact   DEVICE  Normal device function.  See PaceArt for details. He appears to have an infection.   Assess/Plan: 1. Pocket infection - he appears to have recurrent drainage and his device needs to be removed. I have discussed the treatment options with the patient. Will proceed with device removal.  2. Chronic systolic heart failure - his symptoms are well compensated. He is encouraged to use a low sodium diet. 3. HTN - he will continue his current meds. 4. ICD - I have recommended he undergo placement of a life vest after his surgery  66.D.

## 2016-10-12 NOTE — Discharge Instructions (Signed)
Wound care instructions Keep incision clean and dry, no showering until cleared to at your wound check visit. No driving for 2 days.  Start// pick up, antibiotics  Leave dressing in place until seen in the office for wound check appointment. Call the office [phone]) for redness, drainage, swelling, or fever.  Pacemaker Battery removal , Care After This sheet gives you information about how to care for yourself after your procedure. Your health care provider may also give you more specific instructions. If you have problems or questions, contact your health care provider. What can I expect after the procedure? After your procedure, it is common to have:  Pain or soreness at the site where the pacemaker was inserted.  Swelling at the site where the pacemaker was inserted.  Follow these instructions at home: Incision care  Keep the incision clean and dry. ? Do not take baths, swim, or use a hot tub until your health care provider approves. ? You may shower the day after your procedure, or as directed by your health care provider. ? Pat the area dry with a clean towel. Do not rub the area. This may cause bleeding.  Follow instructions from your health care provider about how to take care of your incision. Make sure you: ? Wash your hands with soap and water before you change your bandage (dressing). If soap and water are not available, use hand sanitizer. ? Change your dressing as told by your health care provider. ? Leave stitches (sutures), skin glue, or adhesive strips in place. These skin closures may need to stay in place for 2 weeks or longer. If adhesive strip edges start to loosen and curl up, you may trim the loose edges. Do not remove adhesive strips completely unless your health care provider tells you to do that.  Check your incision area every day for signs of infection. Check for: ? More redness, swelling, or pain. ? More fluid or blood. ? Warmth. ? Pus or a bad  smell. Activity  Do not lift anything that is heavier than 10 lb (4.5 kg) until your health care provider says it is okay to do so.  For the first 2 weeks, or as long as told by your health care provider: ? Avoid lifting your left arm higher than your shoulder. ? Be gentle when you move your arms over your head. It is okay to raise your arm to comb your hair. ? Avoid strenuous exercise.  Ask your health care provider when it is okay to: ? Resume your normal activities. ? Return to work or school. ? Resume sexual activity. Eating and drinking  Eat a heart-healthy diet. This should include plenty of fresh fruits and vegetables, whole grains, low-fat dairy products, and lean protein like chicken and fish.  Limit alcohol intake to no more than 1 drink a day for non-pregnant women and 2 drinks a day for men. One drink equals 12 oz of beer, 5 oz of wine, or 1 oz of hard liquor.  Check ingredients and nutrition facts on packaged foods and beverages. Avoid the following types of food: ? Food that is high in salt (sodium). ? Food that is high in saturated fat, like full-fat dairy or red meat. ? Food that is high in trans fat, like fried food. ? Food and drinks that are high in sugar. Lifestyle  Do not use any products that contain nicotine or tobacco, such as cigarettes and e-cigarettes. If you need help quitting, ask your health  care provider.  Take steps to manage and control your weight.  Get regular exercise. Aim for 150 minutes of moderate-intensity exercise (such as walking or yoga) or 75 minutes of vigorous exercise (such as running or swimming) each week.  Manage other health problems, such as diabetes or high blood pressure. Ask your health care provider how you can manage these conditions. General instructions  Do not drive for 24 hours after your procedure if you were given a medicine to help you relax (sedative).  Take over-the-counter and prescription medicines only as told  by your health care provider.  Avoid putting pressure on the area where the pacemaker was placed.  If you need an MRI after your pacemaker has been placed, be sure to tell the health care provider who orders the MRI that you have a pacemaker.  Avoid close and prolonged exposure to electrical devices that have strong magnetic fields. These include: ? Cell phones. Avoid keeping them in a pocket near the pacemaker, and try using the ear opposite the pacemaker. ? MP3 players. ? Household appliances, like microwaves. ? Metal detectors. ? Electric generators. ? High-tension wires.  Keep all follow-up visits as directed by your health care provider. This is important. Contact a health care provider if:  You have pain at the incision site that is not relieved by over-the-counter or prescription medicines.  You have any of these around your incision site or coming from it: ? More redness, swelling, or pain. ? Fluid or blood. ? Warmth to the touch. ? Pus or a bad smell.  You have a fever.  You feel brief, occasional palpitations, light-headedness, or any symptoms that you think might be related to your heart. Get help right away if:  You experience chest pain that is different from the pain at the pacemaker site.  You develop a red streak that extends above or below the incision site.  You experience shortness of breath.  You have palpitations or an irregular heartbeat.  You have light-headedness that does not go away quickly.  You faint or have dizzy spells.  Your pulse suddenly drops or increases rapidly and does not return to normal.  You begin to gain weight and your legs and ankles swell. Summary  After your procedure, it is common to have pain, soreness, and some swelling where the pacemaker was inserted.  Make sure to keep your incision clean and dry. Follow instructions from your health care provider about how to take care of your incision.  Check your incision every  day for signs of infection, such as more pain or swelling, pus or a bad smell, warmth, or leaking fluid and blood.  Avoid strenuous exercise and lifting your left arm higher than your shoulder for 2 weeks, or as long as told by your health care provider. This information is not intended to replace advice given to you by your health care provider. Make sure you discuss any questions you have with your health care provider. Document Released: 01/01/2013 Document Revised: 02/03/2016 Document Reviewed: 02/03/2016 Elsevier Interactive Patient Education  2017 [employer].

## 2016-10-12 NOTE — Progress Notes (Addendum)
bulk dressing was removed from left anterior chest post ICD explant. Sterile gauze and Tegaderm was placed.  Thersa Salt RN for life vest is here to place and give instructions.

## 2016-10-12 NOTE — Telephone Encounter (Signed)
New message      Calling to let Dr Ladona Ridgel know that stat ICD request has been approved by Dr Luisa Hart and the approval code will be sent to you within the next 2 hrs

## 2016-10-12 NOTE — Interval H&P Note (Signed)
History and Physical Interval Note:  10/12/2016 2:03 PM  Anthony Adams  has presented today for surgery, with the diagnosis of infection  The various methods of treatment have been discussed with the patient and family. After consideration of risks, benefits and other options for treatment, the patient has consented to  Procedure(s): ICD Generator Removal (N/A) as a surgical intervention .  The patient's history has been reviewed, patient examined, no change in status, stable for surgery.  I have reviewed the patient's chart and labs.  Questions were answered to the patient's satisfaction.     Lewayne Bunting

## 2016-10-13 ENCOUNTER — Encounter (HOSPITAL_COMMUNITY): Payer: Self-pay | Admitting: Internal Medicine

## 2016-10-13 MED FILL — Sodium Chloride Irrigation Soln 0.9%: Qty: 500 | Status: AC

## 2016-10-13 MED FILL — Gentamicin Sulfate Inj 40 MG/ML: INTRAMUSCULAR | Qty: 2 | Status: AC

## 2016-10-16 ENCOUNTER — Telehealth: Payer: Self-pay | Admitting: Internal Medicine

## 2016-10-16 NOTE — Telephone Encounter (Signed)
New message   [employer] calling regarding life vest - trying to get approval

## 2016-10-23 NOTE — Telephone Encounter (Signed)
Form faxed in.  ?

## 2016-10-25 ENCOUNTER — Ambulatory Visit (INDEPENDENT_AMBULATORY_CARE_PROVIDER_SITE_OTHER): Payer: Self-pay | Admitting: *Deleted

## 2016-10-25 VITALS — Temp 97.6°F

## 2016-10-25 DIAGNOSIS — T827XXD Infection and inflammatory reaction due to other cardiac and vascular devices, implants and grafts, subsequent encounter: Secondary | ICD-10-CM

## 2016-10-25 NOTE — Progress Notes (Signed)
Wound check in clinic, s/p ICD extraction.  Life Vest in place.  Stitches removed from site.  Incision edges approximated and healing well.  Patient denies drainage, tenderness, or fever/chills.  Afebrile today.  Patient finished course of antibiotics.  Dr. Graciela Husbands saw patient, advised to leave wound open to air, recommended washing site with soap and water.  Patient educated about wound care.  ROV with Dr. Ladona Ridgel on 11/17/16.

## 2016-10-25 NOTE — Patient Instructions (Signed)
Please call the Device Clinic at [phone] if you notice any signs or symptoms of infection, including redness, drainage, swelling, fever, or chills.

## 2016-10-26 ENCOUNTER — Encounter: Payer: Medicare Other | Admitting: Internal Medicine

## 2016-11-06 ENCOUNTER — Telehealth: Payer: Self-pay | Admitting: Internal Medicine

## 2016-11-06 ENCOUNTER — Ambulatory Visit (INDEPENDENT_AMBULATORY_CARE_PROVIDER_SITE_OTHER): Payer: Self-pay | Admitting: *Deleted

## 2016-11-06 ENCOUNTER — Telehealth: Payer: Self-pay | Admitting: *Deleted

## 2016-11-06 VITALS — Temp 97.5°F

## 2016-11-06 DIAGNOSIS — T827XXD Infection and inflammatory reaction due to other cardiac and vascular devices, implants and grafts, subsequent encounter: Secondary | ICD-10-CM

## 2016-11-06 MED ORDER — CEPHALEXIN 500 MG PO CAPS: 500.0000 mg | ORAL_CAPSULE | Freq: Three times a day (TID) | ORAL | 0 refills | Status: AC

## 2016-11-06 NOTE — Patient Instructions (Signed)
Per Dr. Graciela Husbands, every 2-3 days, please repack the wound.  If you note any symptoms such as fever/chills or worsening drainage or pain, please call the Device Clinic at [phone].

## 2016-11-06 NOTE — Telephone Encounter (Signed)
New message  Pt daughter call requesting to speak with RN. Pt daughter states pt had some draining at his wound site on yesterday. She states pt has not had any since yesterday but would like to speak with RN. Lease call back to discuss

## 2016-11-06 NOTE — Telephone Encounter (Signed)
Ms. Anthony Adams (patient's sister- DPR) said that yesterday [name] felt a pinch at the ICD extraction site and then drainage that looked like "infection" and then dark blood came out of the site. Mr. Rufo cleaned the site and then put a new bandage on it and has not had any drainage since yesterday. He is sore around the site and into his arm. I advised that he come to [facility] for a wound check with the Device Clinic/ Dr. Graciela Husbands. Ms. Anthony Adams says she will have him here.

## 2016-11-06 NOTE — Telephone Encounter (Signed)
After patient's earlier wound check appointment, Dr. Graciela Husbands decided patient should restart antibiotics due to ongoing drainage from ICD explant site.  Verbal order given for cephalexin 500mg  TID x10 days.  Order sent electronically to patient's preferred pharmacy [facility]  Patient verbalizes understanding of instructions.  He is aware that he should try to pick up prescription today and take first dose tonight.  He agrees to finish full course of antibiotics unless instructed otherwise.  Patient is appreciative of call and denies any additional questions or concerns at this time.

## 2016-11-06 NOTE — Progress Notes (Signed)
Wound recheck in clinic, s/p ICD extraction.  Life Vest in place.  Patient previously finished course of PO antibiotics.  Patient called this AM to report opaque brownish-yellow to sanguineous drainage from left lateral incision on 11/05/16, since resolved.  Oral temperature 97.5 degrees Farenheit.  Incision edges approximated and well healed upon presentation, with exception of scab over left lateral incision.  Dr. Graciela Husbands removed scab, noted moderate amount of sanguineous drainage.  Dr. Graciela Husbands packed wound with ~4" of iodoform gauze, then covered with large bandaid.  Plan for patient's sister to change dressing and repack wound every 2-3 days at home following education performed today by Dr. Graciela Husbands.  Patient and sister will plan to call the Device Clinic if patient notes any additional symptoms, such as fever/chills, worsening drainage, or pain, or if they have any difficulties changing dressing at home.  Patient given all necessary dressing supplies, including iodoform gauze, large bandages, and cotton-tipped applicators.  Patient and sister verbalize understanding of all instructions and deny additional questions or concerns at this time.  Plan for ROV with Dr. Ladona Ridgel on 11/17/16 as scheduled.

## 2016-11-06 NOTE — Telephone Encounter (Signed)
Anthony Adams denies fever/chills.

## 2016-11-15 ENCOUNTER — Telehealth: Payer: Self-pay

## 2016-11-15 NOTE — Telephone Encounter (Signed)
Call received from insurance reviewer for Pt's life vest.  Insurance asked if Pt has been compliant with wearing his life vest as prescribed.  Call placed to Pt.  Spoke with Pt's wife.  Per wife Pt has worn life vest as directed.  Will fax to insurance reviewer.

## 2016-11-17 ENCOUNTER — Encounter (INDEPENDENT_AMBULATORY_CARE_PROVIDER_SITE_OTHER): Payer: Self-pay

## 2016-11-17 ENCOUNTER — Encounter: Payer: Self-pay | Admitting: Internal Medicine

## 2016-11-17 ENCOUNTER — Ambulatory Visit (INDEPENDENT_AMBULATORY_CARE_PROVIDER_SITE_OTHER): Payer: Medicare HMO | Admitting: Internal Medicine

## 2016-11-17 VITALS — BP 162/90 | HR 62 | Ht 73.0 in | Wt 243.6 lb

## 2016-11-17 DIAGNOSIS — I255 Ischemic cardiomyopathy: Secondary | ICD-10-CM

## 2016-11-17 DIAGNOSIS — I5022 Chronic systolic (congestive) heart failure: Secondary | ICD-10-CM

## 2016-11-17 NOTE — Patient Instructions (Addendum)
Medication Instructions:  Your physician recommends that you continue on your current medications as directed. Please refer to the Current Medication list given to you today.   Labwork: None ordered.  Testing/Procedures: None ordered.  Follow-Up: Your physician wants you to follow-up in: 4 weeks with Dr. Ladona Ridgel.    Any Other Special Instructions Will Be Listed Below (If Applicable).  Wash your incision twice a day and dry.    If you need a refill on your cardiac medications before your next appointment, please call your pharmacy.

## 2016-11-17 NOTE — Progress Notes (Signed)
HPI [name] returns today for follow-up of his chronic systolic heart failure. The patient is a 66 year old man with a history of chronic systolic heart failure, EF 30% by echo, status post ICD implantation. Approximately 3 months after his procedure, he developed a stitch abscess which appeared to track downward resulting in contamination of his ICD pocket, resulting in a device infection which required removal. He has healed by secondary intention. He denies fevers or chills. He is wearing a life vest. No syncope. He has class II heart failure symptoms. No Known Allergies   Current Outpatient Prescriptions  Medication Sig Dispense Refill  . acetaminophen (TYLENOL) 500 MG tablet Take 1,000 mg by mouth 2 (two) times daily as needed for headache.    . carvedilol (COREG) 12.5 MG tablet Take 1 tablet (12.5 mg total) by mouth 2 (two) times daily. 180 tablet 3  . gabapentin (NEURONTIN) 600 MG tablet Take 600 mg by mouth 4 (four) times daily.  2  . HYDROcodone-acetaminophen (NORCO) 10-325 MG tablet Take 1 tablet by mouth every 6 (six) hours as needed for moderate pain.     [name] losartan (COZAAR) 50 MG tablet Take 1 tablet (50 mg total) by mouth daily. 90 tablet 3  . metFORMIN (GLUCOPHAGE-XR) 500 MG 24 hr tablet Take 500 mg by mouth 2 (two) times daily.     . pravastatin (PRAVACHOL) 40 MG tablet Take 40 mg by mouth daily.     . QC LO-DOSE ASPIRIN 81 MG EC tablet Take 81 mg by mouth daily.  12  . ranitidine (ZANTAC) 150 MG tablet Take 150 mg by mouth 2 (two) times daily.     . tamsulosin (FLOMAX) 0.4 MG CAPS capsule Take 0.4 mg by mouth daily after breakfast.      No current facility-administered medications for this visit.      Past Medical History:  Diagnosis Date  . AICD (automatic cardioverter/defibrillator) present 08/02/2016  . Arthritis    " In my hands & feet "  . Back pain    Recent injection with aspirin and Plavix held, patient had chest pain that day  . Blood glucose elevated     March, 2012  . CAD (coronary artery disease)     Chest pain while off Plavix, March, 2012, catheterization, LAD stents patent, worsening third diagonal 80%-use PCI only for recurrent significant symptoms /  ..NSTEMI..3716RCV DES LAD (kissing balloon)  . CAD (coronary artery disease)    plavix indefinitly  . CAD (coronary artery disease)    cardiac clearance .[name].for back surgery...october 2011  . Chronic systolic heart failure (HCC)   . Diabetes mellitus without complication (HCC)   . Dyslipidemia   . Gout    April, 201 to  . Hypertension    EF 50-55%, borderline anterior hypokinesis, catheterization March, 2012  . SOB (shortness of breath)     ROS:   All systems reviewed and negative except as noted in the HPI.   Past Surgical History:  Procedure Laterality Date  . BACK SURGERY    . ICD GENERATOR REMOVAL N/A 10/12/2016   Procedure: ICD Generator Removal;  Surgeon: Marinus Maw, MD;  Location: [facility] INVASIVE CV LAB;  Service: Cardiovascular;  Laterality: N/A;  . ICD IMPLANT N/A 08/02/2016   Procedure: ICD Implant;  Surgeon: Marinus Maw, MD;  Location: [facility] INVASIVE CV LAB;  Service: Cardiovascular;  Laterality: N/A;  . ICD LEAD REMOVAL  10/12/2016   Procedure: Icd Lead Removal;  Surgeon: Lewayne Bunting  W, MD;  Location: [facility] INVASIVE CV LAB;  Service: Cardiovascular;;  . INSERTION OF ICD  08/02/2016     Family History  Problem Relation Age of Onset  . Coronary artery disease Father      Social History   Social History  . Marital status: Single    Spouse name: N/A  . Number of children: N/A  . Years of education: N/A   Occupational History  . Airline pilot   Social History Main Topics  . Smoking status: Former Smoker    Packs/day: 2.00    Years: 15.00    Types: Cigarettes    Quit date: 09/24/1973  . Smokeless tobacco: Current User    Types: Snuff     Comment: "I have been dipping snuff for 25 years or more"  . Alcohol use No  . Drug use: No    . Sexual activity: Not on file   Other Topics Concern  . Not on file   Social History Narrative  . No narrative on file     BP (!) 162/90   Pulse 62   Ht 6\' 1"  (1.854 m)   Wt 243 lb 9.6 oz (110.5 kg)   SpO2 98%   BMI 32.14 kg/m   Physical Exam:  Diskempt appearing 66 year old man, wearing a life vest, NAD HEENT: Unremarkable Neck:  7 cm JVD, no thyromegally Lymphatics:  No adenopathy Back:  No CVA tenderness Lungs:  Clear, with no wheezes, rales, or rhonchi. He has a small 5 mm open area on the lateral aspect of his ICD incision with no drainage. He is wearing a life vest. HEART:  Regular rate rhythm, no murmurs, no rubs, no clicks Abd:  soft, positive bowel sounds, no organomegally, no rebound, no guarding Ext:  2 plus pulses, no edema, no cyanosis, no clubbing Skin:  No rashes no nodules Neuro:  CN II through XII intact, motor grossly intact  EKG - normal sinus rhythm with frequent PVCs.   Assess/Plan: 1. Chronic systolic heart failure - his symptoms remain class II. He is encouraged to maintain a low-sodium diet and he will continue his current medications. 2. PVCs - he is asymptomatic with his PVCs. He will continue his current medications. 3. Coronary artery disease - he is status post stenting, and has no anginal symptoms. He will continue his current medications. 4. Diabetes - previously his blood sugars had not been well-controlled, although he currently notes that these are better. 5. ICD pocket infection - he is status post extraction and continues to heal slowly by secondary intention. I've asked the patient to wash his wound twice a day with hot soapy water, and dry, stimulating the open portion of the incision. I expected to heal by secondary intention. Consideration for repeat ICD insertion will be made once his incision is healed completely. For now he will continue wearing a life vest.  66, M.D.

## 2016-12-14 ENCOUNTER — Ambulatory Visit (INDEPENDENT_AMBULATORY_CARE_PROVIDER_SITE_OTHER): Payer: Medicare HMO | Admitting: Internal Medicine

## 2016-12-14 ENCOUNTER — Encounter: Payer: Self-pay | Admitting: Internal Medicine

## 2016-12-14 VITALS — BP 136/72 | HR 59 | Ht 73.0 in | Wt 235.0 lb

## 2016-12-14 DIAGNOSIS — I5022 Chronic systolic (congestive) heart failure: Secondary | ICD-10-CM | POA: Diagnosis not present

## 2016-12-14 DIAGNOSIS — I519 Heart disease, unspecified: Secondary | ICD-10-CM

## 2016-12-14 DIAGNOSIS — I255 Ischemic cardiomyopathy: Secondary | ICD-10-CM | POA: Diagnosis not present

## 2016-12-14 NOTE — Patient Instructions (Signed)
Medication Instructions:  Your physician recommends that you continue on your current medications as directed. Please refer to the Current Medication list given to you today.   Labwork: You will get lab work today-BMET and CBC.  Testing/Procedures: Your physician has recommended that you have a defibrillator inserted. An implantable cardioverter defibrillator (ICD) is a small device that is placed in your chest or, in rare cases, your abdomen. This device uses electrical pulses or shocks to help control life-threatening, irregular heartbeats that could lead the heart to suddenly stop beating (sudden cardiac arrest). Leads are attached to the ICD that goes into your heart. This is done in the hospital and usually requires an overnight stay. Please see the instruction sheet given to you today for more information.   Follow-Up: You will have a follow up appointment with the device clinic 10-14 days after your procedure for a wound check.  You will follow up with Dr. Ladona Ridgel 91 days after your procedure.  Any Other Special Instructions Will Be Listed Below (If Applicable).  Please arrive at the [facility] main entrance of [facility] hospital at:  12:30 pm.  You may have a light breakfast, nothing to eat after 7:30 am. Do not take any medications the morning of the procedure Use the CHG scrub as directed Plan for one night stay You will need someone to drive you home at discharge      If you need a refill on your cardiac medications before your next appointment, please call your pharmacy.

## 2016-12-14 NOTE — Progress Notes (Signed)
    HPI [name] returns today for ongoing evaluation and management of his chronic systolic heart failure, ischemic cardiomyopathy, status post ICD implantation, who then developed an early device infection, and was explanted. In the interim, he has been stable. He denies any ICD shock. He is wearing a life vest. Heart failure symptoms are well compensated. He denies fevers or chills. No Known Allergies   Current Outpatient Prescriptions  Medication Sig Dispense Refill  . acetaminophen (TYLENOL) 500 MG tablet Take 1,000 mg by mouth 2 (two) times daily as needed for headache.    . carvedilol (COREG) 12.5 MG tablet Take 1 tablet (12.5 mg total) by mouth 2 (two) times daily. 180 tablet 3  . gabapentin (NEURONTIN) 600 MG tablet Take 600 mg by mouth 4 (four) times daily.  2  . HYDROcodone-acetaminophen (NORCO) 10-325 MG tablet Take 1 tablet by mouth every 6 (six) hours as needed for moderate pain.     . losartan (COZAAR) 50 MG tablet Take 1 tablet (50 mg total) by mouth daily. 90 tablet 3  . metFORMIN (GLUCOPHAGE-XR) 500 MG 24 hr tablet Take 500 mg by mouth 2 (two) times daily.     . pravastatin (PRAVACHOL) 40 MG tablet Take 40 mg by mouth daily.     . QC LO-DOSE ASPIRIN 81 MG EC tablet Take 81 mg by mouth daily.  12  . ranitidine (ZANTAC) 150 MG tablet Take 150 mg by mouth 2 (two) times daily.     . tamsulosin (FLOMAX) 0.4 MG CAPS capsule Take 0.4 mg by mouth daily after breakfast.      No current facility-administered medications for this visit.      Past Medical History:  Diagnosis Date  . AICD (automatic cardioverter/defibrillator) present 08/02/2016  . Arthritis    " In my hands & feet "  . Back pain    Recent injection with aspirin and Plavix held, patient had chest pain that day  . Blood glucose elevated    March, 2012  . CAD (coronary artery disease)     Chest pain while off Plavix, March, 2012, catheterization, LAD stents patent, worsening third diagonal 80%-use PCI only  for recurrent significant symptoms /  ..NSTEMI..2007two DES LAD (kissing balloon)  . CAD (coronary artery disease)    plavix indefinitly  . CAD (coronary artery disease)    cardiac clearance ...for back surgery...october 2011  . Chronic systolic heart failure (HCC)   . Diabetes mellitus without complication (HCC)   . Dyslipidemia   . Gout    April, 201 to  . Hypertension    EF 50-55%, borderline anterior hypokinesis, catheterization March, 2012  . SOB (shortness of breath)     ROS:   All systems reviewed and negative except as noted in the HPI.   Past Surgical History:  Procedure Laterality Date  . BACK SURGERY    . ICD GENERATOR REMOVAL N/A 10/12/2016   Procedure: ICD Generator Removal;  Surgeon: Conlan Miceli W, MD;  Location: [facility] INVASIVE CV LAB;  Service: Cardiovascular;  Laterality: N/A;  . ICD IMPLANT N/A 08/02/2016   Procedure: ICD Implant;  Surgeon: Patirica Longshore W, MD;  Location: [facility] INVASIVE CV LAB;  Service: Cardiovascular;  Laterality: N/A;  . ICD LEAD REMOVAL  10/12/2016   Procedure: Icd Lead Removal;  Surgeon: Marco Adelson W, MD;  Location: [facility] INVASIVE CV LAB;  Service: Cardiovascular;;  . INSERTION OF ICD  08/02/2016     Family History  Problem Relation Age of Onset  .   Coronary artery disease Father      Social History   Social History  . Marital status: Single    Spouse name: N/A  . Number of children: N/A  . Years of education: N/A   Occupational History  . Airline pilot   Social History Main Topics  . Smoking status: Former Smoker    Packs/day: 2.00    Years: 15.00    Types: Cigarettes    Quit date: 09/24/1973  . Smokeless tobacco: Current User    Types: Snuff     Comment: "I have been dipping snuff for 25 years or more"  . Alcohol use No  . Drug use: No  . Sexual activity: Not on file   Other Topics Concern  . Not on file   Social History Narrative  . No narrative on file     BP 136/72   Pulse (!) 59   Ht 6\' 1"   (1.854 m)   Wt 235 lb (106.6 kg)   BMI 31.00 kg/m   Physical Exam:  Well appearing 66 year old man, NAD HEENT: Unremarkable Neck:  No JVD, no thyromegally Lungs:  Clear, incision is well-healed on the left. Neuro:  CN II through XII intact, motor grossly intact   Assess/Plan: 1. Chronic systolic heart failure - his symptoms appear to be well compensated 2. Ischemic cardio myopathy - he denies anginal symptoms. 3. Prior ICD system infection status post removal - his incision is healed nicely. We discussed the pros and cons of undergoing device implantation on the contralateral side, versus insertion of a subcutaneous device, and he would like to proceed with contralateral device insertion.  66, M.D.

## 2016-12-15 LAB — BASIC METABOLIC PANEL
BUN / CREAT RATIO: 14 (ref 10–24)
BUN: 19 mg/dL (ref 8–27)
CO2: 22 mmol/L (ref 20–29)
Calcium: 9.7 mg/dL (ref 8.6–10.2)
Chloride: 102 mmol/L (ref 96–106)
Creatinine, Ser: 1.4 mg/dL — ABNORMAL HIGH (ref 0.76–1.27)
GFR calc Af Amer: 61 mL/min/{1.73_m2} (ref >59)
GFR calc non Af Amer: 53 mL/min/{1.73_m2} — ABNORMAL LOW (ref >59)
GLUCOSE: 114 mg/dL — AB (ref 65–99)
Potassium: 4.2 mmol/L (ref 3.5–5.2)
SODIUM: 141 mmol/L (ref 134–144)

## 2016-12-15 LAB — CBC
HEMOGLOBIN: 14 g/dL (ref 13.0–17.7)
Hematocrit: 41.6 % (ref 37.5–51.0)
MCH: 28.2 pg (ref 26.6–33.0)
MCHC: 33.7 g/dL (ref 31.5–35.7)
MCV: 84 fL (ref 79–97)
PLATELETS: 155 10*3/uL (ref 150–379)
RBC: 4.96 x10E6/uL (ref 4.14–5.80)
RDW: 15.2 % (ref 12.3–15.4)
WBC: 7.3 10*3/uL (ref 3.4–10.8)

## 2016-12-20 ENCOUNTER — Ambulatory Visit (HOSPITAL_COMMUNITY)
Admission: RE | Admit: 2016-12-20 | Discharge: 2016-12-21 | Disposition: A | Discharge status: Home / Self Care | Payer: Medicare HMO | Source: Ambulatory Visit | Attending: Internal Medicine | Admitting: Internal Medicine

## 2016-12-20 ENCOUNTER — Encounter (HOSPITAL_COMMUNITY): Payer: Self-pay | Admitting: General Practice

## 2016-12-20 ENCOUNTER — Encounter (HOSPITAL_COMMUNITY)
Admission: RE | Disposition: A | Discharge status: Home / Self Care | Payer: Self-pay | Source: Ambulatory Visit | Attending: Internal Medicine

## 2016-12-20 DIAGNOSIS — E119 Type 2 diabetes mellitus without complications: Secondary | ICD-10-CM | POA: Diagnosis not present

## 2016-12-20 DIAGNOSIS — Z23 Encounter for immunization: Secondary | ICD-10-CM | POA: Insufficient documentation

## 2016-12-20 DIAGNOSIS — M19042 Primary osteoarthritis, left hand: Secondary | ICD-10-CM | POA: Insufficient documentation

## 2016-12-20 DIAGNOSIS — I5022 Chronic systolic (congestive) heart failure: Secondary | ICD-10-CM | POA: Diagnosis present

## 2016-12-20 DIAGNOSIS — E785 Hyperlipidemia, unspecified: Secondary | ICD-10-CM | POA: Diagnosis not present

## 2016-12-20 DIAGNOSIS — M109 Gout, unspecified: Secondary | ICD-10-CM | POA: Diagnosis not present

## 2016-12-20 DIAGNOSIS — I252 Old myocardial infarction: Secondary | ICD-10-CM | POA: Insufficient documentation

## 2016-12-20 DIAGNOSIS — I2510 Atherosclerotic heart disease of native coronary artery without angina pectoris: Secondary | ICD-10-CM | POA: Insufficient documentation

## 2016-12-20 DIAGNOSIS — M19041 Primary osteoarthritis, right hand: Secondary | ICD-10-CM | POA: Insufficient documentation

## 2016-12-20 DIAGNOSIS — Z95810 Presence of automatic (implantable) cardiac defibrillator: Secondary | ICD-10-CM

## 2016-12-20 DIAGNOSIS — M19071 Primary osteoarthritis, right ankle and foot: Secondary | ICD-10-CM | POA: Insufficient documentation

## 2016-12-20 DIAGNOSIS — I255 Ischemic cardiomyopathy: Secondary | ICD-10-CM

## 2016-12-20 DIAGNOSIS — Z7982 Long term (current) use of aspirin: Secondary | ICD-10-CM | POA: Insufficient documentation

## 2016-12-20 DIAGNOSIS — M19072 Primary osteoarthritis, left ankle and foot: Secondary | ICD-10-CM | POA: Diagnosis not present

## 2016-12-20 DIAGNOSIS — I110 Hypertensive heart disease with heart failure: Secondary | ICD-10-CM | POA: Insufficient documentation

## 2016-12-20 DIAGNOSIS — Z7984 Long term (current) use of oral hypoglycemic drugs: Secondary | ICD-10-CM | POA: Diagnosis not present

## 2016-12-20 DIAGNOSIS — Z720 Tobacco use: Secondary | ICD-10-CM | POA: Diagnosis not present

## 2016-12-20 HISTORY — DX: Type 2 diabetes mellitus without complications: E11.9

## 2016-12-20 HISTORY — PX: ICD IMPLANT: EP1208

## 2016-12-20 HISTORY — PX: CARDIAC DEFIBRILLATOR PLACEMENT: SHX171

## 2016-12-20 LAB — GLUCOSE, CAPILLARY
GLUCOSE-CAPILLARY: 109 mg/dL — AB (ref 65–99)
Glucose-Capillary: 124 mg/dL — ABNORMAL HIGH (ref 65–99)

## 2016-12-20 LAB — SURGICAL PCR SCREEN
MRSA, PCR: NEGATIVE
STAPHYLOCOCCUS AUREUS: NEGATIVE

## 2016-12-20 SURGERY — ICD IMPLANT

## 2016-12-20 MED ORDER — CHLORHEXIDINE GLUCONATE 4 % EX LIQD: 60.0000 mL | Freq: Once | CUTANEOUS | Status: DC

## 2016-12-20 MED ORDER — ASPIRIN EC 81 MG PO TBEC
81.0000 mg | DELAYED_RELEASE_TABLET | Freq: Every day | ORAL | Status: DC
  Administered 2016-12-20 – 2016-12-21 (×2): 81 mg via ORAL
  Filled 2016-12-20 (×2): qty 1

## 2016-12-20 MED ORDER — SODIUM CHLORIDE 0.9 % IR SOLN
Status: AC
  Filled 2016-12-20: qty 2

## 2016-12-20 MED ORDER — GABAPENTIN 600 MG PO TABS
600.0000 mg | ORAL_TABLET | Freq: Four times a day (QID) | ORAL | Status: DC
Start: 2016-12-20 — End: 2016-12-21
  Administered 2016-12-20 – 2016-12-21 (×3): 600 mg via ORAL
  Filled 2016-12-20 (×3): qty 1

## 2016-12-20 MED ORDER — SODIUM CHLORIDE 0.9 % IV SOLN
INTRAVENOUS | Status: DC
  Administered 2016-12-20: 14:00:00 via INTRAVENOUS

## 2016-12-20 MED ORDER — CARVEDILOL 12.5 MG PO TABS
12.5000 mg | ORAL_TABLET | Freq: Two times a day (BID) | ORAL | Status: DC
  Administered 2016-12-20 – 2016-12-21 (×2): 12.5 mg via ORAL
  Filled 2016-12-20 (×2): qty 1

## 2016-12-20 MED ORDER — METFORMIN HCL ER 500 MG PO TB24
500.0000 mg | ORAL_TABLET | Freq: Two times a day (BID) | ORAL | Status: DC
  Administered 2016-12-20 – 2016-12-21 (×2): 500 mg via ORAL
  Filled 2016-12-20 (×2): qty 1

## 2016-12-20 MED ORDER — TAMSULOSIN HCL 0.4 MG PO CAPS
0.4000 mg | ORAL_CAPSULE | Freq: Every day | ORAL | Status: DC
  Administered 2016-12-21: 0.4 mg via ORAL
  Filled 2016-12-20: qty 1

## 2016-12-20 MED ORDER — ACETAMINOPHEN 500 MG PO TABS: 1000.0000 mg | ORAL_TABLET | Freq: Two times a day (BID) | ORAL | Status: DC | PRN

## 2016-12-20 MED ORDER — HYDROCODONE-ACETAMINOPHEN 10-325 MG PO TABS
1.0000 | ORAL_TABLET | Freq: Four times a day (QID) | ORAL | Status: DC | PRN
Start: 2016-12-20 — End: 2016-12-21
  Administered 2016-12-20 – 2016-12-21 (×3): 1 via ORAL
  Filled 2016-12-20 (×3): qty 1

## 2016-12-20 MED ORDER — CEFAZOLIN SODIUM-DEXTROSE 2-4 GM/100ML-% IV SOLN
INTRAVENOUS | Status: AC
  Filled 2016-12-20: qty 100

## 2016-12-20 MED ORDER — PRAVASTATIN SODIUM 40 MG PO TABS
40.0000 mg | ORAL_TABLET | Freq: Every day | ORAL | Status: DC
  Administered 2016-12-20 – 2016-12-21 (×2): 40 mg via ORAL
  Filled 2016-12-20 (×2): qty 1

## 2016-12-20 MED ORDER — CEFAZOLIN SODIUM-DEXTROSE 2-4 GM/100ML-% IV SOLN
2.0000 g | INTRAVENOUS | Status: AC
  Administered 2016-12-20: 2 g via INTRAVENOUS

## 2016-12-20 MED ORDER — LIDOCAINE HCL (PF) 1 % IJ SOLN
INTRAMUSCULAR | Status: DC | PRN
  Administered 2016-12-20: 30 mL

## 2016-12-20 MED ORDER — MUPIROCIN 2 % EX OINT
TOPICAL_OINTMENT | CUTANEOUS | Status: AC
  Administered 2016-12-20: 1
  Filled 2016-12-20: qty 22

## 2016-12-20 MED ORDER — LOSARTAN POTASSIUM 50 MG PO TABS
50.0000 mg | ORAL_TABLET | Freq: Every day | ORAL | Status: DC
  Administered 2016-12-20 – 2016-12-21 (×2): 50 mg via ORAL
  Filled 2016-12-20 (×2): qty 1

## 2016-12-20 MED ORDER — GENTAMICIN SULFATE 40 MG/ML IJ SOLN
80.0000 mg | INTRAMUSCULAR | Status: AC
  Administered 2016-12-20: 80 mg

## 2016-12-20 MED ORDER — HEPARIN (PORCINE) IN NACL 2-0.9 UNIT/ML-% IJ SOLN
INTRAMUSCULAR | Status: AC
  Filled 2016-12-20: qty 500

## 2016-12-20 MED ORDER — ACETAMINOPHEN 325 MG PO TABS: 325.0000 mg | ORAL_TABLET | ORAL | Status: DC | PRN

## 2016-12-20 MED ORDER — FENTANYL CITRATE (PF) 100 MCG/2ML IJ SOLN
INTRAMUSCULAR | Status: DC | PRN
  Administered 2016-12-20: 12.5 ug via INTRAVENOUS
  Administered 2016-12-20: 25 ug via INTRAVENOUS

## 2016-12-20 MED ORDER — MIDAZOLAM HCL 5 MG/5ML IJ SOLN
INTRAMUSCULAR | Status: AC
  Filled 2016-12-20: qty 5

## 2016-12-20 MED ORDER — FENTANYL CITRATE (PF) 100 MCG/2ML IJ SOLN
INTRAMUSCULAR | Status: AC
  Filled 2016-12-20: qty 2

## 2016-12-20 MED ORDER — MIDAZOLAM HCL 5 MG/5ML IJ SOLN
INTRAMUSCULAR | Status: DC | PRN
  Administered 2016-12-20 (×3): 1 mg via INTRAVENOUS

## 2016-12-20 MED ORDER — HEPARIN (PORCINE) IN NACL 2-0.9 UNIT/ML-% IJ SOLN
INTRAMUSCULAR | Status: AC | PRN
  Administered 2016-12-20: 500 mL

## 2016-12-20 MED ORDER — BUPIVACAINE HCL (PF) 0.25 % IJ SOLN
INTRAMUSCULAR | Status: AC
  Filled 2016-12-20: qty 30

## 2016-12-20 MED ORDER — ONDANSETRON HCL 4 MG/2ML IJ SOLN: 4.0000 mg | Freq: Four times a day (QID) | INTRAMUSCULAR | Status: DC | PRN

## 2016-12-20 MED ORDER — OFF THE BEAT BOOK
Freq: Once | Status: AC
  Administered 2016-12-20: 17:00:00
  Filled 2016-12-20: qty 1

## 2016-12-20 MED ORDER — CEFAZOLIN SODIUM-DEXTROSE 1-4 GM/50ML-% IV SOLN
1.0000 g | Freq: Four times a day (QID) | INTRAVENOUS | Status: AC
  Administered 2016-12-20 – 2016-12-21 (×3): 1 g via INTRAVENOUS
  Filled 2016-12-20 (×3): qty 50

## 2016-12-20 MED ORDER — INFLUENZA VAC SPLIT QUAD 0.5 ML IM SUSY
0.5000 mL | PREFILLED_SYRINGE | INTRAMUSCULAR | Status: AC
  Administered 2016-12-21: 0.5 mL via INTRAMUSCULAR
  Filled 2016-12-20: qty 0.5

## 2016-12-20 SURGICAL SUPPLY — 6 items
CABLE SURGICAL S-101-97-12 (CABLE) ×3 IMPLANT
ICD INTICA DX MRI 404633 (ICD Generator) ×3 IMPLANT
LEAD PLEXA S DX 65/15 414005 (Lead) ×3 IMPLANT
PAD DEFIB LIFELINK (PAD) ×3 IMPLANT
SHEATH CLASSIC 9F (SHEATH) ×3 IMPLANT
TRAY PACEMAKER INSERTION (PACKS) ×3 IMPLANT

## 2016-12-20 NOTE — Interval H&P Note (Signed)
History and Physical Interval Note:  12/20/2016 3:13 PM  Anthony Adams  has presented today for surgery, with the diagnosis of ischemic cardiomyopathy, LV dysfunction  The various methods of treatment have been discussed with the patient and family. After consideration of risks, benefits and other options for treatment, the patient has consented to  Procedure(s): ICD Implant (N/A) as a surgical intervention .  The patient's history has been reviewed, patient examined, no change in status, stable for surgery.  I have reviewed the patient's chart and labs.  Questions were answered to the patient's satisfaction.     Lewayne Bunting

## 2016-12-20 NOTE — H&P (View-Only) (Signed)
HPI Anthony Adams returns today for ongoing evaluation and management of his chronic systolic heart failure, ischemic cardiomyopathy, status post ICD implantation, who then developed an early device infection, and was explanted. In the interim, he has been stable. He denies any ICD shock. He is wearing a life vest. Heart failure symptoms are well compensated. He denies fevers or chills. No Known Allergies   Current Outpatient Prescriptions  Medication Sig Dispense Refill  . acetaminophen (TYLENOL) 500 MG tablet Take 1,000 mg by mouth 2 (two) times daily as needed for headache.    . carvedilol (COREG) 12.5 MG tablet Take 1 tablet (12.5 mg total) by mouth 2 (two) times daily. 180 tablet 3  . gabapentin (NEURONTIN) 600 MG tablet Take 600 mg by mouth 4 (four) times daily.  2  . HYDROcodone-acetaminophen (NORCO) 10-325 MG tablet Take 1 tablet by mouth every 6 (six) hours as needed for moderate pain.     [name] losartan (COZAAR) 50 MG tablet Take 1 tablet (50 mg total) by mouth daily. 90 tablet 3  . metFORMIN (GLUCOPHAGE-XR) 500 MG 24 hr tablet Take 500 mg by mouth 2 (two) times daily.     . pravastatin (PRAVACHOL) 40 MG tablet Take 40 mg by mouth daily.     . QC LO-DOSE ASPIRIN 81 MG EC tablet Take 81 mg by mouth daily.  12  . ranitidine (ZANTAC) 150 MG tablet Take 150 mg by mouth 2 (two) times daily.     . tamsulosin (FLOMAX) 0.4 MG CAPS capsule Take 0.4 mg by mouth daily after breakfast.      No current facility-administered medications for this visit.      Past Medical History:  Diagnosis Date  . AICD (automatic cardioverter/defibrillator) present 08/02/2016  . Arthritis    " In my hands & feet "  . Back pain    Recent injection with aspirin and Plavix held, patient had chest pain that day  . Blood glucose elevated    March, 2012  . CAD (coronary artery disease)     Chest pain while off Plavix, March, 2012, catheterization, LAD stents patent, worsening third diagonal 80%-use PCI only  for recurrent significant symptoms /  ..NSTEMI..8101BPZ DES LAD (kissing balloon)  . CAD (coronary artery disease)    plavix indefinitly  . CAD (coronary artery disease)    cardiac clearance .[name].for back surgery...october 2011  . Chronic systolic heart failure (HCC)   . Diabetes mellitus without complication (HCC)   . Dyslipidemia   . Gout    April, 201 to  . Hypertension    EF 50-55%, borderline anterior hypokinesis, catheterization March, 2012  . SOB (shortness of breath)     ROS:   All systems reviewed and negative except as noted in the HPI.   Past Surgical History:  Procedure Laterality Date  . BACK SURGERY    . ICD GENERATOR REMOVAL N/A 10/12/2016   Procedure: ICD Generator Removal;  Surgeon: Marinus Maw, MD;  [facility]: [facility] INVASIVE CV LAB;  Service: Cardiovascular;  Laterality: N/A;  . ICD IMPLANT N/A 08/02/2016   Procedure: ICD Implant;  Surgeon: Marinus Maw, MD;  Location: [facility] INVASIVE CV LAB;  Service: Cardiovascular;  Laterality: N/A;  . ICD LEAD REMOVAL  10/12/2016   Procedure: Icd Lead Removal;  Surgeon: Marinus Maw, MD;  Location: [facility] INVASIVE CV LAB;  Service: Cardiovascular;;  . INSERTION OF ICD  08/02/2016     Family History  Problem Relation Age of Onset  .  Coronary artery disease Father      Social History   Social History  . Marital status: Single    Spouse name: N/A  . Number of children: N/A  . Years of education: N/A   Occupational History  . Airline pilot   Social History Main Topics  . Smoking status: Former Smoker    Packs/day: 2.00    Years: 15.00    Types: Cigarettes    Quit date: 09/24/1973  . Smokeless tobacco: Current User    Types: Snuff     Comment: "I have been dipping snuff for 25 years or more"  . Alcohol use No  . Drug use: No  . Sexual activity: Not on file   Other Topics Concern  . Not on file   Social History Narrative  . No narrative on file     BP 136/72   Pulse (!) 59   Ht 6\' 1"   (1.854 m)   Wt 235 lb (106.6 kg)   BMI 31.00 kg/m   Physical Exam:  Well appearing 66 year old man, NAD HEENT: Unremarkable Neck:  No JVD, no thyromegally Lungs:  Clear, incision is well-healed on the left. Neuro:  CN II through XII intact, motor grossly intact   Assess/Plan: 1. Chronic systolic heart failure - his symptoms appear to be well compensated 2. Ischemic cardio myopathy - he denies anginal symptoms. 3. Prior ICD system infection status post removal - his incision is healed nicely. We discussed the pros and cons of undergoing device implantation on the contralateral side, versus insertion of a subcutaneous device, and he would like to proceed with contralateral device insertion.  66, M.D.

## 2016-12-20 NOTE — Discharge Summary (Signed)
ELECTROPHYSIOLOGY PROCEDURE DISCHARGE SUMMARY    Patient ID: Anthony Adams,  MRN: [id number], DOB/AGE: 1950/09/22 66 y.o.  Admit date: 12/20/2016 Discharge date: 11/20/16  Primary Care Physician: Ignatius Specking, MD  Primary Cardiologist: Dr. Eden Emms Electrophysiologist: Dr. Ladona Ridgel  Primary Discharge Diagnosis:  1. ICM  Secondary Discharge Diagnosis:  1. CAD 2. DM 3. HTN 4. Chronic systolic CHF  No Known Allergies   Procedures This Admission:  1.  Implantation of a Biotronik single chamber ICD on 11/19/16 by Dr Ladona Ridgel.  The patient received a (serial # [id number] ) right ventricular defibrillator lead, Biotronik (serial Number [id number]) ICD DFT's were deferred at time of implant.   There were no immediate post procedure complications. 2.  CXR on 11/20/16 demonstrated no pneumothorax status post device implantation.   Brief HPI: Anthony Adams is a 66 y.o. male is followed out patient was s/p ICD extraction secondary to infection and planned for re-implantation after treatment and healing.  Past medical history is as above.   Risks, benefits, and alternatives to ICD implantation were reviewed with the patient who wished to proceed.   Hospital Course:  The patient was admitted and underwent implantation of an ICD with details as outlined above. He was monitored on telemetry overnight which demonstrated SR/Vpacing, occ pVCs .  Right chest was without hematoma or ecchymosis.  The device was interrogated and found to be functioning normally.  CXR was obtained and demonstrated no pneumothorax status post device implantation.  Wound care, arm mobility, and restrictions were reviewed with the patient.  The patient was examined by Dr. Ladona Ridgel and considered stable for discharge to home.   The patient's discharge medications include an ARB (losartan) and beta blocker (carvedilol).   Physical Exam: Vitals:   12/21/16 0200 12/21/16 0358 12/21/16 0610 12/21/16 0706  BP: 117/67 127/69 125/80     Pulse: 60 (!) 53 (!) 56 64  Resp: 16 14 13    Temp: 98.2 F (36.8 C)  98.3 F (36.8 C) (!) 97.5 F (36.4 C)  TempSrc: Oral  Oral Oral  SpO2: 97% 98% 97% 94%  Weight:   230 lb 2.6 oz (104.4 kg)   Height:        GEN- The patient is well appearing, alert and oriented x 3 today.   HEENT: normocephalic, atraumatic; sclera clear, conjunctiva pink; hearing intact; oropharynx clear Lungs- CTA b/l, normal work of breathing.  No wheezes, rales, rhonchi Heart- RRR, no murmurs, rubs or gallops, PMI not laterally displaced GI- soft, non-tender, non-distended, bowel sounds present Extremities- no clubbing, cyanosis, or edema MS- no significant deformity or atrophy Skin- warm and dry, no rash or lesion, right chest without hematoma/ecchymosis Psych- euthymic mood, full affect Neuro- no gross defecits  Labs:   Lab Results  Component Value Date   WBC 7.3 12/14/2016   HGB 14.0 12/14/2016   HCT 41.6 12/14/2016   MCV 84 12/14/2016   PLT 155 12/14/2016     Recent Labs Lab 12/14/16 1305  NA 141  K 4.2  CL 102  CO2 22  BUN 19  CREATININE 1.40*  CALCIUM 9.7  GLUCOSE 114*    Discharge Medications:  Allergies as of 12/21/2016   No Known Allergies     Medication List    TAKE these medications   acetaminophen 500 MG tablet Commonly known as:  TYLENOL Take 1,000 mg by mouth 2 (two) times daily as needed for headache.   carvedilol 12.5 MG tablet Commonly known as:  COREG  Take 1 tablet (12.5 mg total) by mouth 2 (two) times daily.   gabapentin 600 MG tablet Commonly known as:  NEURONTIN Take 600 mg by mouth 4 (four) times daily.   HYDROcodone-acetaminophen 10-325 MG tablet Commonly known as:  NORCO Take 1 tablet by mouth every 6 (six) hours as needed for moderate pain.   losartan 50 MG tablet Commonly known as:  COZAAR Take 1 tablet (50 mg total) by mouth daily.   metFORMIN 500 MG 24 hr tablet Commonly known as:  GLUCOPHAGE-XR Take 500 mg by mouth 2 (two) times daily.    pravastatin 40 MG tablet Commonly known as:  PRAVACHOL Take 40 mg by mouth daily.   QC LO-DOSE ASPIRIN 81 MG EC tablet Generic drug:  aspirin Take 81 mg by mouth daily.   ranitidine 150 MG tablet Commonly known as:  ZANTAC Take 150 mg by mouth 2 (two) times daily.   tamsulosin 0.4 MG Caps capsule Commonly known as:  FLOMAX Take 0.4 mg by mouth daily after breakfast.            Discharge Care Instructions        Start     Ordered   12/21/16 0000  Increase activity slowly     12/21/16 0919   12/21/16 0000  Diet - low sodium heart healthy     12/21/16 0919      Disposition:  Home Discharge Instructions    Diet - low sodium heart healthy    Complete by:  As directed    Increase activity slowly    Complete by:  As directed      Follow-up Information    [facility] Follow up on 01/03/2017.   Specialty:  Cardiology Why:  3:00PM, wound check visit Contact information: [address] [place] [address] [phone]       Marinus Maw, MD Follow up on 03/21/2017.   Specialty:  Cardiology Why:  9:45AM Contact information: [address] [place] [address] [phone]           Duration of Discharge Encounter: Greater than 30 minutes including physician time.  Norma Fredrickson, PA-C 12/21/2016 9:19 AM  EP Attending  Patient seen and examined. Agree with above. He is doing well after ICD insertion and his device is working normally. He is stable for DC. Usual followup.  Leonia Reeves.D.

## 2016-12-21 ENCOUNTER — Encounter (HOSPITAL_COMMUNITY): Payer: Self-pay | Admitting: Internal Medicine

## 2016-12-21 ENCOUNTER — Ambulatory Visit (HOSPITAL_COMMUNITY): Payer: Medicare HMO

## 2016-12-21 DIAGNOSIS — I255 Ischemic cardiomyopathy: Secondary | ICD-10-CM | POA: Diagnosis not present

## 2016-12-21 DIAGNOSIS — I5022 Chronic systolic (congestive) heart failure: Secondary | ICD-10-CM | POA: Diagnosis not present

## 2016-12-21 DIAGNOSIS — Z23 Encounter for immunization: Secondary | ICD-10-CM | POA: Diagnosis not present

## 2016-12-21 DIAGNOSIS — I110 Hypertensive heart disease with heart failure: Secondary | ICD-10-CM | POA: Diagnosis not present

## 2016-12-21 LAB — GLUCOSE, CAPILLARY
Glucose-Capillary: 101 mg/dL — ABNORMAL HIGH (ref 65–99)
Glucose-Capillary: 134 mg/dL — ABNORMAL HIGH (ref 65–99)

## 2016-12-21 MED ORDER — OFF THE BEAT BOOK
Freq: Once | Status: AC
  Administered 2016-12-21: 02:00:00
  Filled 2016-12-21: qty 1

## 2016-12-21 MED ORDER — YOU HAVE A PACEMAKER BOOK
Freq: Once | Status: AC
  Administered 2016-12-21: 02:00:00
  Filled 2016-12-21: qty 1

## 2016-12-21 NOTE — Discharge Instructions (Signed)
° ° °  Supplemental Discharge Instructions for  Pacemaker/Defibrillator Patients  Activity No heavy lifting or vigorous activity with your left/right arm for 6 to 8 weeks.  Do not raise your left/right arm above your head for one week.  Gradually raise your affected arm as drawn below.             11/23/16                     11/25/16                      11/26/16                      11/27/16 __  NO DRIVING for  1 week   ; you may begin driving on   05/31/32  .  WOUND CARE - Keep the wound area clean and dry.  Do not get this area we, no showers until cleared to at your wound check visit. - The tape/steri-strips on your wound will fall off; do not pull them off.  No bandage is needed on the site.  DO  NOT apply any creams, oils, or ointments to the wound area. - If you notice any drainage or discharge from the wound, any swelling or bruising at the site, or you develop a fever > 101? F after you are discharged home, call the office at once.  Special Instructions - You are still able to use cellular telephones; use the ear opposite the side where you have your pacemaker/defibrillator.  Avoid carrying your cellular phone near your device. - When traveling through airports, show security personnel your identification card to avoid being screened in the metal detectors.  Ask the security personnel to use the hand wand. - Avoid arc welding equipment, MRI testing (magnetic resonance imaging), TENS units (transcutaneous nerve stimulators).  Call the office for questions about other devices. - Avoid electrical appliances that are in poor condition or are not properly grounded. - Microwave ovens are safe to be near or to operate.  Additional information for defibrillator patients should your device go off: - If your device goes off ONCE and you feel fine afterward, notify the device clinic nurses. - If your device goes off ONCE and you do not feel well afterward, call 911. - If your device goes off TWICE,  call 911. - If your device goes off THREE times in one day, call 911.  DO NOT DRIVE YOURSELF OR A FAMILY MEMBER WITH A DEFIBRILLATOR TO THE HOSPITAL--CALL 911.

## 2016-12-21 NOTE — Care Management Note (Signed)
Case Management Note  Patient Details  Name: Anthony Adams MRN: [id number] Date of Birth: 1950-09-08  Subjective/Objective:   From home, s/p ICD implant.                   Action/Plan: NCM will follow for dc needs.   Expected Discharge Date:  12/21/16               Expected Discharge Plan:  Home/Self Care  In-House Referral:     Discharge planning Services  CM Consult  Post Acute Care Choice:    Choice offered to:     DME Arranged:    DME Agency:     HH Arranged:    HH Agency:     Status of Service:  Completed, signed off  If discussed at [employer] of Stay Meetings, dates discussed:    Additional Comments:  Leone Haven, RN 12/21/2016, 9:22 AM

## 2017-01-02 ENCOUNTER — Telehealth: Payer: Self-pay | Admitting: Internal Medicine

## 2017-01-02 NOTE — Telephone Encounter (Signed)
New message    Kendal Hymen from [employer] [facility] is calling about authorization for retro life vest for pt on 12/13/16.

## 2017-01-02 NOTE — Telephone Encounter (Signed)
**Note De-Identified Anthony Adams Obfuscation** I do not handle life vest only medications. Thanks.

## 2017-01-03 ENCOUNTER — Ambulatory Visit (INDEPENDENT_AMBULATORY_CARE_PROVIDER_SITE_OTHER): Payer: Medicare HMO | Admitting: *Deleted

## 2017-01-03 DIAGNOSIS — I5022 Chronic systolic (congestive) heart failure: Secondary | ICD-10-CM

## 2017-01-03 LAB — CUP PACEART INCLINIC DEVICE CHECK
Date Time Interrogation Session: 20181010154007
HighPow Impedance: 71 Ohm
Implantable Lead Serial Number: 49983487
Lead Channel Sensing Intrinsic Amplitude: 24.2 mV
Lead Channel Sensing Intrinsic Amplitude: 5.6 mV
Lead Channel Setting Pacing Amplitude: 3 V
MDC IDC LEAD IMPLANT DT: 20180926
MDC IDC LEAD LOCATION: 753860
MDC IDC MSMT BATTERY VOLTAGE: 3.12 V
MDC IDC MSMT LEADCHNL RV IMPEDANCE VALUE: 912 Ohm
MDC IDC MSMT LEADCHNL RV PACING THRESHOLD AMPLITUDE: 0.5 V
MDC IDC MSMT LEADCHNL RV PACING THRESHOLD PULSEWIDTH: 0.4 ms
MDC IDC PG IMPLANT DT: 20180926
MDC IDC SET LEADCHNL RV PACING PULSEWIDTH: 0.4 ms
MDC IDC SET LEADCHNL RV SENSING SENSITIVITY: 0.8 mV
MDC IDC STAT BRADY RV PERCENT PACED: 0 %
Pulse Gen Serial Number: 60969357

## 2017-01-03 NOTE — Progress Notes (Signed)
Wound check appointment. Steri-strips removed. Wound without redness or edema. Incision edges approximated, wound well healed. Normal device function. Threshold, sensing, and impedances consistent with implant measurements. Device programmed at 3.5V for extra safety margin until 3 month visit. Histogram distribution appropriate for patient and level of activity. No ventricular arrhythmias noted. Patient educated about wound care, arm mobility, lifting restrictions, shock plan. ROV 03/21/2017 w/ GT

## 2017-03-02 ENCOUNTER — Encounter: Payer: Self-pay | Admitting: Internal Medicine

## 2017-03-21 ENCOUNTER — Encounter: Payer: Self-pay | Admitting: Internal Medicine

## 2017-03-21 ENCOUNTER — Encounter (INDEPENDENT_AMBULATORY_CARE_PROVIDER_SITE_OTHER): Payer: Self-pay

## 2017-03-21 ENCOUNTER — Ambulatory Visit: Payer: Medicare HMO | Admitting: Internal Medicine

## 2017-03-21 VITALS — BP 154/94 | HR 55 | Ht 73.0 in | Wt 243.4 lb

## 2017-03-21 DIAGNOSIS — Z95810 Presence of automatic (implantable) cardiac defibrillator: Secondary | ICD-10-CM

## 2017-03-21 DIAGNOSIS — I5022 Chronic systolic (congestive) heart failure: Secondary | ICD-10-CM | POA: Diagnosis not present

## 2017-03-21 NOTE — Progress Notes (Signed)
HPI Anthony Adams returns today for ICD followup. He is a pleasant 66 yo man with chronic systolic heart failure, ICM, s/p ICD who developed a device infection, underwent removal of his device and insertion of a new device on the contralateral side. In the interim, he has done well with no chest pain or sob. No syncope. He walks regularly. He notes that at home is blood pressure is fairly well controlled.  No Known Allergies   Current Outpatient Medications  Medication Sig Dispense Refill  . acetaminophen (TYLENOL) 500 MG tablet Take 1,000 mg by mouth 2 (two) times daily as needed for headache.    . carvedilol (COREG) 12.5 MG tablet Take 1 tablet (12.5 mg total) by mouth 2 (two) times daily. 180 tablet 3  . gabapentin (NEURONTIN) 600 MG tablet Take 600 mg by mouth 4 (four) times daily.  2  . HYDROcodone-acetaminophen (NORCO) 10-325 MG tablet Take 1 tablet by mouth every 6 (six) hours as needed for moderate pain.     [name] losartan (COZAAR) 50 MG tablet Take 1 tablet (50 mg total) by mouth daily. 90 tablet 3  . metFORMIN (GLUCOPHAGE-XR) 500 MG 24 hr tablet Take 500 mg by mouth 2 (two) times daily.     [name] NARCAN 4 MG/0.1ML LIQD nasal spray kit as needed.    . pravastatin (PRAVACHOL) 40 MG tablet Take 40 mg by mouth daily.     . QC LO-DOSE ASPIRIN 81 MG EC tablet Take 81 mg by mouth daily.  12  . ranitidine (ZANTAC) 150 MG tablet Take 150 mg by mouth 2 (two) times daily.     . tamsulosin (FLOMAX) 0.4 MG CAPS capsule Take 0.4 mg by mouth daily after breakfast.      No current facility-administered medications for this visit.      Past Medical History:  Diagnosis Date  . AICD (automatic cardioverter/defibrillator) present 08/02/2016  . Arthritis    " In my hands , back, feet " (12/20/2016)  . Back pain    Recent injection with aspirin and Plavix held, patient had chest pain that day  . Blood glucose elevated    March, 2012  . CAD (coronary artery disease)     Chest pain while off Plavix,  March, 2012, catheterization, LAD stents patent, worsening third diagonal 80%-use PCI only for recurrent significant symptoms /  ..NSTEMI..5015AEW DES LAD (kissing balloon)  . CAD (coronary artery disease)    plavix indefinitly  . CAD (coronary artery disease)    cardiac clearance .[name].for back surgery...october 2011  . Chronic systolic heart failure ([place])   . Dyslipidemia   . Gout    April, 201 to  . Hypertension    EF 50-55%, borderline anterior hypokinesis, catheterization March, 2012  . SOB (shortness of breath)   . Type II diabetes mellitus ([facility])     ROS:   All systems reviewed and negative except as noted in the HPI.   Past Surgical History:  Procedure Laterality Date  . BACK SURGERY    . CARDIAC CATHETERIZATION  08/2005; 05/2010   [name] 08/09/2010; notes 05/29/2010  . CARDIAC DEFIBRILLATOR PLACEMENT  12/20/2016  . CORONARY ANGIOPLASTY WITH STENT PLACEMENT  08/2005   [name] 08/09/2010  . CORONARY ARTERY BYPASS GRAFT  05/2015   CABG X5  . ICD GENERATOR REMOVAL N/A 10/12/2016   Procedure: ICD Generator Removal;  Surgeon: Evans Lance, MD;  Location: [facility];  Service: Cardiovascular;  Laterality: N/A;  . ICD IMPLANT  N/A 08/02/2016   Procedure: ICD Implant;  Surgeon: Evans Lance, MD;  Location: [place] [facility];  Service: Cardiovascular;  Laterality: N/A;  . ICD IMPLANT N/A 12/20/2016   Procedure: ICD Implant;  Surgeon: Evans Lance, MD;  Location: [place] [facility];  Service: Cardiovascular;  Laterality: N/A;  . ICD LEAD REMOVAL  10/12/2016   Procedure: Icd Lead Removal;  Surgeon: Evans Lance, MD;  Location: [place] [facility];  Service: Cardiovascular;;  . LUMBAR DISC SURGERY  X 3     Family History  Problem Relation Age of Onset  . Coronary artery disease Father      Social History   Socioeconomic History  . Marital status: Divorced    Spouse name: Not on file  . Number of children: Not on file  . Years of education: Not on file  . Highest  education level: Not on file  Social Needs  . Financial resource strain: Not on file  . Food insecurity - worry: Not on file  . Food insecurity - inability: Not on file  . Transportation needs - medical: Not on file  . Transportation needs - non-medical: Not on file  Occupational History  . Occupation: Systems developer: [employer]  Tobacco Use  . Smoking status: Former Smoker    Packs/day: 2.00    Years: 15.00    Pack years: 30.00    Types: Cigarettes    Last attempt to quit: 09/24/1973    Years since quitting: 43.5  . Smokeless tobacco: Current User    Types: Snuff  . Tobacco comment: 12/20/2016 "I have been dipping snuff since I was ~  66 years old"  Substance and Sexual Activity  . Alcohol use: No  . Drug use: No  . Sexual activity: No  Other Topics Concern  . Not on file  Social History Narrative  . Not on file     BP (!) 154/94   Pulse (!) 55   Ht _0  (1.854 m)   Wt 243 lb 6.4 oz (110.4 kg)   SpO2 98%   BMI 32.11 kg/m   Physical Exam:  Well appearing 66 yo man, NAD HEENT: Unremarkable Neck:  6 cm JVD, no thyromegally Lymphatics:  No adenopathy Back:  No CVA tenderness Lungs:  Clear with no wheezes HEART:  Regular rate rhythm, no murmurs, no rubs, no clicks Abd:  soft, positive bowel sounds, no organomegally, no rebound, no guarding Ext:  2 plus pulses, no edema, no cyanosis, no clubbing Skin:  No rashes no nodules Neuro:  CN II through XII intact, motor grossly intact  EKG - sinus brady with an inferior MI  DEVICE  Normal device function.  See PaceArt for details.   Assess/Plan: 1. ICM - he denies anginal symptoms and remains active. No change in meds. 2. HTN - his blood pressure is elevated today but not at home. His HR is low so we cannot uptitrate his beta blocker.  3. ICD - his Biotronik VDD device is working normally. Will recheck in several months.  4. Chronic systolic heart failure - his symptoms are class 2. He will continue  his current meds. He is encouraged to walk and maintain a low sodium diet.  Mikle Bosworth.D.

## 2017-03-21 NOTE — Patient Instructions (Signed)
Medication Instructions:  Your physician recommends that you continue on your current medications as directed. Please refer to the Current Medication list given to you today.  Labwork: None ordered.  Testing/Procedures: None ordered.  Follow-Up: Your physician wants you to follow-up in: 9 months with Dr. Taylor.   You will receive a reminder letter in the mail two months in advance. If you don't receive a letter, please call our office to schedule the follow-up appointment.  Remote monitoring is used to monitor your Pacemaker from home. This monitoring reduces the number of office visits required to check your device to one time per year. It allows us to keep an eye on the functioning of your device to ensure it is working properly. You are scheduled for a device check from home on 06/20/2017. You may send your transmission at any time that day. If you have a wireless device, the transmission will be sent automatically. After your physician reviews your transmission, you will receive a postcard with your next transmission date.    Any Other Special Instructions Will Be Listed Below (If Applicable).     If you need a refill on your cardiac medications before your next appointment, please call your pharmacy.   

## 2017-04-03 ENCOUNTER — Other Ambulatory Visit: Payer: Self-pay | Admitting: Internal Medicine

## 2017-04-13 ENCOUNTER — Telehealth: Payer: Self-pay | Admitting: Internal Medicine

## 2017-04-13 NOTE — Telephone Encounter (Signed)
Patient's sister Anthony Adams [facility]) was calling about patient's BP being elevated at his appointment with his neurologist.  BP 158/38, Anthony Adams did not have a HR at that time.  Informed Anthony Adams that per Dr. Lubertha Basque last office note, that patient's BP is elevated that day but not elevated at home. Informed her that patient's HR is low so Dr. Ladona Ridgel did not want to up titrate his beta blocker. Encouraged patient's sister to get some BP and HR readings at home over the weekend and give our office a call with the results. Patient's sister verbalized understanding and will call with BP readings if elevated. Will forward to Dr. Ladona Ridgel and his nurse for further advisement.

## 2017-04-13 NOTE — Telephone Encounter (Signed)
New message   Patient was informed to contact taylor if his bp top number was still high and its 150/38 Please call

## 2017-04-17 LAB — CUP PACEART INCLINIC DEVICE CHECK
Battery Voltage: 3.12 V
Brady Statistic RV Percent Paced: 0 %
Date Time Interrogation Session: 20181226151600
HIGH POWER IMPEDANCE MEASURED VALUE: 78 Ohm
Implantable Lead Implant Date: 20180926
Implantable Lead Location: 753860
Implantable Lead Model: 414005
Implantable Pulse Generator Implant Date: 20180926
Lead Channel Sensing Intrinsic Amplitude: 13.1 mV
Lead Channel Setting Pacing Amplitude: 2.5 V
Lead Channel Setting Pacing Pulse Width: 0.4 ms
Lead Channel Setting Sensing Sensitivity: 0.8 mV
MDC IDC LEAD SERIAL: 49983487
MDC IDC MSMT LEADCHNL RV IMPEDANCE VALUE: 927 Ohm
MDC IDC MSMT LEADCHNL RV PACING THRESHOLD AMPLITUDE: 0.4 V
MDC IDC MSMT LEADCHNL RV PACING THRESHOLD PULSEWIDTH: 0.4 ms
MDC IDC MSMT LEADCHNL RV SENSING INTR AMPL: 24.2 mV
MDC IDC PG SERIAL: 60969357

## 2017-04-17 NOTE — Telephone Encounter (Signed)
Returned call to Pt Anthony Adams.  Per contact, Pt blood pressure has been lower over the weekend.  No concerns at this time.  Notified to call office if any further needs.

## 2017-06-20 ENCOUNTER — Ambulatory Visit (INDEPENDENT_AMBULATORY_CARE_PROVIDER_SITE_OTHER): Payer: Medicare HMO | Admitting: *Deleted

## 2017-06-20 DIAGNOSIS — I5022 Chronic systolic (congestive) heart failure: Secondary | ICD-10-CM

## 2017-06-20 DIAGNOSIS — I255 Ischemic cardiomyopathy: Secondary | ICD-10-CM

## 2017-06-20 NOTE — Progress Notes (Signed)
Remote ICD transmission.   

## 2017-06-25 ENCOUNTER — Encounter: Payer: Self-pay | Admitting: Cardiology

## 2017-07-01 LAB — CUP PACEART REMOTE DEVICE CHECK
Implantable Lead Serial Number: 49983487
Implantable Pulse Generator Implant Date: 20180926
MDC IDC LEAD IMPLANT DT: 20180926
MDC IDC LEAD LOCATION: 753860
MDC IDC PG SERIAL: 60969357
MDC IDC SESS DTM: 20190407204356
Pulse Gen Model: 404633

## 2017-09-03 DIAGNOSIS — M5127 Other intervertebral disc displacement, lumbosacral region: Secondary | ICD-10-CM | POA: Diagnosis not present

## 2017-09-03 DIAGNOSIS — M545 Low back pain: Secondary | ICD-10-CM | POA: Diagnosis not present

## 2017-09-03 DIAGNOSIS — G894 Chronic pain syndrome: Secondary | ICD-10-CM | POA: Diagnosis not present

## 2017-09-19 ENCOUNTER — Ambulatory Visit (INDEPENDENT_AMBULATORY_CARE_PROVIDER_SITE_OTHER): Payer: Medicare HMO | Admitting: *Deleted

## 2017-09-19 DIAGNOSIS — I255 Ischemic cardiomyopathy: Secondary | ICD-10-CM | POA: Diagnosis not present

## 2017-09-19 LAB — CUP PACEART REMOTE DEVICE CHECK
Brady Statistic AS VS Percent: 99 %
Brady Statistic RV Percent Paced: 0 %
HighPow Impedance: 83 Ohm
Implantable Lead Implant Date: 20180926
Implantable Lead Location: 753860
Implantable Lead Serial Number: 49983487
Implantable Pulse Generator Implant Date: 20180926
Lead Channel Impedance Value: 976 Ohm
Lead Channel Pacing Threshold Amplitude: 0.4 V
Lead Channel Pacing Threshold Pulse Width: 0.4 ms
Lead Channel Setting Sensing Sensitivity: 0.8 mV
MDC IDC MSMT BATTERY REMAINING PERCENTAGE: 100 %
MDC IDC MSMT BATTERY VOLTAGE: 3.12 V
MDC IDC SESS DTM: 20190626053341
MDC IDC SET LEADCHNL RV PACING AMPLITUDE: 2.5 V
MDC IDC SET LEADCHNL RV PACING PULSEWIDTH: 0.4 ms
MDC IDC STAT BRADY AS VP PERCENT: 0 %
Pulse Gen Model: 404633
Pulse Gen Serial Number: 60969357

## 2017-09-19 NOTE — Progress Notes (Signed)
Remote ICD transmission.   

## 2017-09-20 ENCOUNTER — Encounter: Payer: Self-pay | Admitting: Cardiology

## 2017-10-01 DIAGNOSIS — G894 Chronic pain syndrome: Secondary | ICD-10-CM | POA: Diagnosis not present

## 2017-10-01 DIAGNOSIS — M5127 Other intervertebral disc displacement, lumbosacral region: Secondary | ICD-10-CM | POA: Diagnosis not present

## 2017-10-01 DIAGNOSIS — M545 Low back pain: Secondary | ICD-10-CM | POA: Diagnosis not present

## 2017-10-02 DIAGNOSIS — Z299 Encounter for prophylactic measures, unspecified: Secondary | ICD-10-CM | POA: Diagnosis not present

## 2017-10-02 DIAGNOSIS — M47816 Spondylosis without myelopathy or radiculopathy, lumbar region: Secondary | ICD-10-CM | POA: Diagnosis not present

## 2017-10-02 DIAGNOSIS — M25552 Pain in left hip: Secondary | ICD-10-CM | POA: Diagnosis not present

## 2017-10-02 DIAGNOSIS — Z6832 Body mass index (BMI) 32.0-32.9, adult: Secondary | ICD-10-CM | POA: Diagnosis not present

## 2017-10-02 DIAGNOSIS — I10 Essential (primary) hypertension: Secondary | ICD-10-CM | POA: Diagnosis not present

## 2017-10-02 DIAGNOSIS — E1165 Type 2 diabetes mellitus with hyperglycemia: Secondary | ICD-10-CM | POA: Diagnosis not present

## 2017-10-12 DIAGNOSIS — M25552 Pain in left hip: Secondary | ICD-10-CM | POA: Diagnosis not present

## 2017-10-12 DIAGNOSIS — I10 Essential (primary) hypertension: Secondary | ICD-10-CM | POA: Diagnosis not present

## 2017-10-12 DIAGNOSIS — Z299 Encounter for prophylactic measures, unspecified: Secondary | ICD-10-CM | POA: Diagnosis not present

## 2017-10-12 DIAGNOSIS — Z6832 Body mass index (BMI) 32.0-32.9, adult: Secondary | ICD-10-CM | POA: Diagnosis not present

## 2017-10-24 DIAGNOSIS — Z6833 Body mass index (BMI) 33.0-33.9, adult: Secondary | ICD-10-CM | POA: Diagnosis not present

## 2017-10-24 DIAGNOSIS — L03039 Cellulitis of unspecified toe: Secondary | ICD-10-CM | POA: Diagnosis not present

## 2017-10-24 DIAGNOSIS — E1165 Type 2 diabetes mellitus with hyperglycemia: Secondary | ICD-10-CM | POA: Diagnosis not present

## 2017-10-24 DIAGNOSIS — I10 Essential (primary) hypertension: Secondary | ICD-10-CM | POA: Diagnosis not present

## 2017-10-24 DIAGNOSIS — Z299 Encounter for prophylactic measures, unspecified: Secondary | ICD-10-CM | POA: Diagnosis not present

## 2017-10-29 DIAGNOSIS — E119 Type 2 diabetes mellitus without complications: Secondary | ICD-10-CM | POA: Diagnosis not present

## 2017-10-29 DIAGNOSIS — I2510 Atherosclerotic heart disease of native coronary artery without angina pectoris: Secondary | ICD-10-CM | POA: Diagnosis not present

## 2017-10-29 DIAGNOSIS — E7800 Pure hypercholesterolemia, unspecified: Secondary | ICD-10-CM | POA: Diagnosis not present

## 2017-10-31 DIAGNOSIS — M545 Low back pain: Secondary | ICD-10-CM | POA: Diagnosis not present

## 2017-10-31 DIAGNOSIS — G894 Chronic pain syndrome: Secondary | ICD-10-CM | POA: Diagnosis not present

## 2017-10-31 DIAGNOSIS — M5127 Other intervertebral disc displacement, lumbosacral region: Secondary | ICD-10-CM | POA: Diagnosis not present

## 2017-11-07 DIAGNOSIS — E1165 Type 2 diabetes mellitus with hyperglycemia: Secondary | ICD-10-CM | POA: Diagnosis not present

## 2017-11-07 DIAGNOSIS — Z6832 Body mass index (BMI) 32.0-32.9, adult: Secondary | ICD-10-CM | POA: Diagnosis not present

## 2017-11-07 DIAGNOSIS — L84 Corns and callosities: Secondary | ICD-10-CM | POA: Diagnosis not present

## 2017-11-07 DIAGNOSIS — L03039 Cellulitis of unspecified toe: Secondary | ICD-10-CM | POA: Diagnosis not present

## 2017-11-07 DIAGNOSIS — I10 Essential (primary) hypertension: Secondary | ICD-10-CM | POA: Diagnosis not present

## 2017-11-07 DIAGNOSIS — Z299 Encounter for prophylactic measures, unspecified: Secondary | ICD-10-CM | POA: Diagnosis not present

## 2017-11-30 DIAGNOSIS — I2510 Atherosclerotic heart disease of native coronary artery without angina pectoris: Secondary | ICD-10-CM | POA: Diagnosis not present

## 2017-11-30 DIAGNOSIS — E7800 Pure hypercholesterolemia, unspecified: Secondary | ICD-10-CM | POA: Diagnosis not present

## 2017-11-30 DIAGNOSIS — E119 Type 2 diabetes mellitus without complications: Secondary | ICD-10-CM | POA: Diagnosis not present

## 2017-12-04 DIAGNOSIS — G894 Chronic pain syndrome: Secondary | ICD-10-CM | POA: Diagnosis not present

## 2017-12-04 DIAGNOSIS — M5127 Other intervertebral disc displacement, lumbosacral region: Secondary | ICD-10-CM | POA: Diagnosis not present

## 2017-12-04 DIAGNOSIS — M545 Low back pain: Secondary | ICD-10-CM | POA: Diagnosis not present

## 2017-12-19 ENCOUNTER — Ambulatory Visit (INDEPENDENT_AMBULATORY_CARE_PROVIDER_SITE_OTHER): Payer: Medicare HMO | Admitting: *Deleted

## 2017-12-19 DIAGNOSIS — I255 Ischemic cardiomyopathy: Secondary | ICD-10-CM

## 2017-12-19 NOTE — Progress Notes (Signed)
Remote ICD transmission.   

## 2017-12-20 ENCOUNTER — Telehealth: Payer: Self-pay | Admitting: Cardiology

## 2017-12-20 ENCOUNTER — Encounter: Payer: Self-pay | Admitting: Cardiology

## 2017-12-20 NOTE — Telephone Encounter (Signed)
Opened in error

## 2017-12-25 ENCOUNTER — Encounter: Payer: Self-pay | Admitting: Internal Medicine

## 2017-12-25 ENCOUNTER — Ambulatory Visit (INDEPENDENT_AMBULATORY_CARE_PROVIDER_SITE_OTHER): Payer: Medicare HMO | Admitting: Internal Medicine

## 2017-12-25 VITALS — BP 122/60 | HR 66 | Ht 73.0 in | Wt 246.0 lb

## 2017-12-25 DIAGNOSIS — I5022 Chronic systolic (congestive) heart failure: Secondary | ICD-10-CM

## 2017-12-25 DIAGNOSIS — Z95810 Presence of automatic (implantable) cardiac defibrillator: Secondary | ICD-10-CM | POA: Diagnosis not present

## 2017-12-25 DIAGNOSIS — I10 Essential (primary) hypertension: Secondary | ICD-10-CM

## 2017-12-25 LAB — CUP PACEART INCLINIC DEVICE CHECK
Date Time Interrogation Session: 20191001160446
HighPow Impedance: 93 Ohm
Implantable Lead Implant Date: 20180926
Implantable Lead Location: 753860
Implantable Lead Model: 414005
Implantable Lead Serial Number: 49983487
Implantable Pulse Generator Implant Date: 20180926
Lead Channel Impedance Value: 970 Ohm
Lead Channel Pacing Threshold Amplitude: 0.4 V
Lead Channel Pacing Threshold Pulse Width: 0.4 ms
Lead Channel Sensing Intrinsic Amplitude: 24.2 mV
MDC IDC MSMT BATTERY VOLTAGE: 3.11 V
MDC IDC MSMT LEADCHNL RA SENSING INTR AMPL: 6.1 mV
MDC IDC SET LEADCHNL RV PACING AMPLITUDE: 2.5 V
MDC IDC SET LEADCHNL RV PACING PULSEWIDTH: 0.4 ms
MDC IDC SET LEADCHNL RV SENSING SENSITIVITY: 0.8 mV
MDC IDC STAT BRADY RV PERCENT PACED: 0 %
Pulse Gen Model: 404633
Pulse Gen Serial Number: 60969357

## 2017-12-25 NOTE — Progress Notes (Signed)
HPI Mr. Ellenwood returns today for ICD followup. He is a pleasant 67 yo man with chronic systolic heart failure, ICM, s/p ICD who has done well since his new device was placed. In the interim, he has done well with no chest pain or sob. No syncope. He walks regularly. He notes that at home is blood pressure is fairly well controlled.  No Known Allergies   Current Outpatient Medications  Medication Sig Dispense Refill  . acetaminophen (TYLENOL) 500 MG tablet Take 1,000 mg by mouth 2 (two) times daily as needed for headache.    . carvedilol (COREG) 12.5 MG tablet TAKE 1 TABLET BY MOUTH TWICE DAILY 180 tablet 3  . gabapentin (NEURONTIN) 600 MG tablet Take 600 mg by mouth 4 (four) times daily.  2  . HYDROcodone-acetaminophen (NORCO) 10-325 MG tablet Take 1 tablet by mouth every 6 (six) hours as needed for moderate pain.     [name] losartan (COZAAR) 50 MG tablet TAKE 1 TABLET BY MOUTH EVERY DAY 90 tablet 3  . metFORMIN (GLUCOPHAGE-XR) 500 MG 24 hr tablet Take 500 mg by mouth 2 (two) times daily.     [name] NARCAN 4 MG/0.1ML LIQD nasal spray kit as needed.    . QC LO-DOSE ASPIRIN 81 MG EC tablet Take 81 mg by mouth daily.  12  . ranitidine (ZANTAC) 150 MG tablet Take 150 mg by mouth 2 (two) times daily.     . tamsulosin (FLOMAX) 0.4 MG CAPS capsule Take 0.4 mg by mouth daily after breakfast.     . pravastatin (PRAVACHOL) 40 MG tablet Take 40 mg by mouth daily.      No current facility-administered medications for this visit.      Past Medical History:  Diagnosis Date  . AICD (automatic cardioverter/defibrillator) present 08/02/2016  . Arthritis    " In my hands , back, feet " (12/20/2016)  . Back pain    Recent injection with aspirin and Plavix held, patient had chest pain that day  . Blood glucose elevated    March, 2012  . CAD (coronary artery disease)     Chest pain while off Plavix, March, 2012, catheterization, LAD stents patent, worsening third diagonal 80%-use PCI only for recurrent  significant symptoms /  ..NSTEMI..2620BTD DES LAD (kissing balloon)  . CAD (coronary artery disease)    plavix indefinitly  . CAD (coronary artery disease)    cardiac clearance .[name].for back surgery...october 2011  . Chronic systolic heart failure ([place])   . Dyslipidemia   . Gout    April, 201 to  . Hypertension    EF 50-55%, borderline anterior hypokinesis, catheterization March, 2012  . SOB (shortness of breath)   . Type II diabetes mellitus (H[facility])     ROS:   All systems reviewed and negative except as noted in the HPI.   Past Surgical History:  Procedure Laterality Date  . BACK SURGERY    . CARDIAC CATHETERIZATION  08/2005; 05/2010   [name] 08/09/2010; notes 05/29/2010  . CARDIAC DEFIBRILLATOR PLACEMENT  12/20/2016  . CORONARY ANGIOPLASTY WITH STENT PLACEMENT  08/2005   [name] 08/09/2010  . CORONARY ARTERY BYPASS GRAFT  05/2015   CABG X5  . ICD GENERATOR REMOVAL N/A 10/12/2016   Procedure: ICD Generator Removal;  Surgeon: Evans Lance, MD;  Location: [facility];  Service: Cardiovascular;  Laterality: N/A;  . ICD IMPLANT N/A 08/02/2016   Procedure: ICD Implant;  Surgeon: Evans Lance, MD;  Location: [facility];  Service: Cardiovascular;  Laterality: N/A;  . ICD IMPLANT N/A 12/20/2016   Procedure: ICD Implant;  Surgeon: Evans Lance, MD;  Location: [facility];  Service: Cardiovascular;  Laterality: N/A;  . ICD LEAD REMOVAL  10/12/2016   Procedure: Icd Lead Removal;  Surgeon: Evans Lance, MD;  Location: [facility];  Service: Cardiovascular;;  . LUMBAR DISC SURGERY  X 3     Family History  Problem Relation Age of Onset  . Coronary artery disease Father      Social History   Socioeconomic History  . Marital status: Divorced    Spouse name: Not on file  . Number of children: Not on file  . Years of education: Not on file  . Highest education level: Not on file  Occupational History  . Occupation: Systems developer:  [employer]  Social Needs  . Financial resource strain: Not on file  . Food insecurity:    Worry: Not on file    Inability: Not on file  . Transportation needs:    Medical: Not on file    Non-medical: Not on file  Tobacco Use  . Smoking status: Former Smoker    Packs/day: 2.00    Years: 15.00    Pack years: 30.00    Types: Cigarettes    Last attempt to quit: 09/24/1973    Years since quitting: 44.2  . Smokeless tobacco: Current User    Types: Snuff  . Tobacco comment: 12/20/2016 "I have been dipping snuff since I was ~  67 years old"  Substance and Sexual Activity  . Alcohol use: No  . Drug use: No  . Sexual activity: Never  Lifestyle  . Physical activity:    Days per week: Not on file    Minutes per session: Not on file  . Stress: Not on file  Relationships  . Social connections:    Talks on phone: Not on file    Gets together: Not on file    Attends religious service: Not on file    Active member of club or organization: Not on file    Attends meetings of clubs or organizations: Not on file    Relationship status: Not on file  . Intimate partner violence:    Fear of current or ex partner: Not on file    Emotionally abused: Not on file    Physically abused: Not on file    Forced sexual activity: Not on file  Other Topics Concern  . Not on file  Social History Narrative  . Not on file     BP 122/60   Pulse 66   Ht _0  (1.854 m)   Wt 246 lb (111.6 kg)   BMI 32.46 kg/m   Physical Exam:  Well appearing NAD HEENT: Unremarkable Neck:  No JVD, no thyromegally Lymphatics:  No adenopathy Back:  No CVA tenderness Lungs:  Clear with well healed ICD incision HEART:  Regular rate rhythm, no murmurs, no rubs, no clicks Abd:  soft, positive bowel sounds, no organomegally, no rebound, no guarding Ext:  2 plus pulses, no edema, no cyanosis, no clubbing Skin:  No rashes no nodules Neuro:  CN II through XII intact, motor grossly intact  EKG - nsr  DEVICE    Normal device function.  See PaceArt for details.   Assess/Plan: 1. ICD - his Biotronik device is working normally.  2. Chronic systolic heart failure - his symptoms are  class 2. He is walking regularly.  Mikle Bosworth.D.

## 2017-12-25 NOTE — Patient Instructions (Signed)
Medication Instructions:  Your physician recommends that you continue on your current medications as directed. Please refer to the Current Medication list given to you today.  Labwork: None ordered.  Testing/Procedures: None ordered.  Follow-Up: Your physician wants you to follow-up in: one year with Dr. Ladona Ridgel.   You will receive a reminder letter in the mail two months in advance. If you don't receive a letter, please call our office to schedule the follow-up appointment.  Remote monitoring is used to monitor your ICD from home. This monitoring reduces the number of office visits required to check your device to one time per year. It allows [place] to keep an eye on the functioning of your device to ensure it is working properly. You are scheduled for a device check from home on 03/21/2018. You may send your transmission at any time that day. If you have a wireless device, the transmission will be sent automatically. After your physician reviews your transmission, you will receive a postcard with your next transmission date.  Any Other Special Instructions Will Be Listed Below (If Applicable).  If you need a refill on your cardiac medications before your next appointment, please call your pharmacy.

## 2018-01-08 DIAGNOSIS — M545 Low back pain: Secondary | ICD-10-CM | POA: Diagnosis not present

## 2018-01-08 DIAGNOSIS — M5126 Other intervertebral disc displacement, lumbar region: Secondary | ICD-10-CM | POA: Diagnosis not present

## 2018-01-08 DIAGNOSIS — G894 Chronic pain syndrome: Secondary | ICD-10-CM | POA: Diagnosis not present

## 2018-01-09 DIAGNOSIS — Z23 Encounter for immunization: Secondary | ICD-10-CM | POA: Diagnosis not present

## 2018-01-09 DIAGNOSIS — Z299 Encounter for prophylactic measures, unspecified: Secondary | ICD-10-CM | POA: Diagnosis not present

## 2018-01-09 DIAGNOSIS — I10 Essential (primary) hypertension: Secondary | ICD-10-CM | POA: Diagnosis not present

## 2018-01-09 DIAGNOSIS — Z6832 Body mass index (BMI) 32.0-32.9, adult: Secondary | ICD-10-CM | POA: Diagnosis not present

## 2018-01-09 DIAGNOSIS — E1165 Type 2 diabetes mellitus with hyperglycemia: Secondary | ICD-10-CM | POA: Diagnosis not present

## 2018-01-18 LAB — CUP PACEART REMOTE DEVICE CHECK
Date Time Interrogation Session: 20191025112847
Implantable Lead Location: 753860
Implantable Lead Serial Number: 49983487
MDC IDC LEAD IMPLANT DT: 20180926
MDC IDC PG IMPLANT DT: 20180926
MDC IDC PG SERIAL: 60969357
Pulse Gen Model: 404633

## 2018-02-05 DIAGNOSIS — Z79891 Long term (current) use of opiate analgesic: Secondary | ICD-10-CM | POA: Diagnosis not present

## 2018-02-05 DIAGNOSIS — Z6829 Body mass index (BMI) 29.0-29.9, adult: Secondary | ICD-10-CM | POA: Diagnosis not present

## 2018-02-05 DIAGNOSIS — G894 Chronic pain syndrome: Secondary | ICD-10-CM | POA: Diagnosis not present

## 2018-02-05 DIAGNOSIS — Z79899 Other long term (current) drug therapy: Secondary | ICD-10-CM | POA: Diagnosis not present

## 2018-02-05 DIAGNOSIS — M5126 Other intervertebral disc displacement, lumbar region: Secondary | ICD-10-CM | POA: Diagnosis not present

## 2018-02-05 DIAGNOSIS — M545 Low back pain: Secondary | ICD-10-CM | POA: Diagnosis not present

## 2018-03-06 DIAGNOSIS — G894 Chronic pain syndrome: Secondary | ICD-10-CM | POA: Diagnosis not present

## 2018-03-06 DIAGNOSIS — M545 Low back pain: Secondary | ICD-10-CM | POA: Diagnosis not present

## 2018-03-06 DIAGNOSIS — M5126 Other intervertebral disc displacement, lumbar region: Secondary | ICD-10-CM | POA: Diagnosis not present

## 2018-03-21 ENCOUNTER — Ambulatory Visit (INDEPENDENT_AMBULATORY_CARE_PROVIDER_SITE_OTHER): Payer: Medicare HMO

## 2018-03-21 DIAGNOSIS — I255 Ischemic cardiomyopathy: Secondary | ICD-10-CM

## 2018-03-21 DIAGNOSIS — I5022 Chronic systolic (congestive) heart failure: Secondary | ICD-10-CM | POA: Diagnosis not present

## 2018-03-21 NOTE — Progress Notes (Signed)
Remote ICD transmission.   

## 2018-03-24 LAB — CUP PACEART REMOTE DEVICE CHECK
Date Time Interrogation Session: 20191229194316
MDC IDC LEAD IMPLANT DT: 20180926
MDC IDC LEAD LOCATION: 753860
MDC IDC LEAD SERIAL: 49983487
MDC IDC PG IMPLANT DT: 20180926
Pulse Gen Model: 404633
Pulse Gen Serial Number: 60969357

## 2018-04-03 DIAGNOSIS — G894 Chronic pain syndrome: Secondary | ICD-10-CM | POA: Diagnosis not present

## 2018-04-03 DIAGNOSIS — M545 Low back pain: Secondary | ICD-10-CM | POA: Diagnosis not present

## 2018-04-03 DIAGNOSIS — M5126 Other intervertebral disc displacement, lumbar region: Secondary | ICD-10-CM | POA: Diagnosis not present

## 2018-04-14 ENCOUNTER — Other Ambulatory Visit: Payer: Self-pay | Admitting: Internal Medicine

## 2018-04-17 DIAGNOSIS — Z6832 Body mass index (BMI) 32.0-32.9, adult: Secondary | ICD-10-CM | POA: Diagnosis not present

## 2018-04-17 DIAGNOSIS — I2510 Atherosclerotic heart disease of native coronary artery without angina pectoris: Secondary | ICD-10-CM | POA: Diagnosis not present

## 2018-04-17 DIAGNOSIS — E7800 Pure hypercholesterolemia, unspecified: Secondary | ICD-10-CM | POA: Diagnosis not present

## 2018-04-17 DIAGNOSIS — I10 Essential (primary) hypertension: Secondary | ICD-10-CM | POA: Diagnosis not present

## 2018-04-17 DIAGNOSIS — Z299 Encounter for prophylactic measures, unspecified: Secondary | ICD-10-CM | POA: Diagnosis not present

## 2018-04-17 DIAGNOSIS — E1165 Type 2 diabetes mellitus with hyperglycemia: Secondary | ICD-10-CM | POA: Diagnosis not present

## 2018-05-08 DIAGNOSIS — G894 Chronic pain syndrome: Secondary | ICD-10-CM | POA: Diagnosis not present

## 2018-05-08 DIAGNOSIS — M47897 Other spondylosis, lumbosacral region: Secondary | ICD-10-CM | POA: Diagnosis not present

## 2018-05-08 DIAGNOSIS — M545 Low back pain: Secondary | ICD-10-CM | POA: Diagnosis not present

## 2018-06-06 DIAGNOSIS — M545 Low back pain: Secondary | ICD-10-CM | POA: Diagnosis not present

## 2018-06-06 DIAGNOSIS — G894 Chronic pain syndrome: Secondary | ICD-10-CM | POA: Diagnosis not present

## 2018-06-20 ENCOUNTER — Ambulatory Visit (INDEPENDENT_AMBULATORY_CARE_PROVIDER_SITE_OTHER): Payer: Medicare HMO | Admitting: *Deleted

## 2018-06-20 ENCOUNTER — Other Ambulatory Visit: Payer: Self-pay

## 2018-06-20 DIAGNOSIS — I255 Ischemic cardiomyopathy: Secondary | ICD-10-CM

## 2018-06-20 LAB — CUP PACEART REMOTE DEVICE CHECK
Date Time Interrogation Session: 20200326155842
Implantable Pulse Generator Implant Date: 20180926
MDC IDC LEAD IMPLANT DT: 20180926
MDC IDC LEAD LOCATION: 753860
MDC IDC LEAD SERIAL: 49983487
Pulse Gen Model: 404633
Pulse Gen Serial Number: 60969357

## 2018-06-26 ENCOUNTER — Encounter: Payer: Self-pay | Admitting: Cardiology

## 2018-06-26 NOTE — Progress Notes (Signed)
Remote ICD transmission.   

## 2018-07-04 DIAGNOSIS — E875 Hyperkalemia: Secondary | ICD-10-CM | POA: Diagnosis not present

## 2018-07-04 DIAGNOSIS — J069 Acute upper respiratory infection, unspecified: Secondary | ICD-10-CM | POA: Diagnosis not present

## 2018-07-04 DIAGNOSIS — R0989 Other specified symptoms and signs involving the circulatory and respiratory systems: Secondary | ICD-10-CM | POA: Diagnosis not present

## 2018-07-04 DIAGNOSIS — R7989 Other specified abnormal findings of blood chemistry: Secondary | ICD-10-CM | POA: Diagnosis not present

## 2018-07-04 DIAGNOSIS — I10 Essential (primary) hypertension: Secondary | ICD-10-CM | POA: Diagnosis not present

## 2018-07-04 DIAGNOSIS — N179 Acute kidney failure, unspecified: Secondary | ICD-10-CM | POA: Diagnosis not present

## 2018-07-04 DIAGNOSIS — Z951 Presence of aortocoronary bypass graft: Secondary | ICD-10-CM | POA: Diagnosis not present

## 2018-07-04 DIAGNOSIS — B9789 Other viral agents as the cause of diseases classified elsewhere: Secondary | ICD-10-CM | POA: Diagnosis not present

## 2018-07-04 DIAGNOSIS — R079 Chest pain, unspecified: Secondary | ICD-10-CM | POA: Diagnosis not present

## 2018-07-04 DIAGNOSIS — E785 Hyperlipidemia, unspecified: Secondary | ICD-10-CM | POA: Diagnosis not present

## 2018-07-10 DIAGNOSIS — G894 Chronic pain syndrome: Secondary | ICD-10-CM | POA: Diagnosis not present

## 2018-07-10 DIAGNOSIS — M545 Low back pain: Secondary | ICD-10-CM | POA: Diagnosis not present

## 2018-07-24 DIAGNOSIS — I2510 Atherosclerotic heart disease of native coronary artery without angina pectoris: Secondary | ICD-10-CM | POA: Diagnosis not present

## 2018-07-24 DIAGNOSIS — Z299 Encounter for prophylactic measures, unspecified: Secondary | ICD-10-CM | POA: Diagnosis not present

## 2018-07-24 DIAGNOSIS — I10 Essential (primary) hypertension: Secondary | ICD-10-CM | POA: Diagnosis not present

## 2018-07-24 DIAGNOSIS — E1165 Type 2 diabetes mellitus with hyperglycemia: Secondary | ICD-10-CM | POA: Diagnosis not present

## 2018-07-24 DIAGNOSIS — Z6832 Body mass index (BMI) 32.0-32.9, adult: Secondary | ICD-10-CM | POA: Diagnosis not present

## 2018-08-07 DIAGNOSIS — G894 Chronic pain syndrome: Secondary | ICD-10-CM | POA: Diagnosis not present

## 2018-08-07 DIAGNOSIS — M545 Low back pain: Secondary | ICD-10-CM | POA: Diagnosis not present

## 2018-08-07 DIAGNOSIS — Z79899 Other long term (current) drug therapy: Secondary | ICD-10-CM | POA: Diagnosis not present

## 2018-08-07 DIAGNOSIS — Z79891 Long term (current) use of opiate analgesic: Secondary | ICD-10-CM | POA: Diagnosis not present

## 2018-08-30 DIAGNOSIS — Z125 Encounter for screening for malignant neoplasm of prostate: Secondary | ICD-10-CM | POA: Diagnosis not present

## 2018-08-30 DIAGNOSIS — Z1211 Encounter for screening for malignant neoplasm of colon: Secondary | ICD-10-CM | POA: Diagnosis not present

## 2018-08-30 DIAGNOSIS — I10 Essential (primary) hypertension: Secondary | ICD-10-CM | POA: Diagnosis not present

## 2018-08-30 DIAGNOSIS — Z1339 Encounter for screening examination for other mental health and behavioral disorders: Secondary | ICD-10-CM | POA: Diagnosis not present

## 2018-08-30 DIAGNOSIS — Z79899 Other long term (current) drug therapy: Secondary | ICD-10-CM | POA: Diagnosis not present

## 2018-08-30 DIAGNOSIS — E1165 Type 2 diabetes mellitus with hyperglycemia: Secondary | ICD-10-CM | POA: Diagnosis not present

## 2018-08-30 DIAGNOSIS — E7800 Pure hypercholesterolemia, unspecified: Secondary | ICD-10-CM | POA: Diagnosis not present

## 2018-08-30 DIAGNOSIS — R5383 Other fatigue: Secondary | ICD-10-CM | POA: Diagnosis not present

## 2018-08-30 DIAGNOSIS — Z1331 Encounter for screening for depression: Secondary | ICD-10-CM | POA: Diagnosis not present

## 2018-08-30 DIAGNOSIS — Z7189 Other specified counseling: Secondary | ICD-10-CM | POA: Diagnosis not present

## 2018-08-30 DIAGNOSIS — Z6832 Body mass index (BMI) 32.0-32.9, adult: Secondary | ICD-10-CM | POA: Diagnosis not present

## 2018-08-30 DIAGNOSIS — Z0000 Encounter for general adult medical examination without abnormal findings: Secondary | ICD-10-CM | POA: Diagnosis not present

## 2018-09-04 DIAGNOSIS — M545 Low back pain: Secondary | ICD-10-CM | POA: Diagnosis not present

## 2018-09-04 DIAGNOSIS — G894 Chronic pain syndrome: Secondary | ICD-10-CM | POA: Diagnosis not present

## 2018-09-04 DIAGNOSIS — Z6829 Body mass index (BMI) 29.0-29.9, adult: Secondary | ICD-10-CM | POA: Diagnosis not present

## 2018-09-13 DIAGNOSIS — I10 Essential (primary) hypertension: Secondary | ICD-10-CM | POA: Diagnosis not present

## 2018-09-19 ENCOUNTER — Ambulatory Visit (INDEPENDENT_AMBULATORY_CARE_PROVIDER_SITE_OTHER): Payer: Medicare HMO | Admitting: *Deleted

## 2018-09-19 DIAGNOSIS — I255 Ischemic cardiomyopathy: Secondary | ICD-10-CM | POA: Diagnosis not present

## 2018-09-19 LAB — CUP PACEART REMOTE DEVICE CHECK
Date Time Interrogation Session: 20200625101357
Implantable Lead Implant Date: 20180926
Implantable Lead Location: 753860
Implantable Lead Model: 414005
Implantable Lead Serial Number: 49983487
Implantable Pulse Generator Implant Date: 20180926
Pulse Gen Model: 404633
Pulse Gen Serial Number: 60969357

## 2018-09-29 ENCOUNTER — Encounter: Payer: Self-pay | Admitting: Cardiology

## 2018-09-29 NOTE — Progress Notes (Signed)
Remote ICD transmission.   

## 2018-10-02 DIAGNOSIS — M545 Low back pain: Secondary | ICD-10-CM | POA: Diagnosis not present

## 2018-10-02 DIAGNOSIS — G894 Chronic pain syndrome: Secondary | ICD-10-CM | POA: Diagnosis not present

## 2018-10-24 DIAGNOSIS — I10 Essential (primary) hypertension: Secondary | ICD-10-CM | POA: Diagnosis not present

## 2018-10-30 DIAGNOSIS — I2510 Atherosclerotic heart disease of native coronary artery without angina pectoris: Secondary | ICD-10-CM | POA: Diagnosis not present

## 2018-10-30 DIAGNOSIS — I10 Essential (primary) hypertension: Secondary | ICD-10-CM | POA: Diagnosis not present

## 2018-10-30 DIAGNOSIS — E1165 Type 2 diabetes mellitus with hyperglycemia: Secondary | ICD-10-CM | POA: Diagnosis not present

## 2018-10-30 DIAGNOSIS — Z299 Encounter for prophylactic measures, unspecified: Secondary | ICD-10-CM | POA: Diagnosis not present

## 2018-10-30 DIAGNOSIS — Z6830 Body mass index (BMI) 30.0-30.9, adult: Secondary | ICD-10-CM | POA: Diagnosis not present

## 2018-10-31 DIAGNOSIS — I10 Essential (primary) hypertension: Secondary | ICD-10-CM | POA: Diagnosis not present

## 2018-10-31 DIAGNOSIS — M545 Low back pain: Secondary | ICD-10-CM | POA: Diagnosis not present

## 2018-10-31 DIAGNOSIS — G894 Chronic pain syndrome: Secondary | ICD-10-CM | POA: Diagnosis not present

## 2018-10-31 DIAGNOSIS — Z6831 Body mass index (BMI) 31.0-31.9, adult: Secondary | ICD-10-CM | POA: Diagnosis not present

## 2018-11-21 DIAGNOSIS — I10 Essential (primary) hypertension: Secondary | ICD-10-CM | POA: Diagnosis not present

## 2018-12-05 DIAGNOSIS — G894 Chronic pain syndrome: Secondary | ICD-10-CM | POA: Diagnosis not present

## 2018-12-05 DIAGNOSIS — M545 Low back pain: Secondary | ICD-10-CM | POA: Diagnosis not present

## 2018-12-10 DIAGNOSIS — I10 Essential (primary) hypertension: Secondary | ICD-10-CM | POA: Diagnosis not present

## 2018-12-19 ENCOUNTER — Ambulatory Visit (INDEPENDENT_AMBULATORY_CARE_PROVIDER_SITE_OTHER): Payer: Medicare HMO | Admitting: *Deleted

## 2018-12-19 DIAGNOSIS — I255 Ischemic cardiomyopathy: Secondary | ICD-10-CM

## 2018-12-19 LAB — CUP PACEART REMOTE DEVICE CHECK
Date Time Interrogation Session: 20200924171610
Implantable Lead Implant Date: 20180926
Implantable Lead Location: 753860
Implantable Lead Model: 414005
Implantable Lead Serial Number: 49983487
Implantable Pulse Generator Implant Date: 20180926
Pulse Gen Model: 404633
Pulse Gen Serial Number: 60969357

## 2018-12-27 ENCOUNTER — Encounter: Payer: Self-pay | Admitting: Cardiology

## 2018-12-27 NOTE — Progress Notes (Signed)
Remote ICD transmission.   

## 2019-01-06 DIAGNOSIS — M545 Low back pain: Secondary | ICD-10-CM | POA: Diagnosis not present

## 2019-01-06 DIAGNOSIS — G894 Chronic pain syndrome: Secondary | ICD-10-CM | POA: Diagnosis not present

## 2019-01-15 DIAGNOSIS — I10 Essential (primary) hypertension: Secondary | ICD-10-CM | POA: Diagnosis not present

## 2019-02-04 DIAGNOSIS — G894 Chronic pain syndrome: Secondary | ICD-10-CM | POA: Diagnosis not present

## 2019-02-04 DIAGNOSIS — Z79891 Long term (current) use of opiate analgesic: Secondary | ICD-10-CM | POA: Diagnosis not present

## 2019-02-04 DIAGNOSIS — Z79899 Other long term (current) drug therapy: Secondary | ICD-10-CM | POA: Diagnosis not present

## 2019-02-04 DIAGNOSIS — M545 Low back pain: Secondary | ICD-10-CM | POA: Diagnosis not present

## 2019-02-11 DIAGNOSIS — Z299 Encounter for prophylactic measures, unspecified: Secondary | ICD-10-CM | POA: Diagnosis not present

## 2019-02-11 DIAGNOSIS — E1165 Type 2 diabetes mellitus with hyperglycemia: Secondary | ICD-10-CM | POA: Diagnosis not present

## 2019-02-11 DIAGNOSIS — I10 Essential (primary) hypertension: Secondary | ICD-10-CM | POA: Diagnosis not present

## 2019-02-11 DIAGNOSIS — Z6832 Body mass index (BMI) 32.0-32.9, adult: Secondary | ICD-10-CM | POA: Diagnosis not present

## 2019-02-11 DIAGNOSIS — I2510 Atherosclerotic heart disease of native coronary artery without angina pectoris: Secondary | ICD-10-CM | POA: Diagnosis not present

## 2019-02-24 DIAGNOSIS — I10 Essential (primary) hypertension: Secondary | ICD-10-CM | POA: Diagnosis not present

## 2019-03-04 DIAGNOSIS — G894 Chronic pain syndrome: Secondary | ICD-10-CM | POA: Diagnosis not present

## 2019-03-04 DIAGNOSIS — M545 Low back pain: Secondary | ICD-10-CM | POA: Diagnosis not present

## 2019-03-07 ENCOUNTER — Other Ambulatory Visit: Payer: Self-pay | Admitting: Internal Medicine

## 2019-03-07 DIAGNOSIS — I10 Essential (primary) hypertension: Secondary | ICD-10-CM | POA: Diagnosis not present

## 2019-03-20 ENCOUNTER — Ambulatory Visit (INDEPENDENT_AMBULATORY_CARE_PROVIDER_SITE_OTHER): Payer: Medicare HMO | Admitting: *Deleted

## 2019-03-20 DIAGNOSIS — I5022 Chronic systolic (congestive) heart failure: Secondary | ICD-10-CM | POA: Diagnosis not present

## 2019-03-21 LAB — CUP PACEART REMOTE DEVICE CHECK
Brady Statistic RV Percent Paced: 0 %
Date Time Interrogation Session: 20201223193211
HighPow Impedance: 86 Ohm
Implantable Lead Implant Date: 20180926
Implantable Lead Location: 753860
Implantable Lead Model: 414005
Implantable Lead Serial Number: 49983487
Implantable Pulse Generator Implant Date: 20180926
Lead Channel Impedance Value: 811 Ohm
Lead Channel Pacing Threshold Amplitude: 0.4 V
Lead Channel Pacing Threshold Pulse Width: 0.4 ms
Lead Channel Sensing Intrinsic Amplitude: 20 mV
Lead Channel Sensing Intrinsic Amplitude: 5.7 mV
Lead Channel Setting Pacing Amplitude: 2.5 V
Lead Channel Setting Pacing Pulse Width: 0.4 ms
Lead Channel Setting Sensing Sensitivity: 0.8 mV
Pulse Gen Model: 404633
Pulse Gen Serial Number: 60969357

## 2019-04-02 DIAGNOSIS — M545 Low back pain: Secondary | ICD-10-CM | POA: Diagnosis not present

## 2019-04-02 DIAGNOSIS — G894 Chronic pain syndrome: Secondary | ICD-10-CM | POA: Diagnosis not present

## 2019-04-22 ENCOUNTER — Encounter: Payer: Self-pay | Admitting: Internal Medicine

## 2019-04-22 ENCOUNTER — Ambulatory Visit: Payer: Medicare HMO | Admitting: Internal Medicine

## 2019-04-22 ENCOUNTER — Other Ambulatory Visit: Payer: Self-pay

## 2019-04-22 VITALS — BP 106/62 | HR 68 | Ht 73.0 in | Wt 238.2 lb

## 2019-04-22 DIAGNOSIS — Z95810 Presence of automatic (implantable) cardiac defibrillator: Secondary | ICD-10-CM | POA: Diagnosis not present

## 2019-04-22 DIAGNOSIS — I5022 Chronic systolic (congestive) heart failure: Secondary | ICD-10-CM | POA: Diagnosis not present

## 2019-04-22 NOTE — Patient Instructions (Signed)
Medication Instructions:  Your physician recommends that you continue on your current medications as directed. Please refer to the Current Medication list given to you today.  Labwork: None ordered.  Testing/Procedures: None ordered.  Follow-Up: Your physician wants you to follow-up in: one year with Dr. Ladona Ridgel.   You will receive a reminder letter in the mail two months in advance. If you don't receive a letter, please call our office to schedule the follow-up appointment.  Remote monitoring is used to monitor your ICD from home. This monitoring reduces the number of office visits required to check your device to one time per year. It allows [place] to keep an eye on the functioning of your device to ensure it is working properly. You are scheduled for a device check from home on 06/19/2019. You may send your transmission at any time that day. If you have a wireless device, the transmission will be sent automatically. After your physician reviews your transmission, you will receive a postcard with your next transmission date.  Any Other Special Instructions Will Be Listed Below (If Applicable).  If you need a refill on your cardiac medications before your next appointment, please call your pharmacy.

## 2019-04-22 NOTE — Progress Notes (Signed)
HPI Mr. Bolds returns today for ICD followup. He is a pleasant 69 yo man with chronic systolic heart failure, ICM, s/p ICD who has done well since his new device was placed. In the interim, he has done well with no chest pain or sob. No syncope. He walks regularly. He notes that at home is blood pressure is fairly well controlled. No Known Allergies   Current Outpatient Medications  Medication Sig Dispense Refill  . acetaminophen (TYLENOL) 500 MG tablet Take 1,000 mg by mouth 2 (two) times daily as needed for headache.    . carvedilol (COREG) 12.5 MG tablet TAKE 1 TABLET BY MOUTH TWICE DAILY 180 tablet 3  . gabapentin (NEURONTIN) 600 MG tablet Take 600 mg by mouth 4 (four) times daily.  2  . HYDROcodone-acetaminophen (NORCO) 10-325 MG tablet Take 1 tablet by mouth every 6 (six) hours as needed for moderate pain.     [name] losartan (COZAAR) 50 MG tablet TAKE 1 TABLET BY MOUTH EVERY DAY 90 tablet 3  . metFORMIN (GLUCOPHAGE-XR) 500 MG 24 hr tablet Take 500 mg by mouth 2 (two) times daily.     [name] NARCAN 4 MG/0.1ML LIQD nasal spray kit as needed.    . QC LO-DOSE ASPIRIN 81 MG EC tablet Take 81 mg by mouth daily.  12  . ranitidine (ZANTAC) 150 MG tablet Take 150 mg by mouth 2 (two) times daily.     . tamsulosin (FLOMAX) 0.4 MG CAPS capsule Take 0.4 mg by mouth daily after breakfast.     . pravastatin (PRAVACHOL) 40 MG tablet Take 40 mg by mouth daily.      No current facility-administered medications for this visit.     Past Medical History:  Diagnosis Date  . AICD (automatic cardioverter/defibrillator) present 08/02/2016  . Arthritis    " In my hands , back, feet " (12/20/2016)  . Back pain    Recent injection with aspirin and Plavix held, patient had chest pain that day  . Blood glucose elevated    March, 2012  . CAD (coronary artery disease)     Chest pain while off Plavix, March, 2012, catheterization, LAD stents patent, worsening third diagonal 80%-use PCI only for recurrent  significant symptoms /  ..NSTEMI..3662HUT DES LAD (kissing balloon)  . CAD (coronary artery disease)    plavix indefinitly  . CAD (coronary artery disease)    cardiac clearance .[name].for back surgery...october 2011  . Chronic systolic heart failure ([place])   . Dyslipidemia   . Gout    April, 201 to  . Hypertension    EF 50-55%, borderline anterior hypokinesis, catheterization March, 2012  . SOB (shortness of breath)   . Type II diabetes mellitus (HCC)     ROS:   All systems reviewed and negative except as noted in the HPI.   Past Surgical History:  Procedure Laterality Date  . BACK SURGERY    . CARDIAC CATHETERIZATION  08/2005; 05/2010   [name] 08/09/2010; notes 05/29/2010  . CARDIAC DEFIBRILLATOR PLACEMENT  12/20/2016  . CORONARY ANGIOPLASTY WITH STENT PLACEMENT  08/2005   [name] 08/09/2010  . CORONARY ARTERY BYPASS GRAFT  05/2015   CABG X5  . ICD GENERATOR REMOVAL N/A 10/12/2016   Procedure: ICD Generator Removal;  Surgeon: Evans Lance, MD;  Location: [facility];  Service: Cardiovascular;  Laterality: N/A;  . ICD IMPLANT N/A 08/02/2016   Procedure: ICD Implant;  Surgeon: Evans Lance, MD;  Location: [facility];  Service: Cardiovascular;  Laterality: N/A;  . ICD IMPLANT N/A 12/20/2016   Procedure: ICD Implant;  Surgeon: Evans Lance, MD;  Location: [facility];  Service: Cardiovascular;  Laterality: N/A;  . ICD LEAD REMOVAL  10/12/2016   Procedure: Icd Lead Removal;  Surgeon: Evans Lance, MD;  Location: [facility];  Service: Cardiovascular;;  . LUMBAR DISC SURGERY  X 3     Family History  Problem Relation Age of Onset  . Coronary artery disease Father      Social History   Socioeconomic History  . Marital status: Divorced    Spouse name: Not on file  . Number of children: Not on file  . Years of education: Not on file  . Highest education level: Not on file  Occupational History  . Occupation: Systems developer:  [employer]  Tobacco Use  . Smoking status: Former Smoker    Packs/day: 2.00    Years: 15.00    Pack years: 30.00    Types: Cigarettes    Quit date: 09/24/1973    Years since quitting: 45.6  . Smokeless tobacco: Current User    Types: Snuff  . Tobacco comment: 12/20/2016 "I have been dipping snuff since I was ~  69 years old"  Substance and Sexual Activity  . Alcohol use: No  . Drug use: No  . Sexual activity: Never  Other Topics Concern  . Not on file  Social History Narrative  . Not on file   Social Determinants of Health   Financial Resource Strain:   . Difficulty of Paying Living Expenses: Not on file  Food Insecurity:   . Worried About Charity fundraiser in the Last Year: Not on file  . Ran Out of Food in the Last Year: Not on file  Transportation Needs:   . Lack of Transportation (Medical): Not on file  . Lack of Transportation (Non-Medical): Not on file  Physical Activity:   . Days of Exercise per Week: Not on file  . Minutes of Exercise per Session: Not on file  Stress:   . Feeling of Stress : Not on file  Social Connections:   . Frequency of Communication with Friends and Family: Not on file  . Frequency of Social Gatherings with Friends and Family: Not on file  . Attends Religious Services: Not on file  . Active Member of Clubs or Organizations: Not on file  . Attends Archivist Meetings: Not on file  . Marital Status: Not on file  Intimate Partner Violence:   . Fear of Current or Ex-Partner: Not on file  . Emotionally Abused: Not on file  . Physically Abused: Not on file  . Sexually Abused: Not on file     BP 106/62   Pulse 68   Ht 6' 1"  (1.854 m)   Wt 238 lb 3.2 oz (108 kg)   SpO2 90%   BMI 31.43 kg/m   Physical Exam:  Well appearing NAD HEENT: Unremarkable Neck:  No JVD, no thyromegally Lymphatics:  No adenopathy Back:  No CVA tenderness Lungs:  Clear with no wheezes HEART:  Regular rate rhythm, no murmurs, no rubs, no  clicks Abd:  soft, positive bowel sounds, no organomegally, no rebound, no guarding Ext:  2 plus pulses, no edema, no cyanosis, no clubbing Skin:  No rashes no nodules Neuro:  CN II through XII intact, motor grossly intact  EKG - nsr with PVC's  DEVICE  Normal device function.  See PaceArt for details.   Assess/Plan: 1. Chronic systolic heart failure - his symptoms are well controlled. He will continue his current meds and maintain a low sodium diet. 2. ICD - his Biotronik VDD ICD is working normally. We will recheck in several months. 3. HTN - his bp is well controlled. I encouraged him to maintain a low sodium diet.  Mikle Bosworth.D.

## 2019-04-27 DIAGNOSIS — I10 Essential (primary) hypertension: Secondary | ICD-10-CM | POA: Diagnosis not present

## 2019-04-30 DIAGNOSIS — G894 Chronic pain syndrome: Secondary | ICD-10-CM | POA: Diagnosis not present

## 2019-04-30 DIAGNOSIS — M545 Low back pain: Secondary | ICD-10-CM | POA: Diagnosis not present

## 2019-05-20 DIAGNOSIS — E1165 Type 2 diabetes mellitus with hyperglycemia: Secondary | ICD-10-CM | POA: Diagnosis not present

## 2019-05-20 DIAGNOSIS — I2510 Atherosclerotic heart disease of native coronary artery without angina pectoris: Secondary | ICD-10-CM | POA: Diagnosis not present

## 2019-05-20 DIAGNOSIS — I10 Essential (primary) hypertension: Secondary | ICD-10-CM | POA: Diagnosis not present

## 2019-05-20 DIAGNOSIS — Z6831 Body mass index (BMI) 31.0-31.9, adult: Secondary | ICD-10-CM | POA: Diagnosis not present

## 2019-05-20 DIAGNOSIS — Z299 Encounter for prophylactic measures, unspecified: Secondary | ICD-10-CM | POA: Diagnosis not present

## 2019-05-25 DIAGNOSIS — I10 Essential (primary) hypertension: Secondary | ICD-10-CM | POA: Diagnosis not present

## 2019-05-27 DIAGNOSIS — M47896 Other spondylosis, lumbar region: Secondary | ICD-10-CM | POA: Diagnosis not present

## 2019-05-27 DIAGNOSIS — G894 Chronic pain syndrome: Secondary | ICD-10-CM | POA: Diagnosis not present

## 2019-05-27 DIAGNOSIS — M545 Low back pain: Secondary | ICD-10-CM | POA: Diagnosis not present

## 2019-06-19 ENCOUNTER — Ambulatory Visit (INDEPENDENT_AMBULATORY_CARE_PROVIDER_SITE_OTHER): Payer: Medicare HMO | Admitting: *Deleted

## 2019-06-19 DIAGNOSIS — I5022 Chronic systolic (congestive) heart failure: Secondary | ICD-10-CM | POA: Diagnosis not present

## 2019-06-19 LAB — CUP PACEART REMOTE DEVICE CHECK
Battery Voltage: 3.12 V
Brady Statistic RV Percent Paced: 0 %
Date Time Interrogation Session: 20210325065549
HighPow Impedance: 88 Ohm
Implantable Lead Implant Date: 20180926
Implantable Lead Location: 753860
Implantable Lead Model: 414005
Implantable Lead Serial Number: 49983487
Implantable Pulse Generator Implant Date: 20180926
Lead Channel Impedance Value: 782 Ohm
Lead Channel Pacing Threshold Amplitude: 0.4 V
Lead Channel Pacing Threshold Pulse Width: 0.4 ms
Lead Channel Sensing Intrinsic Amplitude: 20 mV
Lead Channel Setting Pacing Amplitude: 2.5 V
Lead Channel Setting Pacing Pulse Width: 0.4 ms
Lead Channel Setting Sensing Sensitivity: 0.8 mV
Pulse Gen Model: 404633
Pulse Gen Serial Number: 60969357

## 2019-06-20 NOTE — Progress Notes (Signed)
ICD Remote  

## 2019-06-24 DIAGNOSIS — I10 Essential (primary) hypertension: Secondary | ICD-10-CM | POA: Diagnosis not present

## 2019-06-24 DIAGNOSIS — E1165 Type 2 diabetes mellitus with hyperglycemia: Secondary | ICD-10-CM | POA: Diagnosis not present

## 2019-07-03 DIAGNOSIS — M47896 Other spondylosis, lumbar region: Secondary | ICD-10-CM | POA: Diagnosis not present

## 2019-07-03 DIAGNOSIS — G894 Chronic pain syndrome: Secondary | ICD-10-CM | POA: Diagnosis not present

## 2019-07-03 DIAGNOSIS — M545 Low back pain: Secondary | ICD-10-CM | POA: Diagnosis not present

## 2019-07-03 DIAGNOSIS — Z6831 Body mass index (BMI) 31.0-31.9, adult: Secondary | ICD-10-CM | POA: Diagnosis not present

## 2019-07-25 DIAGNOSIS — E1165 Type 2 diabetes mellitus with hyperglycemia: Secondary | ICD-10-CM | POA: Diagnosis not present

## 2019-07-25 DIAGNOSIS — I10 Essential (primary) hypertension: Secondary | ICD-10-CM | POA: Diagnosis not present

## 2019-08-01 DIAGNOSIS — Z79891 Long term (current) use of opiate analgesic: Secondary | ICD-10-CM | POA: Diagnosis not present

## 2019-08-01 DIAGNOSIS — Z79899 Other long term (current) drug therapy: Secondary | ICD-10-CM | POA: Diagnosis not present

## 2019-08-01 DIAGNOSIS — G894 Chronic pain syndrome: Secondary | ICD-10-CM | POA: Diagnosis not present

## 2019-08-01 DIAGNOSIS — M545 Low back pain: Secondary | ICD-10-CM | POA: Diagnosis not present

## 2019-08-01 DIAGNOSIS — M47896 Other spondylosis, lumbar region: Secondary | ICD-10-CM | POA: Diagnosis not present

## 2019-08-24 DIAGNOSIS — E1165 Type 2 diabetes mellitus with hyperglycemia: Secondary | ICD-10-CM | POA: Diagnosis not present

## 2019-08-24 DIAGNOSIS — I10 Essential (primary) hypertension: Secondary | ICD-10-CM | POA: Diagnosis not present

## 2019-08-25 IMAGING — CR DG CHEST 2V
2 series · 2 of 2 positions shown · non-contrast
Comparison: Chest x-ray of August 03, 2016

CLINICAL DATA: Status post pacemaker placement. The patient porch
chest soreness.

EXAM:
CHEST  2 VIEW

[chest lat]
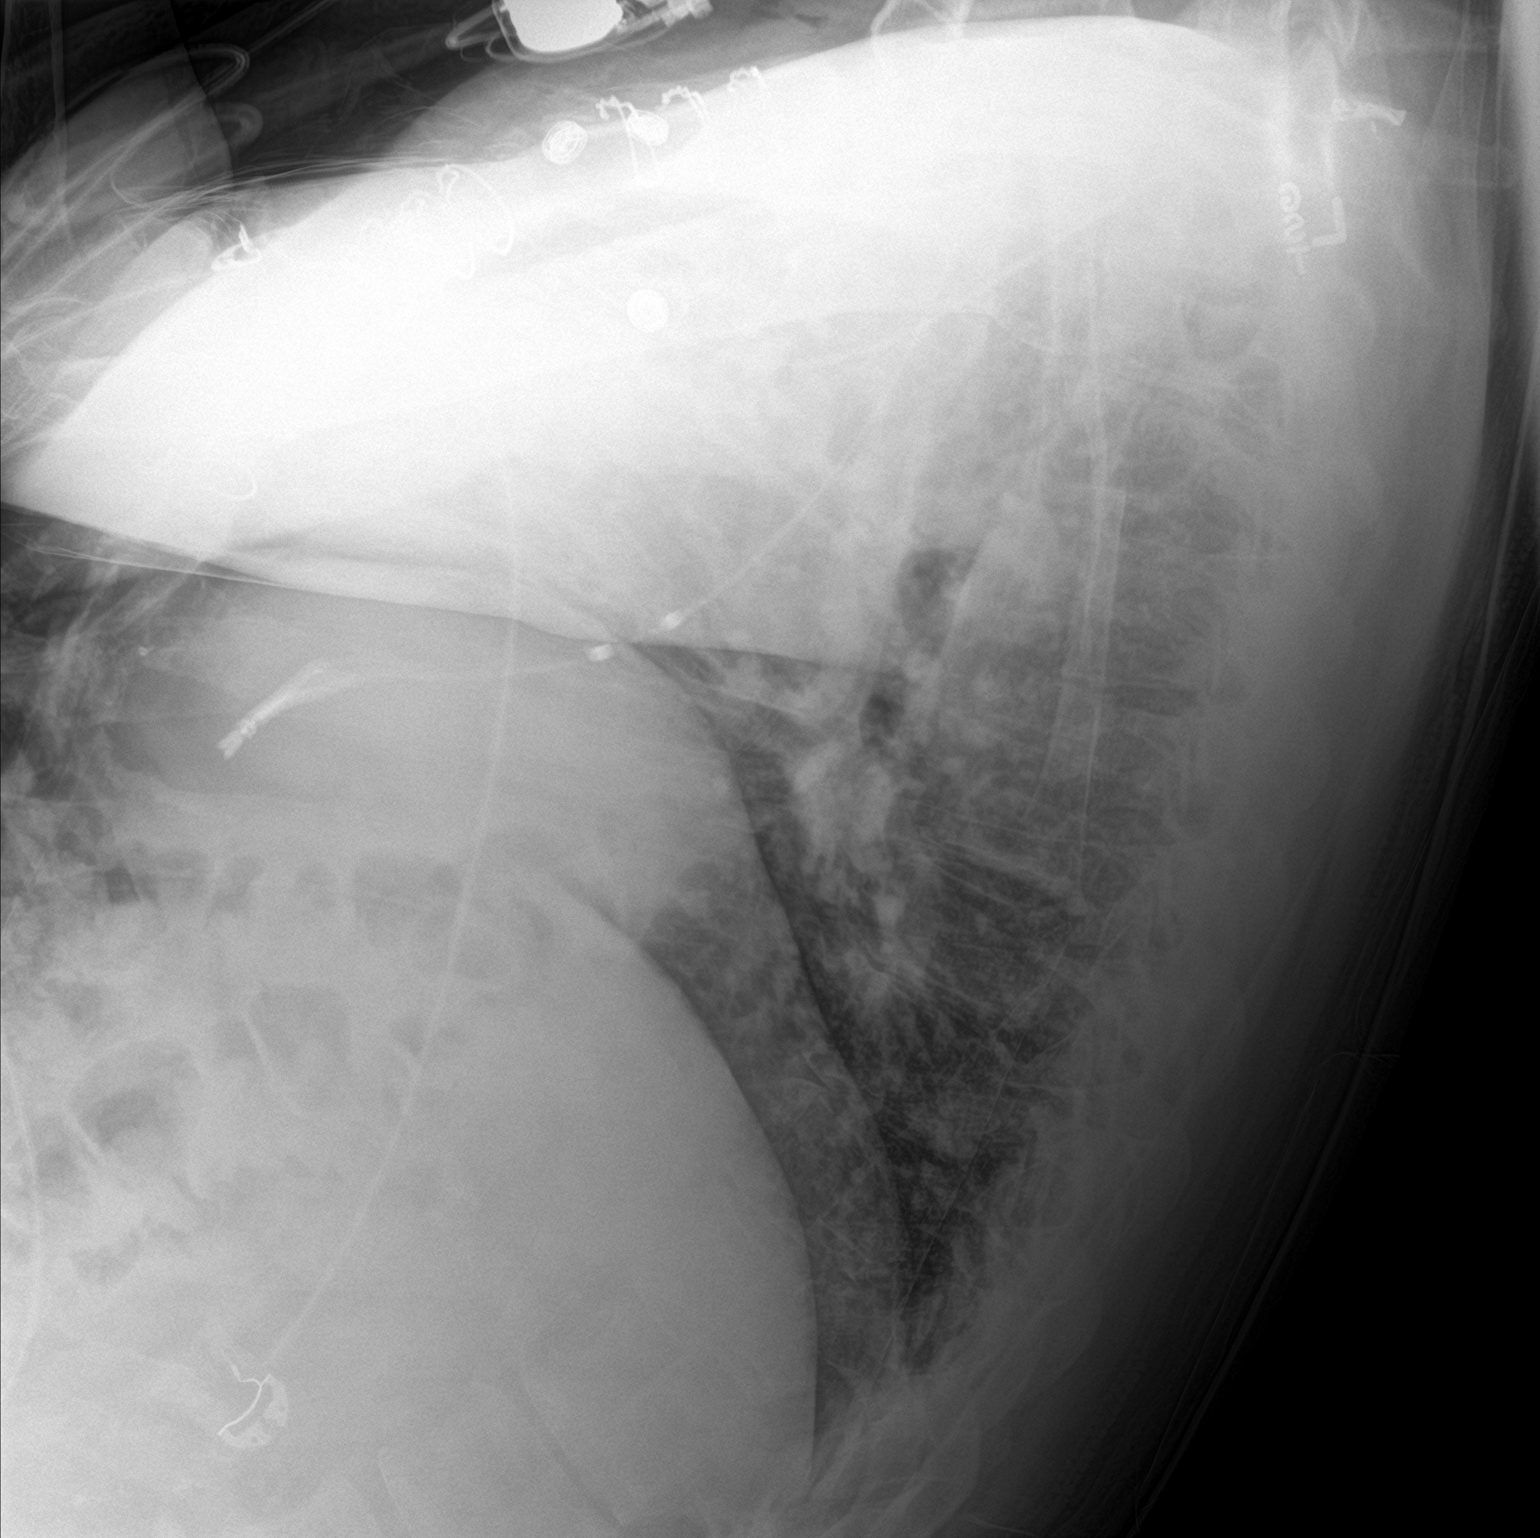

[chest ap]
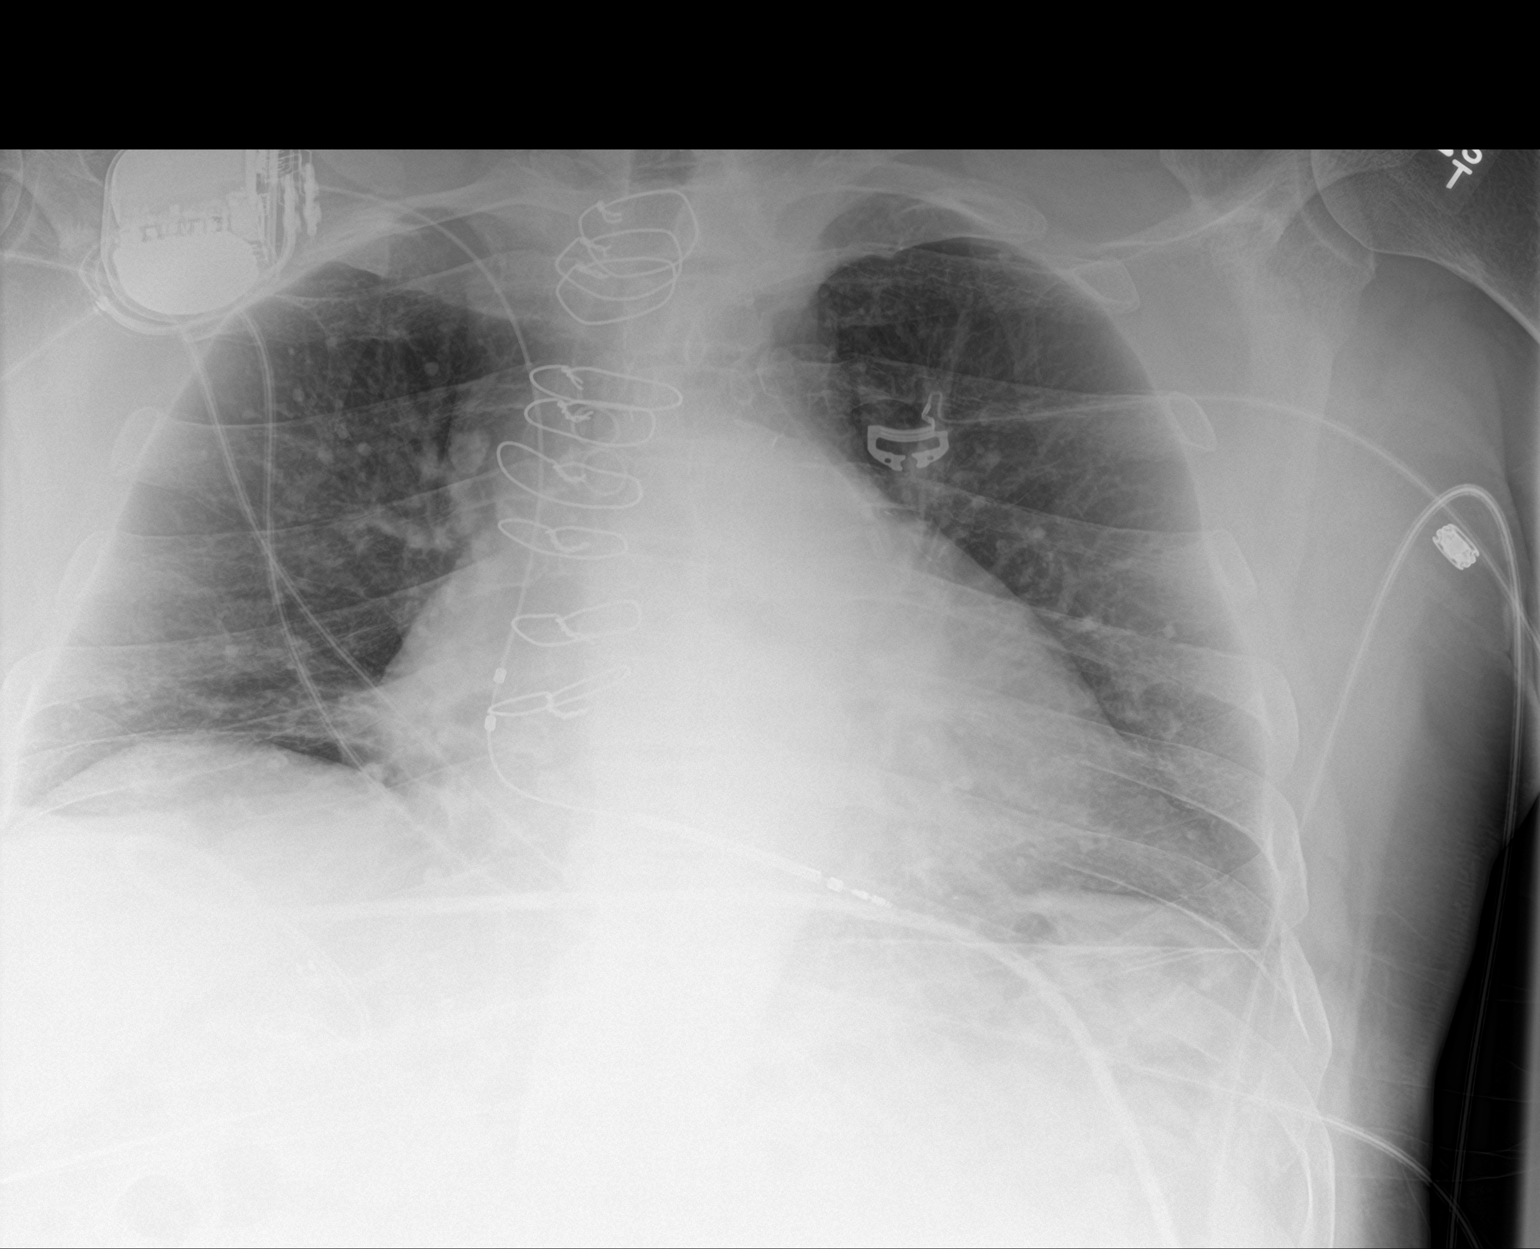

[2 of 2 positions shown; findings below may reference images not displayed]

FINDINGS: Today's study is obtained in a lordotic fashion. The previous
left-sided ICD generator and electrode have been removed and
replaced with a generator on the right. The lungs are reasonably
well inflated with no evidence of a pneumothorax. The cardiac
silhouette remains mildly enlarged. The central pulmonary
vascularity is prominent. The electrode is in reasonable position
radiographically. The sternal wires are intact.
IMPRESSION: No immediate postprocedure complication following ICD placement.
There is probable low-grade compensated CHF.

## 2019-08-26 DIAGNOSIS — I10 Essential (primary) hypertension: Secondary | ICD-10-CM | POA: Diagnosis not present

## 2019-08-26 DIAGNOSIS — M545 Low back pain: Secondary | ICD-10-CM | POA: Diagnosis not present

## 2019-08-26 DIAGNOSIS — G894 Chronic pain syndrome: Secondary | ICD-10-CM | POA: Diagnosis not present

## 2019-08-26 DIAGNOSIS — E7800 Pure hypercholesterolemia, unspecified: Secondary | ICD-10-CM | POA: Diagnosis not present

## 2019-08-26 DIAGNOSIS — M47896 Other spondylosis, lumbar region: Secondary | ICD-10-CM | POA: Diagnosis not present

## 2019-08-26 DIAGNOSIS — Z6832 Body mass index (BMI) 32.0-32.9, adult: Secondary | ICD-10-CM | POA: Diagnosis not present

## 2019-08-26 DIAGNOSIS — E1165 Type 2 diabetes mellitus with hyperglycemia: Secondary | ICD-10-CM | POA: Diagnosis not present

## 2019-08-26 DIAGNOSIS — Z299 Encounter for prophylactic measures, unspecified: Secondary | ICD-10-CM | POA: Diagnosis not present

## 2019-09-03 DIAGNOSIS — Z299 Encounter for prophylactic measures, unspecified: Secondary | ICD-10-CM | POA: Diagnosis not present

## 2019-09-03 DIAGNOSIS — Z1211 Encounter for screening for malignant neoplasm of colon: Secondary | ICD-10-CM | POA: Diagnosis not present

## 2019-09-03 DIAGNOSIS — R5383 Other fatigue: Secondary | ICD-10-CM | POA: Diagnosis not present

## 2019-09-03 DIAGNOSIS — Z6832 Body mass index (BMI) 32.0-32.9, adult: Secondary | ICD-10-CM | POA: Diagnosis not present

## 2019-09-03 DIAGNOSIS — I10 Essential (primary) hypertension: Secondary | ICD-10-CM | POA: Diagnosis not present

## 2019-09-03 DIAGNOSIS — Z1331 Encounter for screening for depression: Secondary | ICD-10-CM | POA: Diagnosis not present

## 2019-09-03 DIAGNOSIS — E7800 Pure hypercholesterolemia, unspecified: Secondary | ICD-10-CM | POA: Diagnosis not present

## 2019-09-03 DIAGNOSIS — Z7189 Other specified counseling: Secondary | ICD-10-CM | POA: Diagnosis not present

## 2019-09-03 DIAGNOSIS — E669 Obesity, unspecified: Secondary | ICD-10-CM | POA: Diagnosis not present

## 2019-09-03 DIAGNOSIS — Z1339 Encounter for screening examination for other mental health and behavioral disorders: Secondary | ICD-10-CM | POA: Diagnosis not present

## 2019-09-03 DIAGNOSIS — Z79899 Other long term (current) drug therapy: Secondary | ICD-10-CM | POA: Diagnosis not present

## 2019-09-03 DIAGNOSIS — Z0000 Encounter for general adult medical examination without abnormal findings: Secondary | ICD-10-CM | POA: Diagnosis not present

## 2019-09-03 DIAGNOSIS — Z125 Encounter for screening for malignant neoplasm of prostate: Secondary | ICD-10-CM | POA: Diagnosis not present

## 2019-09-18 ENCOUNTER — Ambulatory Visit (INDEPENDENT_AMBULATORY_CARE_PROVIDER_SITE_OTHER): Payer: Medicare HMO | Admitting: *Deleted

## 2019-09-18 DIAGNOSIS — I5022 Chronic systolic (congestive) heart failure: Secondary | ICD-10-CM | POA: Diagnosis not present

## 2019-09-18 LAB — CUP PACEART REMOTE DEVICE CHECK
Battery Remaining Percentage: 89 %
Battery Voltage: 3.12 V
Date Time Interrogation Session: 20210624063718
HighPow Impedance: 91 Ohm
Implantable Lead Implant Date: 20180926
Implantable Lead Location: 753860
Implantable Lead Model: 414005
Implantable Lead Serial Number: 49983487
Implantable Pulse Generator Implant Date: 20180926
Lead Channel Impedance Value: 767 Ohm
Lead Channel Pacing Threshold Amplitude: 0.4 V
Lead Channel Pacing Threshold Pulse Width: 0.4 ms
Lead Channel Sensing Intrinsic Amplitude: 20 mV
Lead Channel Sensing Intrinsic Amplitude: 5 mV
Lead Channel Setting Pacing Amplitude: 2.5 V
Lead Channel Setting Pacing Pulse Width: 0.4 ms
Lead Channel Setting Sensing Sensitivity: 0.8 mV
Pulse Gen Model: 404633
Pulse Gen Serial Number: 60969357

## 2019-09-19 NOTE — Progress Notes (Signed)
Remote ICD transmission.   

## 2019-09-24 DIAGNOSIS — E1165 Type 2 diabetes mellitus with hyperglycemia: Secondary | ICD-10-CM | POA: Diagnosis not present

## 2019-09-24 DIAGNOSIS — I10 Essential (primary) hypertension: Secondary | ICD-10-CM | POA: Diagnosis not present

## 2019-10-02 DIAGNOSIS — G894 Chronic pain syndrome: Secondary | ICD-10-CM | POA: Diagnosis not present

## 2019-10-02 DIAGNOSIS — Z6832 Body mass index (BMI) 32.0-32.9, adult: Secondary | ICD-10-CM | POA: Diagnosis not present

## 2019-10-02 DIAGNOSIS — M47896 Other spondylosis, lumbar region: Secondary | ICD-10-CM | POA: Diagnosis not present

## 2019-10-02 DIAGNOSIS — M545 Low back pain: Secondary | ICD-10-CM | POA: Diagnosis not present

## 2019-10-24 DIAGNOSIS — E1165 Type 2 diabetes mellitus with hyperglycemia: Secondary | ICD-10-CM | POA: Diagnosis not present

## 2019-10-24 DIAGNOSIS — I10 Essential (primary) hypertension: Secondary | ICD-10-CM | POA: Diagnosis not present

## 2019-10-31 DIAGNOSIS — M47896 Other spondylosis, lumbar region: Secondary | ICD-10-CM | POA: Diagnosis not present

## 2019-10-31 DIAGNOSIS — G894 Chronic pain syndrome: Secondary | ICD-10-CM | POA: Diagnosis not present

## 2019-10-31 DIAGNOSIS — Z6832 Body mass index (BMI) 32.0-32.9, adult: Secondary | ICD-10-CM | POA: Diagnosis not present

## 2019-10-31 DIAGNOSIS — M545 Low back pain: Secondary | ICD-10-CM | POA: Diagnosis not present

## 2019-11-25 DIAGNOSIS — E1165 Type 2 diabetes mellitus with hyperglycemia: Secondary | ICD-10-CM | POA: Diagnosis not present

## 2019-11-25 DIAGNOSIS — I10 Essential (primary) hypertension: Secondary | ICD-10-CM | POA: Diagnosis not present

## 2019-12-02 DIAGNOSIS — G894 Chronic pain syndrome: Secondary | ICD-10-CM | POA: Diagnosis not present

## 2019-12-02 DIAGNOSIS — M47896 Other spondylosis, lumbar region: Secondary | ICD-10-CM | POA: Diagnosis not present

## 2019-12-02 DIAGNOSIS — M545 Low back pain: Secondary | ICD-10-CM | POA: Diagnosis not present

## 2019-12-15 DIAGNOSIS — M7989 Other specified soft tissue disorders: Secondary | ICD-10-CM | POA: Diagnosis not present

## 2019-12-15 DIAGNOSIS — E1165 Type 2 diabetes mellitus with hyperglycemia: Secondary | ICD-10-CM | POA: Diagnosis not present

## 2019-12-15 DIAGNOSIS — I10 Essential (primary) hypertension: Secondary | ICD-10-CM | POA: Diagnosis not present

## 2019-12-15 DIAGNOSIS — Z299 Encounter for prophylactic measures, unspecified: Secondary | ICD-10-CM | POA: Diagnosis not present

## 2019-12-17 DIAGNOSIS — L03032 Cellulitis of left toe: Secondary | ICD-10-CM | POA: Diagnosis not present

## 2019-12-17 DIAGNOSIS — Z299 Encounter for prophylactic measures, unspecified: Secondary | ICD-10-CM | POA: Diagnosis not present

## 2019-12-18 ENCOUNTER — Ambulatory Visit (INDEPENDENT_AMBULATORY_CARE_PROVIDER_SITE_OTHER): Payer: Medicare HMO | Admitting: Emergency Medicine

## 2019-12-18 DIAGNOSIS — I255 Ischemic cardiomyopathy: Secondary | ICD-10-CM

## 2019-12-18 LAB — CUP PACEART REMOTE DEVICE CHECK
Battery Remaining Percentage: 86 %
Battery Voltage: 3.12 V
Brady Statistic RV Percent Paced: 0 %
Date Time Interrogation Session: 20210923072206
HighPow Impedance: 95 Ohm
Implantable Lead Implant Date: 20180926
Implantable Lead Location: 753860
Implantable Lead Model: 414005
Implantable Lead Serial Number: 49983487
Implantable Pulse Generator Implant Date: 20180926
Lead Channel Impedance Value: 796 Ohm
Lead Channel Pacing Threshold Amplitude: 0.4 V
Lead Channel Pacing Threshold Pulse Width: 0.4 ms
Lead Channel Sensing Intrinsic Amplitude: 20 mV
Lead Channel Sensing Intrinsic Amplitude: 6.8 mV
Lead Channel Setting Pacing Amplitude: 2.5 V
Lead Channel Setting Pacing Pulse Width: 0.4 ms
Lead Channel Setting Sensing Sensitivity: 0.8 mV
Pulse Gen Model: 404633
Pulse Gen Serial Number: 60969357

## 2019-12-23 NOTE — Progress Notes (Signed)
Remote ICD transmission.   

## 2019-12-24 DIAGNOSIS — L237 Allergic contact dermatitis due to plants, except food: Secondary | ICD-10-CM | POA: Diagnosis not present

## 2019-12-24 DIAGNOSIS — I272 Pulmonary hypertension, unspecified: Secondary | ICD-10-CM | POA: Diagnosis not present

## 2019-12-24 DIAGNOSIS — I10 Essential (primary) hypertension: Secondary | ICD-10-CM | POA: Diagnosis not present

## 2019-12-24 DIAGNOSIS — E1165 Type 2 diabetes mellitus with hyperglycemia: Secondary | ICD-10-CM | POA: Diagnosis not present

## 2019-12-24 DIAGNOSIS — Z299 Encounter for prophylactic measures, unspecified: Secondary | ICD-10-CM | POA: Diagnosis not present

## 2019-12-25 DIAGNOSIS — I10 Essential (primary) hypertension: Secondary | ICD-10-CM | POA: Diagnosis not present

## 2019-12-25 DIAGNOSIS — E1165 Type 2 diabetes mellitus with hyperglycemia: Secondary | ICD-10-CM | POA: Diagnosis not present

## 2019-12-31 DIAGNOSIS — M47896 Other spondylosis, lumbar region: Secondary | ICD-10-CM | POA: Diagnosis not present

## 2019-12-31 DIAGNOSIS — Z6832 Body mass index (BMI) 32.0-32.9, adult: Secondary | ICD-10-CM | POA: Diagnosis not present

## 2019-12-31 DIAGNOSIS — L03039 Cellulitis of unspecified toe: Secondary | ICD-10-CM | POA: Diagnosis not present

## 2019-12-31 DIAGNOSIS — M545 Low back pain, unspecified: Secondary | ICD-10-CM | POA: Diagnosis not present

## 2019-12-31 DIAGNOSIS — Z299 Encounter for prophylactic measures, unspecified: Secondary | ICD-10-CM | POA: Diagnosis not present

## 2019-12-31 DIAGNOSIS — E1165 Type 2 diabetes mellitus with hyperglycemia: Secondary | ICD-10-CM | POA: Diagnosis not present

## 2019-12-31 DIAGNOSIS — G894 Chronic pain syndrome: Secondary | ICD-10-CM | POA: Diagnosis not present

## 2019-12-31 DIAGNOSIS — I2510 Atherosclerotic heart disease of native coronary artery without angina pectoris: Secondary | ICD-10-CM | POA: Diagnosis not present

## 2019-12-31 DIAGNOSIS — I739 Peripheral vascular disease, unspecified: Secondary | ICD-10-CM | POA: Diagnosis not present

## 2020-01-05 ENCOUNTER — Other Ambulatory Visit: Payer: Self-pay | Admitting: Internal Medicine

## 2020-01-06 DIAGNOSIS — E1159 Type 2 diabetes mellitus with other circulatory complications: Secondary | ICD-10-CM | POA: Diagnosis not present

## 2020-01-06 DIAGNOSIS — E1143 Type 2 diabetes mellitus with diabetic autonomic (poly)neuropathy: Secondary | ICD-10-CM | POA: Diagnosis not present

## 2020-01-12 DIAGNOSIS — Z049 Encounter for examination and observation for unspecified reason: Secondary | ICD-10-CM | POA: Diagnosis not present

## 2020-01-12 DIAGNOSIS — I739 Peripheral vascular disease, unspecified: Secondary | ICD-10-CM | POA: Diagnosis not present

## 2020-01-20 DIAGNOSIS — I739 Peripheral vascular disease, unspecified: Secondary | ICD-10-CM | POA: Diagnosis not present

## 2020-01-20 DIAGNOSIS — I2510 Atherosclerotic heart disease of native coronary artery without angina pectoris: Secondary | ICD-10-CM | POA: Diagnosis not present

## 2020-01-20 DIAGNOSIS — E1165 Type 2 diabetes mellitus with hyperglycemia: Secondary | ICD-10-CM | POA: Diagnosis not present

## 2020-01-20 DIAGNOSIS — Z299 Encounter for prophylactic measures, unspecified: Secondary | ICD-10-CM | POA: Diagnosis not present

## 2020-01-20 DIAGNOSIS — I10 Essential (primary) hypertension: Secondary | ICD-10-CM | POA: Diagnosis not present

## 2020-01-24 DIAGNOSIS — I10 Essential (primary) hypertension: Secondary | ICD-10-CM | POA: Diagnosis not present

## 2020-01-24 DIAGNOSIS — E1165 Type 2 diabetes mellitus with hyperglycemia: Secondary | ICD-10-CM | POA: Diagnosis not present

## 2020-01-28 DIAGNOSIS — G894 Chronic pain syndrome: Secondary | ICD-10-CM | POA: Diagnosis not present

## 2020-01-28 DIAGNOSIS — M47896 Other spondylosis, lumbar region: Secondary | ICD-10-CM | POA: Diagnosis not present

## 2020-01-28 DIAGNOSIS — M545 Low back pain, unspecified: Secondary | ICD-10-CM | POA: Diagnosis not present

## 2020-02-10 DIAGNOSIS — Z0100 Encounter for examination of eyes and vision without abnormal findings: Secondary | ICD-10-CM | POA: Diagnosis not present

## 2020-02-10 DIAGNOSIS — M069 Rheumatoid arthritis, unspecified: Secondary | ICD-10-CM | POA: Diagnosis not present

## 2020-02-10 DIAGNOSIS — E119 Type 2 diabetes mellitus without complications: Secondary | ICD-10-CM | POA: Diagnosis not present

## 2020-02-10 DIAGNOSIS — H524 Presbyopia: Secondary | ICD-10-CM | POA: Diagnosis not present

## 2020-02-24 DIAGNOSIS — I10 Essential (primary) hypertension: Secondary | ICD-10-CM | POA: Diagnosis not present

## 2020-02-24 DIAGNOSIS — E1165 Type 2 diabetes mellitus with hyperglycemia: Secondary | ICD-10-CM | POA: Diagnosis not present

## 2020-03-01 DIAGNOSIS — M47896 Other spondylosis, lumbar region: Secondary | ICD-10-CM | POA: Diagnosis not present

## 2020-03-01 DIAGNOSIS — Z79899 Other long term (current) drug therapy: Secondary | ICD-10-CM | POA: Diagnosis not present

## 2020-03-01 DIAGNOSIS — Z79891 Long term (current) use of opiate analgesic: Secondary | ICD-10-CM | POA: Diagnosis not present

## 2020-03-01 DIAGNOSIS — G894 Chronic pain syndrome: Secondary | ICD-10-CM | POA: Diagnosis not present

## 2020-03-01 DIAGNOSIS — M545 Low back pain, unspecified: Secondary | ICD-10-CM | POA: Diagnosis not present

## 2020-03-18 ENCOUNTER — Ambulatory Visit (INDEPENDENT_AMBULATORY_CARE_PROVIDER_SITE_OTHER): Payer: Medicare HMO

## 2020-03-18 DIAGNOSIS — I5022 Chronic systolic (congestive) heart failure: Secondary | ICD-10-CM

## 2020-03-18 LAB — CUP PACEART REMOTE DEVICE CHECK
Battery Remaining Percentage: 84 %
Battery Voltage: 3.12 V
Brady Statistic RV Percent Paced: 0 %
Date Time Interrogation Session: 20211222082723
HighPow Impedance: 87 Ohm
Implantable Lead Implant Date: 20180926
Implantable Lead Location: 753860
Implantable Lead Model: 414005
Implantable Lead Serial Number: 49983487
Implantable Pulse Generator Implant Date: 20180926
Lead Channel Impedance Value: 724 Ohm
Lead Channel Pacing Threshold Amplitude: 0.4 V
Lead Channel Pacing Threshold Pulse Width: 0.4 ms
Lead Channel Sensing Intrinsic Amplitude: 20 mV
Lead Channel Sensing Intrinsic Amplitude: 4.4 mV
Lead Channel Setting Pacing Amplitude: 2.5 V
Lead Channel Setting Pacing Pulse Width: 0.4 ms
Lead Channel Setting Sensing Sensitivity: 0.8 mV
Pulse Gen Model: 404633
Pulse Gen Serial Number: 60969357

## 2020-03-24 DIAGNOSIS — I739 Peripheral vascular disease, unspecified: Secondary | ICD-10-CM | POA: Diagnosis not present

## 2020-03-24 DIAGNOSIS — Z1211 Encounter for screening for malignant neoplasm of colon: Secondary | ICD-10-CM | POA: Diagnosis not present

## 2020-03-24 DIAGNOSIS — E1165 Type 2 diabetes mellitus with hyperglycemia: Secondary | ICD-10-CM | POA: Diagnosis not present

## 2020-03-24 DIAGNOSIS — Z299 Encounter for prophylactic measures, unspecified: Secondary | ICD-10-CM | POA: Diagnosis not present

## 2020-03-24 DIAGNOSIS — I10 Essential (primary) hypertension: Secondary | ICD-10-CM | POA: Diagnosis not present

## 2020-03-24 DIAGNOSIS — Z23 Encounter for immunization: Secondary | ICD-10-CM | POA: Diagnosis not present

## 2020-03-24 DIAGNOSIS — I2510 Atherosclerotic heart disease of native coronary artery without angina pectoris: Secondary | ICD-10-CM | POA: Diagnosis not present

## 2020-03-25 DIAGNOSIS — I10 Essential (primary) hypertension: Secondary | ICD-10-CM | POA: Diagnosis not present

## 2020-03-25 DIAGNOSIS — E1165 Type 2 diabetes mellitus with hyperglycemia: Secondary | ICD-10-CM | POA: Diagnosis not present

## 2020-03-30 DIAGNOSIS — M545 Low back pain, unspecified: Secondary | ICD-10-CM | POA: Diagnosis not present

## 2020-03-30 DIAGNOSIS — Z79899 Other long term (current) drug therapy: Secondary | ICD-10-CM | POA: Diagnosis not present

## 2020-03-30 DIAGNOSIS — M47896 Other spondylosis, lumbar region: Secondary | ICD-10-CM | POA: Diagnosis not present

## 2020-03-30 DIAGNOSIS — Z79891 Long term (current) use of opiate analgesic: Secondary | ICD-10-CM | POA: Diagnosis not present

## 2020-03-30 DIAGNOSIS — G894 Chronic pain syndrome: Secondary | ICD-10-CM | POA: Diagnosis not present

## 2020-04-01 NOTE — Progress Notes (Signed)
Remote ICD transmission.   

## 2020-04-15 ENCOUNTER — Ambulatory Visit: Payer: Medicare HMO | Admitting: Internal Medicine

## 2020-04-15 ENCOUNTER — Other Ambulatory Visit: Payer: Self-pay

## 2020-04-15 ENCOUNTER — Encounter: Payer: Self-pay | Admitting: Internal Medicine

## 2020-04-15 VITALS — BP 168/80 | HR 66 | Ht 73.0 in | Wt 235.4 lb

## 2020-04-15 DIAGNOSIS — I5022 Chronic systolic (congestive) heart failure: Secondary | ICD-10-CM

## 2020-04-15 DIAGNOSIS — I255 Ischemic cardiomyopathy: Secondary | ICD-10-CM

## 2020-04-15 DIAGNOSIS — Z95810 Presence of automatic (implantable) cardiac defibrillator: Secondary | ICD-10-CM | POA: Diagnosis not present

## 2020-04-15 DIAGNOSIS — I10 Essential (primary) hypertension: Secondary | ICD-10-CM | POA: Diagnosis not present

## 2020-04-15 LAB — CUP PACEART INCLINIC DEVICE CHECK
Battery Voltage: 3.11 V
Brady Statistic RV Percent Paced: 0 %
Date Time Interrogation Session: 20220120132049
HighPow Impedance: 96 Ohm
Implantable Lead Implant Date: 20180926
Implantable Lead Location: 753860
Implantable Lead Model: 414005
Implantable Lead Serial Number: 49983487
Implantable Pulse Generator Implant Date: 20180926
Lead Channel Impedance Value: 869 Ohm
Lead Channel Pacing Threshold Amplitude: 0.4 V
Lead Channel Pacing Threshold Pulse Width: 0.4 ms
Lead Channel Sensing Intrinsic Amplitude: 24.2 mV
Lead Channel Sensing Intrinsic Amplitude: 5.2 mV
Lead Channel Setting Pacing Amplitude: 2.5 V
Lead Channel Setting Pacing Pulse Width: 0.4 ms
Lead Channel Setting Sensing Sensitivity: 0.8 mV
Pulse Gen Model: 404633
Pulse Gen Serial Number: 60969357

## 2020-04-15 MED ORDER — CARVEDILOL 12.5 MG PO TABS: 18.7500 mg | ORAL_TABLET | Freq: Two times a day (BID) | ORAL | 3 refills | Status: DC

## 2020-04-15 NOTE — Progress Notes (Signed)
HPI [name] returns today for ICD followup. He is a pleasant 70 yo man with chronic systolic heart failure, ICM, s/p ICD whohas done well since his new device was placed.In the interim, he has done well with no chest pain or sob. No syncope. He walks regularly. He notes that at home is blood pressure has been increased some. No Known Allergies   Current Outpatient Medications  Medication Sig Dispense Refill  . acetaminophen (TYLENOL) 500 MG tablet Take 1,000 mg by mouth 2 (two) times daily as needed for headache.    . carvedilol (COREG) 12.5 MG tablet TAKE 1 TABLET BY MOUTH TWICE DAILY 180 tablet 1  . gabapentin (NEURONTIN) 600 MG tablet Take 600 mg by mouth 4 (four) times daily.  2  . HYDROcodone-acetaminophen (NORCO) 10-325 MG tablet Take 1 tablet by mouth every 6 (six) hours as needed for moderate pain.     [name] losartan (COZAAR) 100 MG tablet Take 100 mg by mouth daily.    . metFORMIN (GLUCOPHAGE-XR) 500 MG 24 hr tablet Take 500 mg by mouth 2 (two) times daily.     [name] NARCAN 4 MG/0.1ML LIQD nasal spray kit as needed.    . QC LO-DOSE ASPIRIN 81 MG EC tablet Take 81 mg by mouth daily.  12  . tamsulosin (FLOMAX) 0.4 MG CAPS capsule Take 0.4 mg by mouth daily after breakfast.     . pravastatin (PRAVACHOL) 40 MG tablet Take 40 mg by mouth daily.      No current facility-administered medications for this visit.     Past Medical History:  Diagnosis Date  . AICD (automatic cardioverter/defibrillator) present 08/02/2016  . Arthritis    " In my hands , back, feet " (12/20/2016)  . Back pain    Recent injection with aspirin and Plavix held, patient had chest pain that day  . Blood glucose elevated    March, 2012  . CAD (coronary artery disease)     Chest pain while off Plavix, March, 2012, catheterization, LAD stents patent, worsening third diagonal 80%-use PCI only for recurrent significant symptoms /  ..NSTEMI..[id number] DES LAD (kissing balloon)  . CAD (coronary artery disease)     plavix indefinitly  . CAD (coronary artery disease)    cardiac clearance .[name].for back surgery...october 2011  . Chronic systolic heart failure ([place])   . Dyslipidemia   . Gout    April, 201 to  . Hypertension    EF 50-55%, borderline anterior hypokinesis, catheterization March, 2012  . SOB (shortness of breath)   . Type II diabetes mellitus (HCC)     ROS:   All systems reviewed and negative except as noted in the HPI.   Past Surgical History:  Procedure Laterality Date  . BACK SURGERY    . CARDIAC CATHETERIZATION  08/2005; 05/2010   [name] 08/09/2010; notes 05/29/2010  . CARDIAC DEFIBRILLATOR PLACEMENT  12/20/2016  . CORONARY ANGIOPLASTY WITH STENT PLACEMENT  08/2005   [name] 08/09/2010  . CORONARY ARTERY BYPASS GRAFT  05/2015   CABG X5  . ICD GENERATOR REMOVAL N/A 10/12/2016   Procedure: ICD Generator Removal;  Surgeon: Evans Lance, MD;  Location: [facility];  Service: Cardiovascular;  Laterality: N/A;  . ICD IMPLANT N/A 08/02/2016   Procedure: ICD Implant;  Surgeon: Evans Lance, MD;  Location: [facility];  Service: Cardiovascular;  Laterality: N/A;  . ICD IMPLANT N/A 12/20/2016   Procedure: ICD Implant;  Surgeon: Evans Lance, MD;  Location: [facility];  Service: Cardiovascular;  Laterality: N/A;  . ICD LEAD REMOVAL  10/12/2016   Procedure: Icd Lead Removal;  Surgeon: Evans Lance, MD;  Location: [facility];  Service: Cardiovascular;;  . LUMBAR DISC SURGERY  X 3     Family History  Problem Relation Age of Onset  . Coronary artery disease Father      Social History   Socioeconomic History  . Marital status: Divorced    Spouse name: Not on file  . Number of children: Not on file  . Years of education: Not on file  . Highest education level: Not on file  Occupational History  . Occupation: Systems developer: [employer]  Tobacco Use  . Smoking status: Former Smoker    Packs/day: 2.00    Years: 15.00    Pack  years: 30.00    Types: Cigarettes    Quit date: 09/24/1973    Years since quitting: 46.5  . Smokeless tobacco: Current User    Types: Snuff  . Tobacco comment: 12/20/2016 "I have been dipping snuff since I was ~  70 years old"  Vaping Use  . Vaping Use: Never used  Substance and Sexual Activity  . Alcohol use: No  . Drug use: No  . Sexual activity: Never  Other Topics Concern  . Not on file  Social History Narrative  . Not on file   Social Determinants of Health   Financial Resource Strain: Not on file  Food Insecurity: Not on file  Transportation Needs: Not on file  Physical Activity: Not on file  Stress: Not on file  Social Connections: Not on file  Intimate Partner Violence: Not on file     BP (!) 168/80   Pulse 66   Ht 6' 1"  (1.854 m)   Wt 235 lb 6.4 oz (106.8 kg)   SpO2 98%   BMI 31.06 kg/m   Physical Exam:  Well appearing NAD HEENT: Unremarkable Neck:  No JVD, no thyromegally Lymphatics:  No adenopathy Back:  No CVA tenderness Lungs:  Clear with no wheezes HEART:  Regular rate rhythm, no murmurs, no rubs, no clicks Abd:  soft, positive bowel sounds, no organomegally, no rebound, no guarding Ext:  2 plus pulses, no edema, no cyanosis, no clubbing Skin:  No rashes no nodules Neuro:  CN II through XII intact, motor grossly intact  EKG - nsr  DEVICE  Normal device function.  See PaceArt for details.   Assess/Plan: 1. Chronic systolic heart failure - his symptoms are well controlled. He will continue his current meds and maintain a low sodium diet. 2. ICD - his Biotronik VDD ICD is working normally. We will recheck in several months. 3. HTN - his bp is not well controlled. I encouraged him to maintain a low sodium diet and I have asked him to increase his coreg to 18.375 bid. 4. CAD - he denies anginal symptoms. He will continue his current meds.  Carleene Overlie Yasmyn Bellisario,MD

## 2020-04-15 NOTE — Patient Instructions (Addendum)
Medication Instructions:  Your physician has recommended you make the following change in your medication:   1.  INcrease your carvedilol 12.5 mg tablets-  TAKE 1.5 tablets (18.75 mg) by mouth TWICE a day.  Labwork: None ordered.  Testing/Procedures: None ordered.  Follow-Up: Your physician wants you to follow-up in: one year with Lewayne Bunting, MD or one of the following Advanced Practice Providers on your designated Care Team:    Gypsy Balsam, NP  Francis Dowse, PA-C  Casimiro Needle "Mardelle Matte" [place], [place]  Remote monitoring is used to monitor your ICD from home. This monitoring reduces the number of office visits required to check your device to one time per year. It allows [place] to keep an eye on the functioning of your device to ensure it is working properly. You are scheduled for a device check from home on 06/17/2020. You may send your transmission at any time that day. If you have a wireless device, the transmission will be sent automatically. After your physician reviews your transmission, you will receive a postcard with your next transmission date.  Any Other Special Instructions Will Be Listed Below (If Applicable).  If you need a refill on your cardiac medications before your next appointment, please call your pharmacy.

## 2020-04-19 ENCOUNTER — Encounter: Payer: Medicare HMO | Admitting: Internal Medicine

## 2020-04-26 DIAGNOSIS — E1165 Type 2 diabetes mellitus with hyperglycemia: Secondary | ICD-10-CM | POA: Diagnosis not present

## 2020-04-26 DIAGNOSIS — I10 Essential (primary) hypertension: Secondary | ICD-10-CM | POA: Diagnosis not present

## 2020-04-27 DIAGNOSIS — M545 Low back pain, unspecified: Secondary | ICD-10-CM | POA: Diagnosis not present

## 2020-04-27 DIAGNOSIS — G894 Chronic pain syndrome: Secondary | ICD-10-CM | POA: Diagnosis not present

## 2020-04-27 DIAGNOSIS — M47896 Other spondylosis, lumbar region: Secondary | ICD-10-CM | POA: Diagnosis not present

## 2020-05-24 DIAGNOSIS — I10 Essential (primary) hypertension: Secondary | ICD-10-CM | POA: Diagnosis not present

## 2020-05-24 DIAGNOSIS — E1165 Type 2 diabetes mellitus with hyperglycemia: Secondary | ICD-10-CM | POA: Diagnosis not present

## 2020-05-25 DIAGNOSIS — Z79891 Long term (current) use of opiate analgesic: Secondary | ICD-10-CM | POA: Diagnosis not present

## 2020-05-25 DIAGNOSIS — Z79899 Other long term (current) drug therapy: Secondary | ICD-10-CM | POA: Diagnosis not present

## 2020-05-25 DIAGNOSIS — M47896 Other spondylosis, lumbar region: Secondary | ICD-10-CM | POA: Diagnosis not present

## 2020-05-25 DIAGNOSIS — M545 Low back pain, unspecified: Secondary | ICD-10-CM | POA: Diagnosis not present

## 2020-05-25 DIAGNOSIS — G894 Chronic pain syndrome: Secondary | ICD-10-CM | POA: Diagnosis not present

## 2020-06-07 DIAGNOSIS — Z299 Encounter for prophylactic measures, unspecified: Secondary | ICD-10-CM | POA: Diagnosis not present

## 2020-06-07 DIAGNOSIS — I10 Essential (primary) hypertension: Secondary | ICD-10-CM | POA: Diagnosis not present

## 2020-06-07 DIAGNOSIS — M79602 Pain in left arm: Secondary | ICD-10-CM | POA: Diagnosis not present

## 2020-06-07 DIAGNOSIS — M79601 Pain in right arm: Secondary | ICD-10-CM | POA: Diagnosis not present

## 2020-06-16 DIAGNOSIS — Z299 Encounter for prophylactic measures, unspecified: Secondary | ICD-10-CM | POA: Diagnosis not present

## 2020-06-16 DIAGNOSIS — I10 Essential (primary) hypertension: Secondary | ICD-10-CM | POA: Diagnosis not present

## 2020-06-16 DIAGNOSIS — M79601 Pain in right arm: Secondary | ICD-10-CM | POA: Diagnosis not present

## 2020-06-16 DIAGNOSIS — M25441 Effusion, right hand: Secondary | ICD-10-CM | POA: Diagnosis not present

## 2020-06-16 DIAGNOSIS — M79602 Pain in left arm: Secondary | ICD-10-CM | POA: Diagnosis not present

## 2020-06-17 ENCOUNTER — Ambulatory Visit (INDEPENDENT_AMBULATORY_CARE_PROVIDER_SITE_OTHER): Payer: Medicare HMO

## 2020-06-17 DIAGNOSIS — I255 Ischemic cardiomyopathy: Secondary | ICD-10-CM | POA: Diagnosis not present

## 2020-06-18 LAB — CUP PACEART REMOTE DEVICE CHECK
Date Time Interrogation Session: 20220324182317
Implantable Lead Implant Date: 20180926
Implantable Lead Location: 753860
Implantable Lead Model: 414005
Implantable Lead Serial Number: 49983487
Implantable Pulse Generator Implant Date: 20180926
Pulse Gen Model: 404633
Pulse Gen Serial Number: 60969357

## 2020-06-22 DIAGNOSIS — M545 Low back pain, unspecified: Secondary | ICD-10-CM | POA: Diagnosis not present

## 2020-06-22 DIAGNOSIS — G894 Chronic pain syndrome: Secondary | ICD-10-CM | POA: Diagnosis not present

## 2020-06-22 DIAGNOSIS — M47896 Other spondylosis, lumbar region: Secondary | ICD-10-CM | POA: Diagnosis not present

## 2020-06-24 DIAGNOSIS — I10 Essential (primary) hypertension: Secondary | ICD-10-CM | POA: Diagnosis not present

## 2020-06-24 DIAGNOSIS — E1165 Type 2 diabetes mellitus with hyperglycemia: Secondary | ICD-10-CM | POA: Diagnosis not present

## 2020-06-29 NOTE — Progress Notes (Signed)
Remote ICD transmission.   

## 2020-07-08 DIAGNOSIS — Z299 Encounter for prophylactic measures, unspecified: Secondary | ICD-10-CM | POA: Diagnosis not present

## 2020-07-08 DIAGNOSIS — I2510 Atherosclerotic heart disease of native coronary artery without angina pectoris: Secondary | ICD-10-CM | POA: Diagnosis not present

## 2020-07-08 DIAGNOSIS — I10 Essential (primary) hypertension: Secondary | ICD-10-CM | POA: Diagnosis not present

## 2020-07-08 DIAGNOSIS — E1165 Type 2 diabetes mellitus with hyperglycemia: Secondary | ICD-10-CM | POA: Diagnosis not present

## 2020-07-08 DIAGNOSIS — I739 Peripheral vascular disease, unspecified: Secondary | ICD-10-CM | POA: Diagnosis not present

## 2020-07-23 DIAGNOSIS — E1165 Type 2 diabetes mellitus with hyperglycemia: Secondary | ICD-10-CM | POA: Diagnosis not present

## 2020-07-23 DIAGNOSIS — I10 Essential (primary) hypertension: Secondary | ICD-10-CM | POA: Diagnosis not present

## 2020-07-26 DIAGNOSIS — M545 Low back pain, unspecified: Secondary | ICD-10-CM | POA: Diagnosis not present

## 2020-07-26 DIAGNOSIS — Z6832 Body mass index (BMI) 32.0-32.9, adult: Secondary | ICD-10-CM | POA: Diagnosis not present

## 2020-07-26 DIAGNOSIS — M47896 Other spondylosis, lumbar region: Secondary | ICD-10-CM | POA: Diagnosis not present

## 2020-07-26 DIAGNOSIS — G894 Chronic pain syndrome: Secondary | ICD-10-CM | POA: Diagnosis not present

## 2020-08-24 DIAGNOSIS — Z79899 Other long term (current) drug therapy: Secondary | ICD-10-CM | POA: Diagnosis not present

## 2020-08-24 DIAGNOSIS — L03115 Cellulitis of right lower limb: Secondary | ICD-10-CM | POA: Diagnosis not present

## 2020-08-24 DIAGNOSIS — I10 Essential (primary) hypertension: Secondary | ICD-10-CM | POA: Diagnosis not present

## 2020-08-24 DIAGNOSIS — M545 Low back pain, unspecified: Secondary | ICD-10-CM | POA: Diagnosis not present

## 2020-08-24 DIAGNOSIS — Z79891 Long term (current) use of opiate analgesic: Secondary | ICD-10-CM | POA: Diagnosis not present

## 2020-08-24 DIAGNOSIS — N183 Chronic kidney disease, stage 3 unspecified: Secondary | ICD-10-CM | POA: Diagnosis not present

## 2020-08-24 DIAGNOSIS — M47896 Other spondylosis, lumbar region: Secondary | ICD-10-CM | POA: Diagnosis not present

## 2020-08-24 DIAGNOSIS — Z299 Encounter for prophylactic measures, unspecified: Secondary | ICD-10-CM | POA: Diagnosis not present

## 2020-08-24 DIAGNOSIS — Z6832 Body mass index (BMI) 32.0-32.9, adult: Secondary | ICD-10-CM | POA: Diagnosis not present

## 2020-08-24 DIAGNOSIS — G894 Chronic pain syndrome: Secondary | ICD-10-CM | POA: Diagnosis not present

## 2020-08-24 DIAGNOSIS — E1165 Type 2 diabetes mellitus with hyperglycemia: Secondary | ICD-10-CM | POA: Diagnosis not present

## 2020-08-24 DIAGNOSIS — I509 Heart failure, unspecified: Secondary | ICD-10-CM | POA: Diagnosis not present

## 2020-08-31 DIAGNOSIS — D692 Other nonthrombocytopenic purpura: Secondary | ICD-10-CM | POA: Diagnosis not present

## 2020-08-31 DIAGNOSIS — Z299 Encounter for prophylactic measures, unspecified: Secondary | ICD-10-CM | POA: Diagnosis not present

## 2020-08-31 DIAGNOSIS — L03115 Cellulitis of right lower limb: Secondary | ICD-10-CM | POA: Diagnosis not present

## 2020-08-31 DIAGNOSIS — I700 Atherosclerosis of aorta: Secondary | ICD-10-CM | POA: Diagnosis not present

## 2020-08-31 DIAGNOSIS — I10 Essential (primary) hypertension: Secondary | ICD-10-CM | POA: Diagnosis not present

## 2020-08-31 DIAGNOSIS — M069 Rheumatoid arthritis, unspecified: Secondary | ICD-10-CM | POA: Diagnosis not present

## 2020-08-31 DIAGNOSIS — Z6833 Body mass index (BMI) 33.0-33.9, adult: Secondary | ICD-10-CM | POA: Diagnosis not present

## 2020-09-07 DIAGNOSIS — L03039 Cellulitis of unspecified toe: Secondary | ICD-10-CM | POA: Diagnosis not present

## 2020-09-07 DIAGNOSIS — I10 Essential (primary) hypertension: Secondary | ICD-10-CM | POA: Diagnosis not present

## 2020-09-07 DIAGNOSIS — Z299 Encounter for prophylactic measures, unspecified: Secondary | ICD-10-CM | POA: Diagnosis not present

## 2020-09-07 DIAGNOSIS — I509 Heart failure, unspecified: Secondary | ICD-10-CM | POA: Diagnosis not present

## 2020-09-07 DIAGNOSIS — Z713 Dietary counseling and surveillance: Secondary | ICD-10-CM | POA: Diagnosis not present

## 2020-09-08 DIAGNOSIS — E7800 Pure hypercholesterolemia, unspecified: Secondary | ICD-10-CM | POA: Diagnosis not present

## 2020-09-08 DIAGNOSIS — Z79899 Other long term (current) drug therapy: Secondary | ICD-10-CM | POA: Diagnosis not present

## 2020-09-08 DIAGNOSIS — Z125 Encounter for screening for malignant neoplasm of prostate: Secondary | ICD-10-CM | POA: Diagnosis not present

## 2020-09-08 DIAGNOSIS — Z7189 Other specified counseling: Secondary | ICD-10-CM | POA: Diagnosis not present

## 2020-09-08 DIAGNOSIS — I10 Essential (primary) hypertension: Secondary | ICD-10-CM | POA: Diagnosis not present

## 2020-09-08 DIAGNOSIS — Z1331 Encounter for screening for depression: Secondary | ICD-10-CM | POA: Diagnosis not present

## 2020-09-08 DIAGNOSIS — Z1339 Encounter for screening examination for other mental health and behavioral disorders: Secondary | ICD-10-CM | POA: Diagnosis not present

## 2020-09-08 DIAGNOSIS — R5383 Other fatigue: Secondary | ICD-10-CM | POA: Diagnosis not present

## 2020-09-08 DIAGNOSIS — Z0000 Encounter for general adult medical examination without abnormal findings: Secondary | ICD-10-CM | POA: Diagnosis not present

## 2020-09-08 DIAGNOSIS — Z299 Encounter for prophylactic measures, unspecified: Secondary | ICD-10-CM | POA: Diagnosis not present

## 2020-09-08 DIAGNOSIS — Z6832 Body mass index (BMI) 32.0-32.9, adult: Secondary | ICD-10-CM | POA: Diagnosis not present

## 2020-09-15 LAB — CUP PACEART REMOTE DEVICE CHECK
Battery Remaining Percentage: 78 %
Battery Voltage: 3.12 V
Brady Statistic RV Percent Paced: 0 %
Date Time Interrogation Session: 20220622084510
HighPow Impedance: 93 Ohm
Implantable Lead Implant Date: 20180926
Implantable Lead Location: 753860
Implantable Lead Model: 414005
Implantable Lead Serial Number: 49983487
Implantable Pulse Generator Implant Date: 20180926
Lead Channel Impedance Value: 825 Ohm
Lead Channel Pacing Threshold Amplitude: 0.4 V
Lead Channel Pacing Threshold Pulse Width: 0.4 ms
Lead Channel Sensing Intrinsic Amplitude: 20 mV
Lead Channel Sensing Intrinsic Amplitude: 6.4 mV
Lead Channel Setting Pacing Amplitude: 2.5 V
Lead Channel Setting Pacing Pulse Width: 0.4 ms
Lead Channel Setting Sensing Sensitivity: 0.8 mV
Pulse Gen Model: 404633
Pulse Gen Serial Number: 60969357

## 2020-09-16 ENCOUNTER — Ambulatory Visit (INDEPENDENT_AMBULATORY_CARE_PROVIDER_SITE_OTHER): Payer: Medicare HMO

## 2020-09-16 DIAGNOSIS — I255 Ischemic cardiomyopathy: Secondary | ICD-10-CM

## 2020-09-22 DIAGNOSIS — M545 Low back pain, unspecified: Secondary | ICD-10-CM | POA: Diagnosis not present

## 2020-09-22 DIAGNOSIS — M47896 Other spondylosis, lumbar region: Secondary | ICD-10-CM | POA: Diagnosis not present

## 2020-09-22 DIAGNOSIS — G894 Chronic pain syndrome: Secondary | ICD-10-CM | POA: Diagnosis not present

## 2020-09-23 DIAGNOSIS — I10 Essential (primary) hypertension: Secondary | ICD-10-CM | POA: Diagnosis not present

## 2020-09-23 DIAGNOSIS — E1165 Type 2 diabetes mellitus with hyperglycemia: Secondary | ICD-10-CM | POA: Diagnosis not present

## 2020-10-06 NOTE — Progress Notes (Signed)
Remote ICD transmission.   

## 2020-10-22 DIAGNOSIS — I10 Essential (primary) hypertension: Secondary | ICD-10-CM | POA: Diagnosis not present

## 2020-10-22 DIAGNOSIS — E1165 Type 2 diabetes mellitus with hyperglycemia: Secondary | ICD-10-CM | POA: Diagnosis not present

## 2020-10-25 DIAGNOSIS — E1165 Type 2 diabetes mellitus with hyperglycemia: Secondary | ICD-10-CM | POA: Diagnosis not present

## 2020-10-25 DIAGNOSIS — I700 Atherosclerosis of aorta: Secondary | ICD-10-CM | POA: Diagnosis not present

## 2020-10-25 DIAGNOSIS — I10 Essential (primary) hypertension: Secondary | ICD-10-CM | POA: Diagnosis not present

## 2020-10-25 DIAGNOSIS — Z299 Encounter for prophylactic measures, unspecified: Secondary | ICD-10-CM | POA: Diagnosis not present

## 2020-10-25 DIAGNOSIS — I509 Heart failure, unspecified: Secondary | ICD-10-CM | POA: Diagnosis not present

## 2020-10-25 DIAGNOSIS — N183 Chronic kidney disease, stage 3 unspecified: Secondary | ICD-10-CM | POA: Diagnosis not present

## 2020-10-29 DIAGNOSIS — M545 Low back pain, unspecified: Secondary | ICD-10-CM | POA: Diagnosis not present

## 2020-10-29 DIAGNOSIS — M47896 Other spondylosis, lumbar region: Secondary | ICD-10-CM | POA: Diagnosis not present

## 2020-10-29 DIAGNOSIS — G894 Chronic pain syndrome: Secondary | ICD-10-CM | POA: Diagnosis not present

## 2020-11-24 DIAGNOSIS — E1165 Type 2 diabetes mellitus with hyperglycemia: Secondary | ICD-10-CM | POA: Diagnosis not present

## 2020-11-24 DIAGNOSIS — I10 Essential (primary) hypertension: Secondary | ICD-10-CM | POA: Diagnosis not present

## 2020-11-26 DIAGNOSIS — M47896 Other spondylosis, lumbar region: Secondary | ICD-10-CM | POA: Diagnosis not present

## 2020-11-26 DIAGNOSIS — G894 Chronic pain syndrome: Secondary | ICD-10-CM | POA: Diagnosis not present

## 2020-11-26 DIAGNOSIS — M545 Low back pain, unspecified: Secondary | ICD-10-CM | POA: Diagnosis not present

## 2020-12-16 ENCOUNTER — Ambulatory Visit (INDEPENDENT_AMBULATORY_CARE_PROVIDER_SITE_OTHER): Payer: Medicare HMO

## 2020-12-16 DIAGNOSIS — I255 Ischemic cardiomyopathy: Secondary | ICD-10-CM

## 2020-12-18 LAB — CUP PACEART REMOTE DEVICE CHECK
Battery Remaining Percentage: 75 %
Battery Voltage: 3.12 V
Brady Statistic RV Percent Paced: 0 %
Date Time Interrogation Session: 20220922122840
HighPow Impedance: 91 Ohm
Implantable Lead Implant Date: 20180926
Implantable Lead Location: 753860
Implantable Lead Model: 414005
Implantable Lead Serial Number: 49983487
Implantable Pulse Generator Implant Date: 20180926
Lead Channel Impedance Value: 767 Ohm
Lead Channel Pacing Threshold Amplitude: 0.4 V
Lead Channel Pacing Threshold Pulse Width: 0.4 ms
Lead Channel Sensing Intrinsic Amplitude: 20 mV
Lead Channel Setting Pacing Amplitude: 2.5 V
Lead Channel Setting Pacing Pulse Width: 0.4 ms
Lead Channel Setting Sensing Sensitivity: 0.8 mV
Pulse Gen Model: 404633
Pulse Gen Serial Number: 60969357

## 2020-12-23 DIAGNOSIS — M545 Low back pain, unspecified: Secondary | ICD-10-CM | POA: Diagnosis not present

## 2020-12-23 DIAGNOSIS — M47896 Other spondylosis, lumbar region: Secondary | ICD-10-CM | POA: Diagnosis not present

## 2020-12-23 DIAGNOSIS — G894 Chronic pain syndrome: Secondary | ICD-10-CM | POA: Diagnosis not present

## 2020-12-23 NOTE — Progress Notes (Signed)
Remote ICD transmission.   

## 2020-12-24 DIAGNOSIS — I10 Essential (primary) hypertension: Secondary | ICD-10-CM | POA: Diagnosis not present

## 2020-12-24 DIAGNOSIS — E1165 Type 2 diabetes mellitus with hyperglycemia: Secondary | ICD-10-CM | POA: Diagnosis not present

## 2021-01-21 DIAGNOSIS — G894 Chronic pain syndrome: Secondary | ICD-10-CM | POA: Diagnosis not present

## 2021-01-21 DIAGNOSIS — M545 Low back pain, unspecified: Secondary | ICD-10-CM | POA: Diagnosis not present

## 2021-01-21 DIAGNOSIS — M5126 Other intervertebral disc displacement, lumbar region: Secondary | ICD-10-CM | POA: Diagnosis not present

## 2021-01-24 DIAGNOSIS — E1165 Type 2 diabetes mellitus with hyperglycemia: Secondary | ICD-10-CM | POA: Diagnosis not present

## 2021-01-24 DIAGNOSIS — I10 Essential (primary) hypertension: Secondary | ICD-10-CM | POA: Diagnosis not present

## 2021-02-08 DIAGNOSIS — M25512 Pain in left shoulder: Secondary | ICD-10-CM | POA: Diagnosis not present

## 2021-02-08 DIAGNOSIS — E1165 Type 2 diabetes mellitus with hyperglycemia: Secondary | ICD-10-CM | POA: Diagnosis not present

## 2021-02-08 DIAGNOSIS — Z299 Encounter for prophylactic measures, unspecified: Secondary | ICD-10-CM | POA: Diagnosis not present

## 2021-02-08 DIAGNOSIS — K219 Gastro-esophageal reflux disease without esophagitis: Secondary | ICD-10-CM | POA: Diagnosis not present

## 2021-02-08 DIAGNOSIS — M25511 Pain in right shoulder: Secondary | ICD-10-CM | POA: Diagnosis not present

## 2021-02-08 DIAGNOSIS — I10 Essential (primary) hypertension: Secondary | ICD-10-CM | POA: Diagnosis not present

## 2021-02-23 DIAGNOSIS — E1165 Type 2 diabetes mellitus with hyperglycemia: Secondary | ICD-10-CM | POA: Diagnosis not present

## 2021-02-23 DIAGNOSIS — I10 Essential (primary) hypertension: Secondary | ICD-10-CM | POA: Diagnosis not present

## 2021-02-25 DIAGNOSIS — M545 Low back pain, unspecified: Secondary | ICD-10-CM | POA: Diagnosis not present

## 2021-02-25 DIAGNOSIS — Z79891 Long term (current) use of opiate analgesic: Secondary | ICD-10-CM | POA: Diagnosis not present

## 2021-02-25 DIAGNOSIS — Z79899 Other long term (current) drug therapy: Secondary | ICD-10-CM | POA: Diagnosis not present

## 2021-02-25 DIAGNOSIS — G894 Chronic pain syndrome: Secondary | ICD-10-CM | POA: Diagnosis not present

## 2021-02-25 DIAGNOSIS — M5126 Other intervertebral disc displacement, lumbar region: Secondary | ICD-10-CM | POA: Diagnosis not present

## 2021-03-17 ENCOUNTER — Ambulatory Visit (INDEPENDENT_AMBULATORY_CARE_PROVIDER_SITE_OTHER): Payer: Medicare HMO

## 2021-03-17 DIAGNOSIS — I255 Ischemic cardiomyopathy: Secondary | ICD-10-CM

## 2021-03-17 LAB — CUP PACEART REMOTE DEVICE CHECK
Battery Remaining Percentage: 72 %
Brady Statistic RV Percent Paced: 0 %
Date Time Interrogation Session: 20221222075340
HighPow Impedance: 85 Ohm
Implantable Lead Implant Date: 20180926
Implantable Lead Location: 753860
Implantable Lead Model: 414005
Implantable Lead Serial Number: 49983487
Implantable Pulse Generator Implant Date: 20180926
Lead Channel Impedance Value: 796 Ohm
Lead Channel Pacing Threshold Amplitude: 0.4 V
Lead Channel Pacing Threshold Pulse Width: 0.4 ms
Lead Channel Sensing Intrinsic Amplitude: 20 mV
Lead Channel Sensing Intrinsic Amplitude: 5.2 mV
Lead Channel Setting Pacing Amplitude: 2.5 V
Lead Channel Setting Pacing Pulse Width: 0.4 ms
Lead Channel Setting Sensing Sensitivity: 0.8 mV
Pulse Gen Model: 404633
Pulse Gen Serial Number: 60969357

## 2021-03-25 DIAGNOSIS — I10 Essential (primary) hypertension: Secondary | ICD-10-CM | POA: Diagnosis not present

## 2021-03-25 DIAGNOSIS — E1165 Type 2 diabetes mellitus with hyperglycemia: Secondary | ICD-10-CM | POA: Diagnosis not present

## 2021-03-29 NOTE — Progress Notes (Signed)
Remote ICD transmission.   

## 2021-03-30 DIAGNOSIS — M5126 Other intervertebral disc displacement, lumbar region: Secondary | ICD-10-CM | POA: Diagnosis not present

## 2021-03-30 DIAGNOSIS — G894 Chronic pain syndrome: Secondary | ICD-10-CM | POA: Diagnosis not present

## 2021-03-30 DIAGNOSIS — M545 Low back pain, unspecified: Secondary | ICD-10-CM | POA: Diagnosis not present

## 2021-04-24 DIAGNOSIS — E1165 Type 2 diabetes mellitus with hyperglycemia: Secondary | ICD-10-CM | POA: Diagnosis not present

## 2021-04-24 DIAGNOSIS — I10 Essential (primary) hypertension: Secondary | ICD-10-CM | POA: Diagnosis not present

## 2021-04-25 ENCOUNTER — Other Ambulatory Visit: Payer: Self-pay | Admitting: Internal Medicine

## 2021-04-27 DIAGNOSIS — M5126 Other intervertebral disc displacement, lumbar region: Secondary | ICD-10-CM | POA: Diagnosis not present

## 2021-04-27 DIAGNOSIS — G894 Chronic pain syndrome: Secondary | ICD-10-CM | POA: Diagnosis not present

## 2021-04-27 DIAGNOSIS — M545 Low back pain, unspecified: Secondary | ICD-10-CM | POA: Diagnosis not present

## 2021-04-28 ENCOUNTER — Ambulatory Visit: Payer: Medicare HMO | Admitting: Internal Medicine

## 2021-04-28 ENCOUNTER — Other Ambulatory Visit: Payer: Self-pay

## 2021-04-28 ENCOUNTER — Encounter: Payer: Self-pay | Admitting: Internal Medicine

## 2021-04-28 ENCOUNTER — Encounter (INDEPENDENT_AMBULATORY_CARE_PROVIDER_SITE_OTHER): Payer: Self-pay

## 2021-04-28 VITALS — BP 132/68 | HR 58 | Ht 73.0 in | Wt 242.0 lb

## 2021-04-28 DIAGNOSIS — I5022 Chronic systolic (congestive) heart failure: Secondary | ICD-10-CM

## 2021-04-28 DIAGNOSIS — I10 Essential (primary) hypertension: Secondary | ICD-10-CM | POA: Diagnosis not present

## 2021-04-28 DIAGNOSIS — Z95810 Presence of automatic (implantable) cardiac defibrillator: Secondary | ICD-10-CM

## 2021-04-28 NOTE — Progress Notes (Signed)
HPI [name] returns today for ICD followup. He is a pleasant 71yo man with chronic systolic heart failure, ICM, s/p ICD who has done well since his new device was placed. In the interim, he has done well with no chest pain or sob. No syncope. He walks regularly. He notes that at home is blood pressure has been increased some No Known Allergies   Current Outpatient Medications  Medication Sig Dispense Refill   acetaminophen (TYLENOL) 500 MG tablet Take 1,000 mg by mouth 2 (two) times daily as needed for headache.     carvedilol (COREG) 12.5 MG tablet TAKE 1 AND 1/2 TABLETS BY MOUTH TWICE DAILY 270 tablet 1   furosemide (LASIX) 20 MG tablet Take 20 mg by mouth daily as needed for fluid.     gabapentin (NEURONTIN) 600 MG tablet Take 600 mg by mouth 4 (four) times daily.  2   glipiZIDE (GLUCOTROL XL) 2.5 MG 24 hr tablet Take 2.5 mg by mouth daily.     HYDROcodone-acetaminophen (NORCO) 10-325 MG tablet Take 1 tablet by mouth every 6 (six) hours as needed for moderate pain.      losartan (COZAAR) 100 MG tablet Take 100 mg by mouth daily.     metFORMIN (GLUCOPHAGE-XR) 500 MG 24 hr tablet Take 500 mg by mouth 2 (two) times daily.      NARCAN 4 MG/0.1ML LIQD nasal spray kit as needed.     QC LO-DOSE ASPIRIN 81 MG EC tablet Take 81 mg by mouth daily.  12   tamsulosin (FLOMAX) 0.4 MG CAPS capsule Take 0.4 mg by mouth daily after breakfast.      pravastatin (PRAVACHOL) 40 MG tablet Take 40 mg by mouth daily.      No current facility-administered medications for this visit.     Past Medical History:  Diagnosis Date   AICD (automatic cardioverter/defibrillator) present 08/02/2016   Arthritis    " In my hands , back, feet " (12/20/2016)   Back pain    Recent injection with aspirin and Plavix held, patient had chest pain that day   Blood glucose elevated    March, 2012   CAD (coronary artery disease)     Chest pain while off Plavix, March, 2012, catheterization, LAD stents patent,  worsening third diagonal 80%-use PCI only for recurrent significant symptoms /  ..NSTEMI..2007two DES LAD (kissing balloon)   CAD (coronary artery disease)    plavix indefinitly   CAD (coronary artery disease)    cardiac clearance .[name].for back surgery...october 2011   Chronic systolic heart failure ([place])    Dyslipidemia    Gout    April, 201 to   Hypertension    EF 50-55%, borderline anterior hypokinesis, catheterization March, 2012   SOB (shortness of breath)    Type II diabetes mellitus ([place])     ROS:   All systems reviewed and negative except as noted in the HPI.   Past Surgical History:  Procedure Laterality Date   BACK SURGERY     CARDIAC CATHETERIZATION  08/2005; 05/2010   [name] 08/09/2010; notes 05/29/2010   CARDIAC DEFIBRILLATOR PLACEMENT  12/20/2016   CORONARY ANGIOPLASTY WITH STENT PLACEMENT  08/2005   [name] 08/09/2010   CORONARY ARTERY BYPASS GRAFT  05/2015   CABG X5   ICD GENERATOR REMOVAL N/A 10/12/2016   Procedure: ICD Generator Removal;  Surgeon: Evans Lance, MD;  Location: [facility];  Service: Cardiovascular;  Laterality: N/A;   ICD IMPLANT N/A 08/02/2016  Procedure: ICD Implant;  Surgeon: Evans Lance, MD;  Location: [facility];  Service: Cardiovascular;  Laterality: N/A;   ICD IMPLANT N/A 12/20/2016   Procedure: ICD Implant;  Surgeon: Evans Lance, MD;  Location: [facility];  Service: Cardiovascular;  Laterality: N/A;   ICD LEAD REMOVAL  10/12/2016   Procedure: Icd Lead Removal;  Surgeon: Evans Lance, MD;  Location: [facility];  Service: Cardiovascular;;   LUMBAR DISC SURGERY  X 3     Family History  Problem Relation Age of Onset   Coronary artery disease Father      Social History   Socioeconomic History   Marital status: Divorced    Spouse name: Not on file   Number of children: Not on file   Years of education: Not on file   Highest education level: Not on file  Occupational History   Occupation:  Engineer, manufacturing systems    Employer: [employer]  Tobacco Use   Smoking status: Former    Packs/day: 2.00    Years: 15.00    Pack years: 30.00    Types: Cigarettes    Quit date: 09/24/1973    Years since quitting: 47.6   Smokeless tobacco: Current    Types: Snuff   Tobacco comments:    12/20/2016 "I have been dipping snuff since I was ~  71 years old"  Vaping Use   Vaping Use: Never used  Substance and Sexual Activity   Alcohol use: No   Drug use: No   Sexual activity: Never  Other Topics Concern   Not on file  Social History Narrative   Not on file   Social Determinants of Health   Financial Resource Strain: Not on file  Food Insecurity: Not on file  Transportation Needs: Not on file  Physical Activity: Not on file  Stress: Not on file  Social Connections: Not on file  Intimate Partner Violence: Not on file     BP 132/68    Pulse (!) 58    Ht 6' 1"  (1.854 m)    Wt 242 lb (109.8 kg)    SpO2 98%    BMI 31.93 kg/m   Physical Exam:  Well appearing NAD HEENT: Unremarkable Neck:  No JVD, no thyromegally Lymphatics:  No adenopathy Back:  No CVA tenderness Lungs:  Clear with no wheeze HEART:  Regular rate rhythm, no murmurs, no rubs, no clicks Abd:  soft, positive bowel sounds, no organomegally, no rebound, no guarding Ext:  2 plus pulses, no edema, no cyanosis, no clubbing Skin:  No rashes no nodules Neuro:  CN II through XII intact, motor grossly intact  EKG - sinus brady  DEVICE  Normal device function.  See PaceArt for details.   Assess/Plan:  1. Chronic systolic heart failure - his symptoms are well controlled. He will continue his current meds and maintain a low sodium diet. 2. ICD - his Biotronik VDD ICD is working normally. We will recheck in several months. 3. HTN - his bp is not well controlled. I encouraged him to maintain a low sodium diet and I have asked him to continue his coreg to 18.375 bid. 4. CAD - he denies anginal symptoms. He will continue his  current meds.   [name],MD

## 2021-04-28 NOTE — Patient Instructions (Signed)
Medication Instructions:  Your physician recommends that you continue on your current medications as directed. Please refer to the Current Medication list given to you today.  Labwork: None ordered.  Testing/Procedures: None ordered.  Follow-Up: Your physician wants you to follow-up in: one year with Lewayne Bunting, MD or one of the following Advanced Practice Providers on your designated Care Team:   Francis Dowse, [place] Casimiro Needle "Mardelle Matte" Lanna Poche, [place]  Remote monitoring is used to monitor your ICD from home. This monitoring reduces the number of office visits required to check your device to one time per year. It allows [place] to keep an eye on the functioning of your device to ensure it is working properly. You are scheduled for a device check from home on 06/16/2021. You may send your transmission at any time that day. If you have a wireless device, the transmission will be sent automatically. After your physician reviews your transmission, you will receive a postcard with your next transmission date.  Any Other Special Instructions Will Be Listed Below (If Applicable).  If you need a refill on your cardiac medications before your next appointment, please call your pharmacy.

## 2021-05-09 LAB — CUP PACEART INCLINIC DEVICE CHECK
Battery Voltage: 3.12 V
Brady Statistic RV Percent Paced: 0 %
Date Time Interrogation Session: 20230202130500
HighPow Impedance: 88 Ohm
Implantable Lead Implant Date: 20180926
Implantable Lead Location: 753860
Implantable Lead Model: 414005
Implantable Lead Serial Number: 49983487
Implantable Pulse Generator Implant Date: 20180926
Lead Channel Impedance Value: 883 Ohm
Lead Channel Pacing Threshold Amplitude: 0.4 V
Lead Channel Pacing Threshold Pulse Width: 0.4 ms
Lead Channel Sensing Intrinsic Amplitude: 24.2 mV
Lead Channel Sensing Intrinsic Amplitude: 7.1 mV
Lead Channel Setting Pacing Amplitude: 2.5 V
Lead Channel Setting Pacing Pulse Width: 0.4 ms
Lead Channel Setting Sensing Sensitivity: 0.8 mV
Pulse Gen Model: 404633
Pulse Gen Serial Number: 60969357

## 2021-05-17 DIAGNOSIS — N183 Chronic kidney disease, stage 3 unspecified: Secondary | ICD-10-CM | POA: Diagnosis not present

## 2021-05-17 DIAGNOSIS — M069 Rheumatoid arthritis, unspecified: Secondary | ICD-10-CM | POA: Diagnosis not present

## 2021-05-17 DIAGNOSIS — I700 Atherosclerosis of aorta: Secondary | ICD-10-CM | POA: Diagnosis not present

## 2021-05-17 DIAGNOSIS — E1165 Type 2 diabetes mellitus with hyperglycemia: Secondary | ICD-10-CM | POA: Diagnosis not present

## 2021-05-17 DIAGNOSIS — I509 Heart failure, unspecified: Secondary | ICD-10-CM | POA: Diagnosis not present

## 2021-05-17 DIAGNOSIS — E1122 Type 2 diabetes mellitus with diabetic chronic kidney disease: Secondary | ICD-10-CM | POA: Diagnosis not present

## 2021-05-17 DIAGNOSIS — Z299 Encounter for prophylactic measures, unspecified: Secondary | ICD-10-CM | POA: Diagnosis not present

## 2021-05-17 DIAGNOSIS — E1151 Type 2 diabetes mellitus with diabetic peripheral angiopathy without gangrene: Secondary | ICD-10-CM | POA: Diagnosis not present

## 2021-05-17 DIAGNOSIS — I10 Essential (primary) hypertension: Secondary | ICD-10-CM | POA: Diagnosis not present

## 2021-05-24 DIAGNOSIS — I10 Essential (primary) hypertension: Secondary | ICD-10-CM | POA: Diagnosis not present

## 2021-05-24 DIAGNOSIS — E1165 Type 2 diabetes mellitus with hyperglycemia: Secondary | ICD-10-CM | POA: Diagnosis not present

## 2021-05-25 DIAGNOSIS — G894 Chronic pain syndrome: Secondary | ICD-10-CM | POA: Diagnosis not present

## 2021-05-25 DIAGNOSIS — Z6832 Body mass index (BMI) 32.0-32.9, adult: Secondary | ICD-10-CM | POA: Diagnosis not present

## 2021-05-25 DIAGNOSIS — M5126 Other intervertebral disc displacement, lumbar region: Secondary | ICD-10-CM | POA: Diagnosis not present

## 2021-05-25 DIAGNOSIS — M545 Low back pain, unspecified: Secondary | ICD-10-CM | POA: Diagnosis not present

## 2021-06-16 ENCOUNTER — Ambulatory Visit (INDEPENDENT_AMBULATORY_CARE_PROVIDER_SITE_OTHER): Payer: Medicare HMO

## 2021-06-16 DIAGNOSIS — I5022 Chronic systolic (congestive) heart failure: Secondary | ICD-10-CM | POA: Diagnosis not present

## 2021-06-16 DIAGNOSIS — I255 Ischemic cardiomyopathy: Secondary | ICD-10-CM

## 2021-06-17 LAB — CUP PACEART REMOTE DEVICE CHECK
Date Time Interrogation Session: 20230323065241
Implantable Lead Implant Date: 20180926
Implantable Lead Location: 753860
Implantable Lead Model: 414005
Implantable Lead Serial Number: 49983487
Implantable Pulse Generator Implant Date: 20180926
Pulse Gen Model: 404633
Pulse Gen Serial Number: 60969357

## 2021-06-22 DIAGNOSIS — M5126 Other intervertebral disc displacement, lumbar region: Secondary | ICD-10-CM | POA: Diagnosis not present

## 2021-06-22 DIAGNOSIS — G894 Chronic pain syndrome: Secondary | ICD-10-CM | POA: Diagnosis not present

## 2021-06-22 DIAGNOSIS — M545 Low back pain, unspecified: Secondary | ICD-10-CM | POA: Diagnosis not present

## 2021-06-23 DIAGNOSIS — I10 Essential (primary) hypertension: Secondary | ICD-10-CM | POA: Diagnosis not present

## 2021-06-23 DIAGNOSIS — E1165 Type 2 diabetes mellitus with hyperglycemia: Secondary | ICD-10-CM | POA: Diagnosis not present

## 2021-06-24 NOTE — Progress Notes (Signed)
Remote ICD transmission.   

## 2021-07-20 DIAGNOSIS — G894 Chronic pain syndrome: Secondary | ICD-10-CM | POA: Diagnosis not present

## 2021-07-20 DIAGNOSIS — I10 Essential (primary) hypertension: Secondary | ICD-10-CM | POA: Diagnosis not present

## 2021-07-24 DIAGNOSIS — E1165 Type 2 diabetes mellitus with hyperglycemia: Secondary | ICD-10-CM | POA: Diagnosis not present

## 2021-07-24 DIAGNOSIS — I10 Essential (primary) hypertension: Secondary | ICD-10-CM | POA: Diagnosis not present

## 2021-08-08 ENCOUNTER — Telehealth: Payer: Self-pay

## 2021-08-08 NOTE — Telephone Encounter (Signed)
ERI detected ?First detected on Aug 06, 2021 1:57:03 AM ? ?Pt needs appointment with EP APP to check device. ? ?Sent to scheduling. ?

## 2021-08-16 ENCOUNTER — Encounter: Payer: Self-pay | Admitting: Student

## 2021-08-16 ENCOUNTER — Ambulatory Visit: Payer: Medicare HMO | Admitting: Student

## 2021-08-16 VITALS — BP 142/76 | HR 57 | Ht 73.0 in | Wt 233.0 lb

## 2021-08-16 DIAGNOSIS — Z95810 Presence of automatic (implantable) cardiac defibrillator: Secondary | ICD-10-CM

## 2021-08-16 DIAGNOSIS — I5022 Chronic systolic (congestive) heart failure: Secondary | ICD-10-CM

## 2021-08-16 DIAGNOSIS — I255 Ischemic cardiomyopathy: Secondary | ICD-10-CM

## 2021-08-16 DIAGNOSIS — I10 Essential (primary) hypertension: Secondary | ICD-10-CM | POA: Diagnosis not present

## 2021-08-16 LAB — CUP PACEART INCLINIC DEVICE CHECK
Date Time Interrogation Session: 20230523121843
Implantable Lead Implant Date: 20180926
Implantable Lead Location: 753860
Implantable Lead Model: 414005
Implantable Lead Serial Number: 49983487
Implantable Pulse Generator Implant Date: 20180926
Pulse Gen Model: 404633
Pulse Gen Serial Number: 60969357

## 2021-08-16 NOTE — Progress Notes (Unsigned)
Electrophysiology Office Note Date: 08/16/2021  ID:  Anthony, Adams Aug 07, 1950, MRN [id number]  PCP: Glenda Chroman, MD Primary Cardiologist: None Electrophysiologist: Cristopher Peru, MD   CC: Routine ICD follow-up  Anthony Adams is a 71 y.o. male seen today for Cristopher Peru, MD for acute visit due to ERI / early battery depletion of ICD .  Since last being seen in our clinic the patient reports doing OK.  he denies chest pain, palpitations, dyspnea, PND, orthopnea, nausea, vomiting, dizziness, syncope, edema, weight gain, or early satiety. He has not had ICD shocks.   Device History: Biotronik Single Chamber ICD implanted 07/2016, extracted 09/2016 for infected pocket, re-implanted right side 11/2016 for ICM, Chronic systolic CHF  Past Medical History:  Diagnosis Date   AICD (automatic cardioverter/defibrillator) present 08/02/2016   Arthritis    " In my hands , back, feet " (12/20/2016)   Back pain    Recent injection with aspirin and Plavix held, patient had chest pain that day   Blood glucose elevated    March, 2012   CAD (coronary artery disease)     Chest pain while off Plavix, March, 2012, catheterization, LAD stents patent, worsening third diagonal 80%-use PCI only for recurrent significant symptoms /  ..NSTEMI..2007two DES LAD (kissing balloon)   CAD (coronary artery disease)    plavix indefinitly   CAD (coronary artery disease)    cardiac clearance .[name].for back surgery...october 2011   Chronic systolic heart failure ([place])    Dyslipidemia    Gout    April, 201 to   Hypertension    EF 50-55%, borderline anterior hypokinesis, catheterization March, 2012   SOB (shortness of breath)    Type II diabetes mellitus [facility])    Past Surgical History:  Procedure Laterality Date   BACK SURGERY     CARDIAC CATHETERIZATION  08/2005; 05/2010   [name] 08/09/2010; notes 05/29/2010   CARDIAC DEFIBRILLATOR PLACEMENT  12/20/2016   CORONARY ANGIOPLASTY WITH STENT PLACEMENT  08/2005    [name] 08/09/2010   CORONARY ARTERY BYPASS GRAFT  05/2015   CABG X5   ICD GENERATOR REMOVAL N/A 10/12/2016   Procedure: ICD Generator Removal;  Surgeon: Evans Lance, MD;  Location: [place] [facility];  Service: Cardiovascular;  Laterality: N/A;   ICD IMPLANT N/A 08/02/2016   Procedure: ICD Implant;  Surgeon: Evans Lance, MD;  Location: [place] [facility];  Service: Cardiovascular;  Laterality: N/A;   ICD IMPLANT N/A 12/20/2016   Procedure: ICD Implant;  Surgeon: Evans Lance, MD;  Location: [place] [facility];  Service: Cardiovascular;  Laterality: N/A;   ICD LEAD REMOVAL  10/12/2016   Procedure: Icd Lead Removal;  Surgeon: Evans Lance, MD;  Location: [employer] [facility];  Service: Cardiovascular;;   LUMBAR DISC SURGERY  X 3    Current Outpatient Medications  Medication Sig Dispense Refill   acetaminophen (TYLENOL) 500 MG tablet Take 1,000 mg by mouth 2 (two) times daily as needed for headache.     carvedilol (COREG) 12.5 MG tablet TAKE 1 AND 1/2 TABLETS BY MOUTH TWICE DAILY 270 tablet 1   furosemide (LASIX) 20 MG tablet Take 20 mg by mouth daily as needed for fluid.     gabapentin (NEURONTIN) 600 MG tablet Take 600 mg by mouth 4 (four) times daily.  2   glipiZIDE (GLUCOTROL XL) 2.5 MG 24 hr tablet Take 2.5 mg by mouth daily.     HYDROcodone-acetaminophen (NORCO) 10-325 MG tablet Take  1 tablet by mouth every 6 (six) hours as needed for moderate pain.      losartan (COZAAR) 100 MG tablet Take 100 mg by mouth daily.     metFORMIN (GLUCOPHAGE-XR) 500 MG 24 hr tablet Take 500 mg by mouth 2 (two) times daily.      NARCAN 4 MG/0.1ML LIQD nasal spray kit as needed.     pravastatin (PRAVACHOL) 40 MG tablet Take 40 mg by mouth daily.      QC LO-DOSE ASPIRIN 81 MG EC tablet Take 81 mg by mouth daily.  12   tamsulosin (FLOMAX) 0.4 MG CAPS capsule Take 0.4 mg by mouth daily after breakfast.      No current facility-administered medications for this visit.    Allergies:   Patient has no  known allergies.   Social History: Social History   Socioeconomic History   Marital status: Divorced    Spouse name: Not on file   Number of children: Not on file   Years of education: Not on file   Highest education level: Not on file  Occupational History   Occupation: Engineer, manufacturing systems    Employer: [employer]  Tobacco Use   Smoking status: Former    Packs/day: 2.00    Years: 15.00    Pack years: 30.00    Types: Cigarettes    Quit date: 09/24/1973    Years since quitting: 47.9   Smokeless tobacco: Current    Types: Snuff   Tobacco comments:    12/20/2016 "I have been dipping snuff since I was ~  71 years old"  Vaping Use   Vaping Use: Never used  Substance and Sexual Activity   Alcohol use: No   Drug use: No   Sexual activity: Never  Other Topics Concern   Not on file  Social History Narrative   Not on file   Social Determinants of Health   Financial Resource Strain: Not on file  Food Insecurity: Not on file  Transportation Needs: Not on file  Physical Activity: Not on file  Stress: Not on file  Social Connections: Not on file  Intimate Partner Violence: Not on file    Family History: Family History  Problem Relation Age of Onset   Coronary artery disease Father     Review of Systems: All other systems reviewed and are otherwise negative except as noted above.   Physical Exam: There were no vitals filed for this visit.   GEN- The patient is well appearing, alert and oriented x 3 today.   HEENT: normocephalic, atraumatic; sclera clear, conjunctiva pink; hearing intact; oropharynx clear; neck supple, no JVP Lymph- no cervical lymphadenopathy Lungs- Clear to ausculation bilaterally, normal work of breathing.  No wheezes, rales, rhonchi Heart- Regular rate and rhythm, no murmurs, rubs or gallops, PMI not laterally displaced GI- soft, non-tender, non-distended, bowel sounds present, no hepatosplenomegaly Extremities- no clubbing or cyanosis. No edema;  DP/PT/radial pulses 2+ bilaterally MS- no significant deformity or atrophy Skin- warm and dry, no rash or lesion; ICD pocket well healed Psych- euthymic mood, full affect Neuro- strength and sensation are intact  ICD interrogation- reviewed in detail today,  See PACEART report  EKG:  EKG is ordered today. Personal review of EKG ordered today shows NSR at 57 bpm  Recent Labs: No results found for requested labs within last 8760 hours.   Wt Readings from Last 3 Encounters:  04/28/21 242 lb (109.8 kg)  04/15/20 235 lb 6.4 oz (106.8 kg)  04/22/19 238 lb  3.2 oz (108 kg)     Other studies Reviewed: Additional studies/ records that were reviewed today include: Previous EP office notes.   Assessment and Plan:  1.  Chronic systolic dysfunction s/p Biotronik single chamber ICD  euvolemic today Stable on an appropriate medical regimen Limited testing today for device with early battery depletion, at ERI as of 08/06/2021. Per biotronik should proceed at next available time.  See Claudia Desanctis Art report No changes today Given his history of device infection, will update Echo ASAP to make sure gen change is indicated.  He is not dependent, and has never had ICD therapy.   2. HTN Stable on current regimen   3. CAD  Denies s/s ischemia  Current medicines are reviewed at length with the patient today.      Disposition:   Follow up with Dr. Lovena Le in as usual post gen change if indicated post echo.    Jacalyn Lefevre, PA-C  08/16/2021 11:26 AM  [facility] [address] [place] [address] [phone] (office) [phone] (fax)

## 2021-08-16 NOTE — Patient Instructions (Addendum)
Medication Instructions:  Your physician recommends that you continue on your current medications as directed. Please refer to the Current Medication list given to you today.  *If you need a refill on your cardiac medications before your next appointment, please call your pharmacy*   Lab Work: TODAY: BMET, CBC  If you have labs (blood work) drawn today and your tests are completely normal, you will receive your results only by: [place] (if you have MyChart) OR A paper copy in the mail If you have any lab test that is abnormal or we need to change your treatment, we will call you to review the results.  Testing: Please schedule before procedure on 09/08/2021!! Your physician has requested that you have an echocardiogram. Echocardiography is a painless test that uses sound waves to create images of your heart. It provides your doctor with information about the size and shape of your heart and how well your heart's chambers and valves are working. This procedure takes approximately one hour. There are no restrictions for this procedure.   Follow-Up: At [facility], you and your health needs are our priority.  As part of our continuing mission to provide you with exceptional heart care, we have created designated Provider Care Teams.  These Care Teams include your primary Cardiologist (physician) and Advanced Practice Providers (APPs -  Physician Assistants and Nurse Practitioners) who all work together to provide you with the care you need, when you need it.  We recommend signing up for the patient portal called "MyChart".  Sign up information is provided on this After Visit Summary.  MyChart is used to connect with patients for Virtual Visits (Telemedicine).  Patients are able to view lab/test results, encounter notes, upcoming appointments, etc.  Non-urgent messages can be sent to your provider as well.   To learn more about what you can do with MyChart, go to [email or web address].     Your next appointment:   We will call you to schedule  Other Instructions See letter

## 2021-08-17 DIAGNOSIS — G894 Chronic pain syndrome: Secondary | ICD-10-CM | POA: Diagnosis not present

## 2021-08-17 DIAGNOSIS — Z79891 Long term (current) use of opiate analgesic: Secondary | ICD-10-CM | POA: Diagnosis not present

## 2021-08-17 DIAGNOSIS — M5126 Other intervertebral disc displacement, lumbar region: Secondary | ICD-10-CM | POA: Diagnosis not present

## 2021-08-17 DIAGNOSIS — Z79899 Other long term (current) drug therapy: Secondary | ICD-10-CM | POA: Diagnosis not present

## 2021-08-17 LAB — BASIC METABOLIC PANEL
BUN/Creatinine Ratio: 14 (ref 10–24)
BUN: 17 mg/dL (ref 8–27)
CO2: 22 mmol/L (ref 20–29)
Calcium: 9.1 mg/dL (ref 8.6–10.2)
Chloride: 105 mmol/L (ref 96–106)
Creatinine, Ser: 1.24 mg/dL (ref 0.76–1.27)
Glucose: 156 mg/dL — ABNORMAL HIGH (ref 70–99)
Potassium: 4.8 mmol/L (ref 3.5–5.2)
Sodium: 142 mmol/L (ref 134–144)
eGFR: 63 mL/min/{1.73_m2} (ref >59)

## 2021-08-17 LAB — CBC WITH DIFFERENTIAL/PLATELET
Basophils Absolute: 0.1 10*3/uL (ref 0.0–0.2)
Basos: 1 %
EOS (ABSOLUTE): 0.3 10*3/uL (ref 0.0–0.4)
Eos: 5 %
Hematocrit: 36.4 % — ABNORMAL LOW (ref 37.5–51.0)
Hemoglobin: 12.4 g/dL — ABNORMAL LOW (ref 13.0–17.7)
Immature Grans (Abs): 0 10*3/uL (ref 0.0–0.1)
Immature Granulocytes: 1 %
Lymphocytes Absolute: 1.4 10*3/uL (ref 0.7–3.1)
Lymphs: 25 %
MCH: 28.6 pg (ref 26.6–33.0)
MCHC: 34.1 g/dL (ref 31.5–35.7)
MCV: 84 fL (ref 79–97)
Monocytes Absolute: 0.3 10*3/uL (ref 0.1–0.9)
Monocytes: 6 %
Neutrophils Absolute: 3.5 10*3/uL (ref 1.4–7.0)
Neutrophils: 62 %
Platelets: 134 10*3/uL — ABNORMAL LOW (ref 150–450)
RBC: 4.34 x10E6/uL (ref 4.14–5.80)
RDW: 13.8 % (ref 11.6–15.4)
WBC: 5.5 10*3/uL (ref 3.4–10.8)

## 2021-08-23 DIAGNOSIS — I10 Essential (primary) hypertension: Secondary | ICD-10-CM | POA: Diagnosis not present

## 2021-08-23 DIAGNOSIS — E1165 Type 2 diabetes mellitus with hyperglycemia: Secondary | ICD-10-CM | POA: Diagnosis not present

## 2021-08-24 DIAGNOSIS — Z6829 Body mass index (BMI) 29.0-29.9, adult: Secondary | ICD-10-CM | POA: Diagnosis not present

## 2021-08-24 DIAGNOSIS — N183 Chronic kidney disease, stage 3 unspecified: Secondary | ICD-10-CM | POA: Diagnosis not present

## 2021-08-24 DIAGNOSIS — I10 Essential (primary) hypertension: Secondary | ICD-10-CM | POA: Diagnosis not present

## 2021-08-24 DIAGNOSIS — E1165 Type 2 diabetes mellitus with hyperglycemia: Secondary | ICD-10-CM | POA: Diagnosis not present

## 2021-08-24 DIAGNOSIS — I509 Heart failure, unspecified: Secondary | ICD-10-CM | POA: Diagnosis not present

## 2021-08-24 DIAGNOSIS — E1122 Type 2 diabetes mellitus with diabetic chronic kidney disease: Secondary | ICD-10-CM | POA: Diagnosis not present

## 2021-08-24 DIAGNOSIS — Z299 Encounter for prophylactic measures, unspecified: Secondary | ICD-10-CM | POA: Diagnosis not present

## 2021-09-02 ENCOUNTER — Ambulatory Visit (HOSPITAL_COMMUNITY): Payer: Medicare HMO | Attending: Internal Medicine

## 2021-09-02 DIAGNOSIS — I5022 Chronic systolic (congestive) heart failure: Secondary | ICD-10-CM | POA: Insufficient documentation

## 2021-09-02 DIAGNOSIS — I088 Other rheumatic multiple valve diseases: Secondary | ICD-10-CM

## 2021-09-02 DIAGNOSIS — I255 Ischemic cardiomyopathy: Secondary | ICD-10-CM | POA: Insufficient documentation

## 2021-09-02 DIAGNOSIS — Z95810 Presence of automatic (implantable) cardiac defibrillator: Secondary | ICD-10-CM | POA: Diagnosis not present

## 2021-09-02 DIAGNOSIS — I517 Cardiomegaly: Secondary | ICD-10-CM | POA: Diagnosis not present

## 2021-09-02 DIAGNOSIS — I5030 Unspecified diastolic (congestive) heart failure: Secondary | ICD-10-CM

## 2021-09-03 LAB — ECHOCARDIOGRAM COMPLETE
Area-P 1/2: 3.27 cm2
S' Lateral: 4.1 cm

## 2021-09-07 ENCOUNTER — Ambulatory Visit (INDEPENDENT_AMBULATORY_CARE_PROVIDER_SITE_OTHER): Payer: Medicare HMO | Admitting: Internal Medicine

## 2021-09-07 ENCOUNTER — Encounter: Payer: Self-pay | Admitting: Internal Medicine

## 2021-09-07 ENCOUNTER — Telehealth: Payer: Self-pay | Admitting: *Deleted

## 2021-09-07 VITALS — Ht 73.0 in | Wt 233.0 lb

## 2021-09-07 DIAGNOSIS — I5022 Chronic systolic (congestive) heart failure: Secondary | ICD-10-CM

## 2021-09-07 DIAGNOSIS — I10 Essential (primary) hypertension: Secondary | ICD-10-CM | POA: Diagnosis not present

## 2021-09-07 DIAGNOSIS — Z95810 Presence of automatic (implantable) cardiac defibrillator: Secondary | ICD-10-CM | POA: Diagnosis not present

## 2021-09-07 NOTE — Patient Instructions (Addendum)
Medication Instructions:  Your physician recommends that you continue on your current medications as directed. Please refer to the Current Medication list given to you today.  Labwork: None ordered.  Testing/Procedures: We will cancel your generator change.   Follow-Up: Your physician wants you to follow-up in: 6 months with Lewayne BuntingGregg Taylor, MD.    Any Other Special Instructions Will Be Listed Below (If Applicable).  If you need a refill on your cardiac medications before your next appointment, please call your pharmacy.   Important Information About Sugar

## 2021-09-07 NOTE — Progress Notes (Signed)
Electrophysiology TeleHealth Note    Date:  09/07/2021   ID:  Anthony Adams, DOB [birth year], MRN [id number]  Location: patient's home  Provider location: [address] [place]  Evaluation Performed: Follow-up visit  PCP:  Glenda Chroman, MD  Cardiologist:  None Lovena Le Electrophysiologist:  Dr Lovena Le  Chief Complaint:  telephone visit to discuss treatment of early ICD battery depletion.  History of Present Illness:    Anthony Adams is a 71 y.o. male who presents via audio/video conferencing for a telehealth visit today.  He has been found to have a prematurely depleted ICD battery and a repeat echo demonstrated that his EF had normalized on medical therapy. He has not had syncope and he has never had any therapies from his ICD. He feels well.  Past Medical History:  Diagnosis Date   AICD (automatic cardioverter/defibrillator) present 08/02/2016   Arthritis    " In my hands , back, feet " (12/20/2016)   Back pain    Recent injection with aspirin and Plavix held, patient had chest pain that day   Blood glucose elevated    March, 2012   CAD (coronary artery disease)     Chest pain while off Plavix, March, 2012, catheterization, LAD stents patent, worsening third diagonal 80%-use PCI only for recurrent significant symptoms /  ..NSTEMI..2007two DES LAD (kissing balloon)   CAD (coronary artery disease)    plavix indefinitly   CAD (coronary artery disease)    cardiac clearance .[name].for back surgery...october 2011   Chronic systolic heart failure ([place])    Dyslipidemia    Gout    April, 201 to   Hypertension    EF 50-55%, borderline anterior hypokinesis, catheterization March, 2012   SOB (shortness of breath)    Type II diabetes mellitus [facility])     Past Surgical History:  Procedure Laterality Date   BACK SURGERY     CARDIAC CATHETERIZATION  08/2005; 05/2010   [name] 08/09/2010; notes 05/29/2010   CARDIAC DEFIBRILLATOR PLACEMENT  12/20/2016   CORONARY ANGIOPLASTY WITH  STENT PLACEMENT  08/2005   [name] 08/09/2010   CORONARY ARTERY BYPASS GRAFT  05/2015   CABG X5   ICD GENERATOR REMOVAL N/A 10/12/2016   Procedure: ICD Generator Removal;  Surgeon: Evans Lance, MD;  Location: [place] [facility];  Service: Cardiovascular;  Laterality: N/A;   ICD IMPLANT N/A 08/02/2016   Procedure: ICD Implant;  Surgeon: Evans Lance, MD;  Location: [place] [facility];  Service: Cardiovascular;  Laterality: N/A;   ICD IMPLANT N/A 12/20/2016   Procedure: ICD Implant;  Surgeon: Evans Lance, MD;  Location: [place] [facility];  Service: Cardiovascular;  Laterality: N/A;   ICD LEAD REMOVAL  10/12/2016   Procedure: Icd Lead Removal;  Surgeon: Evans Lance, MD;  Location: [place] [facility];  Service: Cardiovascular;;   LUMBAR DISC SURGERY  X 3    Current Outpatient Medications  Medication Sig Dispense Refill   acetaminophen (TYLENOL) 500 MG tablet Take 1,000 mg by mouth 2 (two) times daily as needed for headache.     carvedilol (COREG) 12.5 MG tablet TAKE 1 AND 1/2 TABLETS BY MOUTH TWICE DAILY 270 tablet 1   diclofenac Sodium (VOLTAREN) 1 % GEL Apply 4 g topically daily as needed for pain.     furosemide (LASIX) 20 MG tablet Take 20 mg by mouth daily as needed for fluid.     gabapentin (NEURONTIN) 600 MG tablet Take 600 mg  by mouth 4 (four) times daily.  2   HYDROcodone-acetaminophen (NORCO) 10-325 MG tablet Take 1 tablet by mouth in the morning, at noon, in the evening, and at bedtime.     losartan (COZAAR) 100 MG tablet Take 100 mg by mouth daily.     metFORMIN (GLUCOPHAGE-XR) 500 MG 24 hr tablet Take 500 mg by mouth 2 (two) times daily.      NARCAN 4 MG/0.1ML LIQD nasal spray kit Place 1 spray into the nose daily as needed (Overdose reverse).     pravastatin (PRAVACHOL) 40 MG tablet Take 40 mg by mouth daily.      QC LO-DOSE ASPIRIN 81 MG EC tablet Take 81 mg by mouth daily.  12   tamsulosin (FLOMAX) 0.4 MG CAPS capsule Take 0.4 mg by mouth daily after breakfast.       No current facility-administered medications for this visit.    Allergies:   Patient has no known allergies.   Social History:  The patient  reports that he quit smoking about 47 years ago. His smoking use included cigarettes. He has a 30.00 pack-year smoking history. His smokeless tobacco use includes snuff. He reports that he does not drink alcohol and does not use drugs.   Family History:  The patient's  family history includes Coronary artery disease in his father.   ROS:  Please see the history of present illness.   All other systems are personally reviewed and negative.    Exam:    Vital Signs:  Ht 6' 1"  (1.854 m)   Wt 233 lb (105.7 kg)   BMI 30.74 kg/m   Well appearing, alert and conversant, regular work of breathing,  good skin color Eyes- anicteric, neuro- grossly intact, skin- no apparent rash or lesions or cyanosis, mouth- oral mucosa is pink   Labs/Other Tests and Data Reviewed:    Recent Labs: 08/16/2021: BUN 17; Creatinine, Ser 1.24; Hemoglobin 12.4; Platelets 134; Potassium 4.8; Sodium 142   Wt Readings from Last 3 Encounters:  09/07/21 233 lb (105.7 kg)  08/16/21 233 lb (105.7 kg)  04/28/21 242 lb (109.8 kg)     Other studies personally reviewed: Additional studies/ records that were reviewed today include: ICD interrogation review  ASSESSMENT & PLAN:    1.  Prior CM - his EF has normalized. I have recommended not proceeding with another ICD insertion. 2. ICD - his device has reached ERI prematurely. I have recommended holding off on ICD gen change due to the normalization of his LV function and no prior arrhythmias.   COVID 19 screen The patient denies symptoms of COVID 19 at this time.  The importance of social distancing was discussed today.  Follow-up:  6 months Next remote: n/a  Current medicines are reviewed at length with the patient today.   The patient does not have concerns regarding his medicines.  The following changes were made today:   none  Labs/ tests ordered today include: none No orders of the defined types were placed in this encounter.    Patient Risk:  after full review of this patients clinical status, I feel that they are at moderate risk at this time.  Today, I have spent 15 minutes with the patient with telehealth technology discussing all of above .    Signed, Cristopher Peru, MD  09/07/2021 9:15 AM     [facility] [address] [place] [address] [phone] (office) [phone] (fax)

## 2021-09-07 NOTE — Telephone Encounter (Signed)
  Patient Consent for Virtual Visit        Anthony Adams has provided verbal consent on 09/07/2021 for a virtual visit (video or telephone).   CONSENT FOR VIRTUAL VISIT FOR:  Anthony Adams  By participating in this virtual visit I agree to the following:  I hereby voluntarily request, consent and authorize [facility] and its employed or contracted physicians, physician assistants, nurse practitioners or other licensed health care professionals (the Practitioner), to provide me with telemedicine health care services (the "Services") as deemed necessary by the treating Practitioner. I acknowledge and consent to receive the Services by the Practitioner via telemedicine. I understand that the telemedicine visit will involve communicating with the Practitioner through live audiovisual communication technology and the disclosure of certain medical information by electronic transmission. I acknowledge that I have been given the opportunity to request an in-person assessment or other available alternative prior to the telemedicine visit and am voluntarily participating in the telemedicine visit.  I understand that I have the right to withhold or withdraw my consent to the use of telemedicine in the course of my care at any time, without affecting my right to future care or treatment, and that the Practitioner or I may terminate the telemedicine visit at any time. I understand that I have the right to inspect all information obtained and/or recorded in the course of the telemedicine visit and may receive copies of available information for a reasonable fee.  I understand that some of the potential risks of receiving the Services via telemedicine include:  Delay or interruption in medical evaluation due to technological equipment failure or disruption; Information transmitted may not be sufficient (e.g. poor resolution of images) to allow for appropriate medical decision making by the Practitioner; and/or   In rare instances, security protocols could fail, causing a breach of personal health information.  Furthermore, I acknowledge that it is my responsibility to provide information about my medical history, conditions and care that is complete and accurate to the best of my ability. I acknowledge that Practitioner's advice, recommendations, and/or decision may be based on factors not within their control, such as incomplete or inaccurate data provided by me or distortions of diagnostic images or specimens that may result from electronic transmissions. I understand that the practice of medicine is not an exact science and that Practitioner makes no warranties or guarantees regarding treatment outcomes. I acknowledge that a copy of this consent can be made available to me via my patient portal [facility] MyChart), or I can request a printed copy by calling the office of [facility].    I understand that my insurance will be billed for this visit.   I have read or had this consent read to me. I understand the contents of this consent, which adequately explains the benefits and risks of the Services being provided via telemedicine.  I have been provided ample opportunity to ask questions regarding this consent and the Services and have had my questions answered to my satisfaction. I give my informed consent for the services to be provided through the use of telemedicine in my medical care

## 2021-09-08 ENCOUNTER — Ambulatory Visit (HOSPITAL_COMMUNITY): Admission: RE | Admit: 2021-09-08 | Payer: Medicare HMO | Source: Home / Self Care | Admitting: Internal Medicine

## 2021-09-08 ENCOUNTER — Encounter (HOSPITAL_COMMUNITY): Admission: RE | Payer: Self-pay | Source: Home / Self Care

## 2021-09-08 SURGERY — ICD GENERATOR CHANGEOUT

## 2021-09-14 DIAGNOSIS — R5383 Other fatigue: Secondary | ICD-10-CM | POA: Diagnosis not present

## 2021-09-14 DIAGNOSIS — Z0000 Encounter for general adult medical examination without abnormal findings: Secondary | ICD-10-CM | POA: Diagnosis not present

## 2021-09-14 DIAGNOSIS — Z6830 Body mass index (BMI) 30.0-30.9, adult: Secondary | ICD-10-CM | POA: Diagnosis not present

## 2021-09-14 DIAGNOSIS — E7800 Pure hypercholesterolemia, unspecified: Secondary | ICD-10-CM | POA: Diagnosis not present

## 2021-09-14 DIAGNOSIS — I10 Essential (primary) hypertension: Secondary | ICD-10-CM | POA: Diagnosis not present

## 2021-09-14 DIAGNOSIS — Z125 Encounter for screening for malignant neoplasm of prostate: Secondary | ICD-10-CM | POA: Diagnosis not present

## 2021-09-14 DIAGNOSIS — E6609 Other obesity due to excess calories: Secondary | ICD-10-CM | POA: Diagnosis not present

## 2021-09-14 DIAGNOSIS — D692 Other nonthrombocytopenic purpura: Secondary | ICD-10-CM | POA: Diagnosis not present

## 2021-09-14 DIAGNOSIS — Z1331 Encounter for screening for depression: Secondary | ICD-10-CM | POA: Diagnosis not present

## 2021-09-14 DIAGNOSIS — Z79899 Other long term (current) drug therapy: Secondary | ICD-10-CM | POA: Diagnosis not present

## 2021-09-14 DIAGNOSIS — Z299 Encounter for prophylactic measures, unspecified: Secondary | ICD-10-CM | POA: Diagnosis not present

## 2021-09-14 DIAGNOSIS — Z1339 Encounter for screening examination for other mental health and behavioral disorders: Secondary | ICD-10-CM | POA: Diagnosis not present

## 2021-09-14 DIAGNOSIS — Z7189 Other specified counseling: Secondary | ICD-10-CM | POA: Diagnosis not present

## 2021-09-20 DIAGNOSIS — G894 Chronic pain syndrome: Secondary | ICD-10-CM | POA: Diagnosis not present

## 2021-09-20 DIAGNOSIS — M5126 Other intervertebral disc displacement, lumbar region: Secondary | ICD-10-CM | POA: Diagnosis not present

## 2021-09-21 ENCOUNTER — Ambulatory Visit: Payer: Medicare HMO

## 2021-09-22 DIAGNOSIS — I10 Essential (primary) hypertension: Secondary | ICD-10-CM | POA: Diagnosis not present

## 2021-09-22 DIAGNOSIS — E1165 Type 2 diabetes mellitus with hyperglycemia: Secondary | ICD-10-CM | POA: Diagnosis not present

## 2021-09-23 ENCOUNTER — Other Ambulatory Visit: Payer: Self-pay | Admitting: Internal Medicine

## 2021-10-18 DIAGNOSIS — G894 Chronic pain syndrome: Secondary | ICD-10-CM | POA: Diagnosis not present

## 2021-10-18 DIAGNOSIS — M5126 Other intervertebral disc displacement, lumbar region: Secondary | ICD-10-CM | POA: Diagnosis not present

## 2021-10-24 DIAGNOSIS — I10 Essential (primary) hypertension: Secondary | ICD-10-CM | POA: Diagnosis not present

## 2021-10-24 DIAGNOSIS — E1165 Type 2 diabetes mellitus with hyperglycemia: Secondary | ICD-10-CM | POA: Diagnosis not present

## 2021-11-17 DIAGNOSIS — Z299 Encounter for prophylactic measures, unspecified: Secondary | ICD-10-CM | POA: Diagnosis not present

## 2021-11-17 DIAGNOSIS — I10 Essential (primary) hypertension: Secondary | ICD-10-CM | POA: Diagnosis not present

## 2021-11-17 DIAGNOSIS — Z6829 Body mass index (BMI) 29.0-29.9, adult: Secondary | ICD-10-CM | POA: Diagnosis not present

## 2021-11-17 DIAGNOSIS — E11621 Type 2 diabetes mellitus with foot ulcer: Secondary | ICD-10-CM | POA: Diagnosis not present

## 2021-11-17 DIAGNOSIS — L97509 Non-pressure chronic ulcer of other part of unspecified foot with unspecified severity: Secondary | ICD-10-CM | POA: Diagnosis not present

## 2021-11-21 DIAGNOSIS — M79605 Pain in left leg: Secondary | ICD-10-CM | POA: Diagnosis not present

## 2021-11-21 DIAGNOSIS — M79604 Pain in right leg: Secondary | ICD-10-CM | POA: Diagnosis not present

## 2021-11-21 DIAGNOSIS — I739 Peripheral vascular disease, unspecified: Secondary | ICD-10-CM | POA: Diagnosis not present

## 2021-11-23 DIAGNOSIS — I10 Essential (primary) hypertension: Secondary | ICD-10-CM | POA: Diagnosis not present

## 2021-11-23 DIAGNOSIS — G894 Chronic pain syndrome: Secondary | ICD-10-CM | POA: Diagnosis not present

## 2021-11-23 DIAGNOSIS — M5126 Other intervertebral disc displacement, lumbar region: Secondary | ICD-10-CM | POA: Diagnosis not present

## 2021-11-23 DIAGNOSIS — E1165 Type 2 diabetes mellitus with hyperglycemia: Secondary | ICD-10-CM | POA: Diagnosis not present

## 2021-11-24 DIAGNOSIS — K219 Gastro-esophageal reflux disease without esophagitis: Secondary | ICD-10-CM | POA: Diagnosis not present

## 2021-11-24 DIAGNOSIS — I10 Essential (primary) hypertension: Secondary | ICD-10-CM | POA: Diagnosis not present

## 2021-12-08 DIAGNOSIS — D696 Thrombocytopenia, unspecified: Secondary | ICD-10-CM | POA: Diagnosis not present

## 2021-12-08 DIAGNOSIS — I10 Essential (primary) hypertension: Secondary | ICD-10-CM | POA: Diagnosis not present

## 2021-12-08 DIAGNOSIS — E1159 Type 2 diabetes mellitus with other circulatory complications: Secondary | ICD-10-CM | POA: Diagnosis not present

## 2021-12-08 DIAGNOSIS — Z6829 Body mass index (BMI) 29.0-29.9, adult: Secondary | ICD-10-CM | POA: Diagnosis not present

## 2021-12-08 DIAGNOSIS — I152 Hypertension secondary to endocrine disorders: Secondary | ICD-10-CM | POA: Diagnosis not present

## 2021-12-08 DIAGNOSIS — Z299 Encounter for prophylactic measures, unspecified: Secondary | ICD-10-CM | POA: Diagnosis not present

## 2021-12-08 DIAGNOSIS — Z23 Encounter for immunization: Secondary | ICD-10-CM | POA: Diagnosis not present

## 2021-12-14 ENCOUNTER — Encounter: Payer: Medicare HMO | Admitting: Internal Medicine

## 2021-12-22 DIAGNOSIS — M5126 Other intervertebral disc displacement, lumbar region: Secondary | ICD-10-CM | POA: Diagnosis not present

## 2021-12-22 DIAGNOSIS — G894 Chronic pain syndrome: Secondary | ICD-10-CM | POA: Diagnosis not present

## 2021-12-23 DIAGNOSIS — I10 Essential (primary) hypertension: Secondary | ICD-10-CM | POA: Diagnosis not present

## 2021-12-23 DIAGNOSIS — E1165 Type 2 diabetes mellitus with hyperglycemia: Secondary | ICD-10-CM | POA: Diagnosis not present

## 2022-01-19 DIAGNOSIS — G894 Chronic pain syndrome: Secondary | ICD-10-CM | POA: Diagnosis not present

## 2022-01-23 DIAGNOSIS — I10 Essential (primary) hypertension: Secondary | ICD-10-CM | POA: Diagnosis not present

## 2022-01-23 DIAGNOSIS — E1165 Type 2 diabetes mellitus with hyperglycemia: Secondary | ICD-10-CM | POA: Diagnosis not present

## 2022-01-30 DIAGNOSIS — E119 Type 2 diabetes mellitus without complications: Secondary | ICD-10-CM | POA: Diagnosis not present

## 2022-02-14 DIAGNOSIS — Z79891 Long term (current) use of opiate analgesic: Secondary | ICD-10-CM | POA: Diagnosis not present

## 2022-02-14 DIAGNOSIS — M545 Low back pain, unspecified: Secondary | ICD-10-CM | POA: Diagnosis not present

## 2022-02-14 DIAGNOSIS — G894 Chronic pain syndrome: Secondary | ICD-10-CM | POA: Diagnosis not present

## 2022-02-14 DIAGNOSIS — Z79899 Other long term (current) drug therapy: Secondary | ICD-10-CM | POA: Diagnosis not present

## 2022-02-22 DIAGNOSIS — E1165 Type 2 diabetes mellitus with hyperglycemia: Secondary | ICD-10-CM | POA: Diagnosis not present

## 2022-02-22 DIAGNOSIS — I10 Essential (primary) hypertension: Secondary | ICD-10-CM | POA: Diagnosis not present

## 2022-03-14 DIAGNOSIS — M545 Low back pain, unspecified: Secondary | ICD-10-CM | POA: Diagnosis not present

## 2022-03-14 DIAGNOSIS — G894 Chronic pain syndrome: Secondary | ICD-10-CM | POA: Diagnosis not present

## 2022-03-24 DIAGNOSIS — E1165 Type 2 diabetes mellitus with hyperglycemia: Secondary | ICD-10-CM | POA: Diagnosis not present

## 2022-03-24 DIAGNOSIS — I10 Essential (primary) hypertension: Secondary | ICD-10-CM | POA: Diagnosis not present

## 2022-04-13 DIAGNOSIS — D692 Other nonthrombocytopenic purpura: Secondary | ICD-10-CM | POA: Diagnosis not present

## 2022-04-13 DIAGNOSIS — N1831 Chronic kidney disease, stage 3a: Secondary | ICD-10-CM | POA: Diagnosis not present

## 2022-04-13 DIAGNOSIS — E1159 Type 2 diabetes mellitus with other circulatory complications: Secondary | ICD-10-CM | POA: Diagnosis not present

## 2022-04-13 DIAGNOSIS — D696 Thrombocytopenia, unspecified: Secondary | ICD-10-CM | POA: Diagnosis not present

## 2022-04-13 DIAGNOSIS — I700 Atherosclerosis of aorta: Secondary | ICD-10-CM | POA: Diagnosis not present

## 2022-04-13 DIAGNOSIS — I10 Essential (primary) hypertension: Secondary | ICD-10-CM | POA: Diagnosis not present

## 2022-04-13 DIAGNOSIS — I152 Hypertension secondary to endocrine disorders: Secondary | ICD-10-CM | POA: Diagnosis not present

## 2022-04-13 DIAGNOSIS — I509 Heart failure, unspecified: Secondary | ICD-10-CM | POA: Diagnosis not present

## 2022-04-13 DIAGNOSIS — E1151 Type 2 diabetes mellitus with diabetic peripheral angiopathy without gangrene: Secondary | ICD-10-CM | POA: Diagnosis not present

## 2022-04-13 DIAGNOSIS — E1122 Type 2 diabetes mellitus with diabetic chronic kidney disease: Secondary | ICD-10-CM | POA: Diagnosis not present

## 2022-04-13 DIAGNOSIS — M069 Rheumatoid arthritis, unspecified: Secondary | ICD-10-CM | POA: Diagnosis not present

## 2022-04-13 DIAGNOSIS — Z299 Encounter for prophylactic measures, unspecified: Secondary | ICD-10-CM | POA: Diagnosis not present

## 2022-04-19 DIAGNOSIS — G894 Chronic pain syndrome: Secondary | ICD-10-CM | POA: Diagnosis not present

## 2022-04-19 DIAGNOSIS — M545 Low back pain, unspecified: Secondary | ICD-10-CM | POA: Diagnosis not present

## 2022-04-24 DIAGNOSIS — I10 Essential (primary) hypertension: Secondary | ICD-10-CM | POA: Diagnosis not present

## 2022-04-24 DIAGNOSIS — E1165 Type 2 diabetes mellitus with hyperglycemia: Secondary | ICD-10-CM | POA: Diagnosis not present

## 2022-05-17 DIAGNOSIS — M545 Low back pain, unspecified: Secondary | ICD-10-CM | POA: Diagnosis not present

## 2022-05-17 DIAGNOSIS — G894 Chronic pain syndrome: Secondary | ICD-10-CM | POA: Diagnosis not present

## 2022-05-24 DIAGNOSIS — E1165 Type 2 diabetes mellitus with hyperglycemia: Secondary | ICD-10-CM | POA: Diagnosis not present

## 2022-05-24 DIAGNOSIS — I10 Essential (primary) hypertension: Secondary | ICD-10-CM | POA: Diagnosis not present

## 2022-05-25 ENCOUNTER — Ambulatory Visit: Payer: Medicare HMO | Attending: Internal Medicine | Admitting: Internal Medicine

## 2022-05-25 ENCOUNTER — Encounter: Payer: Self-pay | Admitting: Internal Medicine

## 2022-05-25 VITALS — BP 142/78 | HR 63 | Ht 73.0 in | Wt 225.0 lb

## 2022-05-25 DIAGNOSIS — I5022 Chronic systolic (congestive) heart failure: Secondary | ICD-10-CM

## 2022-05-25 DIAGNOSIS — I425 Other restrictive cardiomyopathy: Secondary | ICD-10-CM | POA: Insufficient documentation

## 2022-05-25 DIAGNOSIS — Z95810 Presence of automatic (implantable) cardiac defibrillator: Secondary | ICD-10-CM

## 2022-05-25 NOTE — Progress Notes (Signed)
HPI Mr. Pinell returns today for ICD followup. He is a pleasant 72yo man with chronic systolic heart failure, ICM, s/p ICD who has done well since his new device was placed. In the interim, he has done well with no chest pain or sob. No syncope. He walks regularly. He notes that at home is blood pressure has been increased some. He reached ERI on his ICD almost a year ago. However his EF has normalized and he started walking and he is walking 5 miles most days and he has class 1 symptoms with no angina.  No Known Allergies   Current Outpatient Medications  Medication Sig Dispense Refill   acetaminophen (TYLENOL) 500 MG tablet Take 1,000 mg by mouth 2 (two) times daily as needed for headache.     carvedilol (COREG) 12.5 MG tablet TAKE 1 AND 1/2 TABLETS BY MOUTH TWICE DAILY 270 tablet 3   diclofenac Sodium (VOLTAREN) 1 % GEL Apply 4 g topically daily as needed for pain.     furosemide (LASIX) 20 MG tablet Take 20 mg by mouth daily as needed for fluid.     gabapentin (NEURONTIN) 600 MG tablet Take 600 mg by mouth 4 (four) times daily.  2   HYDROcodone-acetaminophen (NORCO) 10-325 MG tablet Take 1 tablet by mouth in the morning, at noon, in the evening, and at bedtime.     losartan (COZAAR) 100 MG tablet Take 100 mg by mouth daily.     metFORMIN (GLUCOPHAGE-XR) 500 MG 24 hr tablet Take 500 mg by mouth 2 (two) times daily.      NARCAN 4 MG/0.1ML LIQD nasal spray kit Place 1 spray into the nose daily as needed (Overdose reverse).     QC LO-DOSE ASPIRIN 81 MG EC tablet Take 81 mg by mouth daily.  12   tamsulosin (FLOMAX) 0.4 MG CAPS capsule Take 0.4 mg by mouth daily after breakfast.      pravastatin (PRAVACHOL) 40 MG tablet Take 40 mg by mouth daily.      No current facility-administered medications for this visit.     Past Medical History:  Diagnosis Date   AICD (automatic cardioverter/defibrillator) present 08/02/2016   Arthritis    " In my hands , back, feet " (12/20/2016)   Back  pain    Recent injection with aspirin and Plavix held, patient had chest pain that day   Blood glucose elevated    March, 2012   CAD (coronary artery disease)     Chest pain while off Plavix, March, 2012, catheterization, LAD stents patent, worsening third diagonal 80%-use PCI only for recurrent significant symptoms /  ..NSTEMI..2007two DES LAD (kissing balloon)   CAD (coronary artery disease)    plavix indefinitly   CAD (coronary artery disease)    cardiac clearance .[name].for back surgery...october 2011   Chronic systolic heart failure ([place])    Dyslipidemia    Gout    April, 201 to   Hypertension    EF 50-55%, borderline anterior hypokinesis, catheterization March, 2012   SOB (shortness of breath)    Type II diabetes mellitus ([place])     ROS:   All systems reviewed and negative except as noted in the HPI.   Past Surgical History:  Procedure Laterality Date   BACK SURGERY     CARDIAC CATHETERIZATION  08/2005; 05/2010   [name] 08/09/2010; notes 05/29/2010   CARDIAC DEFIBRILLATOR PLACEMENT  12/20/2016   CORONARY ANGIOPLASTY WITH STENT PLACEMENT  08/2005   [name] 08/09/2010  CORONARY ARTERY BYPASS GRAFT  05/2015   CABG X5   ICD GENERATOR REMOVAL N/A 10/12/2016   Procedure: ICD Generator Removal;  Surgeon: Evans Lance, MD;  Location: [place] [facility];  Service: Cardiovascular;  Laterality: N/A;   ICD IMPLANT N/A 08/02/2016   Procedure: ICD Implant;  Surgeon: Evans Lance, MD;  Location: [place] [facility];  Service: Cardiovascular;  Laterality: N/A;   ICD IMPLANT N/A 12/20/2016   Procedure: ICD Implant;  Surgeon: Evans Lance, MD;  Location: [place] [facility];  Service: Cardiovascular;  Laterality: N/A;   ICD LEAD REMOVAL  10/12/2016   Procedure: Icd Lead Removal;  Surgeon: Evans Lance, MD;  Location: [place] [facility];  Service: Cardiovascular;;   LUMBAR DISC SURGERY  X 3     Family History  Problem Relation Age of Onset   Coronary artery disease Father       Social History   Socioeconomic History   Marital status: Divorced    Spouse name: Not on file   Number of children: Not on file   Years of education: Not on file   Highest education level: Not on file  Occupational History   Occupation: Engineer, manufacturing systems    Employer: [employer]  Tobacco Use   Smoking status: Former    Packs/day: 2.00    Years: 15.00    Total pack years: 30.00    Types: Cigarettes    Quit date: 09/24/1973    Years since quitting: 48.6   Smokeless tobacco: Current    Types: Snuff   Tobacco comments:    12/20/2016 "I have been dipping snuff since I was ~  72 years old"  Vaping Use   Vaping Use: Never used  Substance and Sexual Activity   Alcohol use: No   Drug use: No   Sexual activity: Never  Other Topics Concern   Not on file  Social History Narrative   Not on file   Social Determinants of Health   Financial Resource Strain: Not on file  Food Insecurity: Not on file  Transportation Needs: Not on file  Physical Activity: Not on file  Stress: Not on file  Social Connections: Not on file  Intimate Partner Violence: Not on file     BP (!) 142/78   Pulse 63   Ht '6\' 1"'$  (1.854 m)   Wt 225 lb (102.1 kg)   SpO2 99%   BMI 29.69 kg/m   Physical Exam:  Well appearing NAD HEENT: Unremarkable Neck:  No JVD, no thyromegally Lymphatics:  No adenopathy Back:  No CVA tenderness Lungs:  Clear with no wheezes HEART:  Regular rate rhythm, no murmurs, no rubs, no clicks Abd:  soft, positive bowel sounds, no organomegally, no rebound, no guarding Ext:  2 plus pulses, no edema, no cyanosis, no clubbing Skin:  No rashes no nodules Neuro:  CN II through XII intact, motor grossly intact  EKG - nsr with pvc's    Assess/Plan: ICM - he has had normalization of his LV function and denies anginal symptoms.  ICD - his device battery is depleted. We have discussed the treatment options and I do not recommend ICD insertion as his EF has normalized. HTN  -his bp is mostly controlled.   Carleene Overlie Haadi Santellan,MD

## 2022-05-25 NOTE — Patient Instructions (Addendum)
Medication Instructions:  Your physician recommends that you continue on your current medications as directed. Please refer to the Current Medication list given to you today.  *If you need a refill on your cardiac medications before your next appointment, please call your pharmacy*  Lab Work: None ordered.  If you have labs (blood work) drawn today and your tests are completely normal, you will receive your results only by: [place] (if you have MyChart) OR A paper copy in the mail If you have any lab test that is abnormal or we need to change your treatment, we will call you to review the results.  Testing/Procedures: None ordered.  Follow-Up: At [facility], you and your health needs are our priority.  As part of our continuing mission to provide you with exceptional heart care, we have created designated Provider Care Teams.  These Care Teams include your primary Cardiologist (physician) and Advanced Practice Providers (APPs -  Physician Assistants and Nurse Practitioners) who all work together to provide you with the care you need, when you need it.  We recommend signing up for the patient portal called "MyChart".  Sign up information is provided on this After Visit Summary.  MyChart is used to connect with patients for Virtual Visits (Telemedicine).  Patients are able to view lab/test results, encounter notes, upcoming appointments, etc.  Non-urgent messages can be sent to your provider as well.   To learn more about what you can do with MyChart, go to [email or web address].    Your next appointment:   AS NEEDED  The format for your next appointment:   In Person  Provider:   Cristopher Peru, MD{or one of the following Advanced Practice Providers on your designated Care Team:   Tommye Standard, [place] Legrand Como "[facility]" [place], [place]

## 2022-06-22 DIAGNOSIS — G894 Chronic pain syndrome: Secondary | ICD-10-CM | POA: Diagnosis not present

## 2022-06-22 DIAGNOSIS — M545 Low back pain, unspecified: Secondary | ICD-10-CM | POA: Diagnosis not present

## 2022-06-24 DIAGNOSIS — E1165 Type 2 diabetes mellitus with hyperglycemia: Secondary | ICD-10-CM | POA: Diagnosis not present

## 2022-06-24 DIAGNOSIS — I10 Essential (primary) hypertension: Secondary | ICD-10-CM | POA: Diagnosis not present

## 2022-07-20 DIAGNOSIS — M545 Low back pain, unspecified: Secondary | ICD-10-CM | POA: Diagnosis not present

## 2022-07-20 DIAGNOSIS — G894 Chronic pain syndrome: Secondary | ICD-10-CM | POA: Diagnosis not present

## 2022-07-25 DIAGNOSIS — E1165 Type 2 diabetes mellitus with hyperglycemia: Secondary | ICD-10-CM | POA: Diagnosis not present

## 2022-07-25 DIAGNOSIS — I10 Essential (primary) hypertension: Secondary | ICD-10-CM | POA: Diagnosis not present

## 2022-08-07 DIAGNOSIS — I425 Other restrictive cardiomyopathy: Secondary | ICD-10-CM | POA: Diagnosis not present

## 2022-08-07 DIAGNOSIS — I5022 Chronic systolic (congestive) heart failure: Secondary | ICD-10-CM | POA: Diagnosis not present

## 2022-08-07 DIAGNOSIS — I10 Essential (primary) hypertension: Secondary | ICD-10-CM | POA: Diagnosis not present

## 2022-08-07 DIAGNOSIS — E1159 Type 2 diabetes mellitus with other circulatory complications: Secondary | ICD-10-CM | POA: Diagnosis not present

## 2022-08-07 DIAGNOSIS — Z299 Encounter for prophylactic measures, unspecified: Secondary | ICD-10-CM | POA: Diagnosis not present

## 2022-08-07 DIAGNOSIS — I152 Hypertension secondary to endocrine disorders: Secondary | ICD-10-CM | POA: Diagnosis not present

## 2022-08-23 DIAGNOSIS — Z79899 Other long term (current) drug therapy: Secondary | ICD-10-CM | POA: Diagnosis not present

## 2022-08-23 DIAGNOSIS — Z79891 Long term (current) use of opiate analgesic: Secondary | ICD-10-CM | POA: Diagnosis not present

## 2022-08-23 DIAGNOSIS — M545 Low back pain, unspecified: Secondary | ICD-10-CM | POA: Diagnosis not present

## 2022-08-23 DIAGNOSIS — G894 Chronic pain syndrome: Secondary | ICD-10-CM | POA: Diagnosis not present

## 2022-08-25 DIAGNOSIS — E1165 Type 2 diabetes mellitus with hyperglycemia: Secondary | ICD-10-CM | POA: Diagnosis not present

## 2022-08-25 DIAGNOSIS — I10 Essential (primary) hypertension: Secondary | ICD-10-CM | POA: Diagnosis not present

## 2022-09-19 ENCOUNTER — Other Ambulatory Visit: Payer: Self-pay | Admitting: Internal Medicine

## 2022-09-21 DIAGNOSIS — G894 Chronic pain syndrome: Secondary | ICD-10-CM | POA: Diagnosis not present

## 2022-09-21 DIAGNOSIS — M545 Low back pain, unspecified: Secondary | ICD-10-CM | POA: Diagnosis not present

## 2022-09-24 DIAGNOSIS — E1165 Type 2 diabetes mellitus with hyperglycemia: Secondary | ICD-10-CM | POA: Diagnosis not present

## 2022-09-24 DIAGNOSIS — I10 Essential (primary) hypertension: Secondary | ICD-10-CM | POA: Diagnosis not present

## 2022-10-06 DIAGNOSIS — Z1339 Encounter for screening examination for other mental health and behavioral disorders: Secondary | ICD-10-CM | POA: Diagnosis not present

## 2022-10-06 DIAGNOSIS — Z0000 Encounter for general adult medical examination without abnormal findings: Secondary | ICD-10-CM | POA: Diagnosis not present

## 2022-10-06 DIAGNOSIS — I700 Atherosclerosis of aorta: Secondary | ICD-10-CM | POA: Diagnosis not present

## 2022-10-06 DIAGNOSIS — Z79899 Other long term (current) drug therapy: Secondary | ICD-10-CM | POA: Diagnosis not present

## 2022-10-06 DIAGNOSIS — Z125 Encounter for screening for malignant neoplasm of prostate: Secondary | ICD-10-CM | POA: Diagnosis not present

## 2022-10-06 DIAGNOSIS — Z1331 Encounter for screening for depression: Secondary | ICD-10-CM | POA: Diagnosis not present

## 2022-10-06 DIAGNOSIS — I509 Heart failure, unspecified: Secondary | ICD-10-CM | POA: Diagnosis not present

## 2022-10-06 DIAGNOSIS — I10 Essential (primary) hypertension: Secondary | ICD-10-CM | POA: Diagnosis not present

## 2022-10-06 DIAGNOSIS — R5383 Other fatigue: Secondary | ICD-10-CM | POA: Diagnosis not present

## 2022-10-06 DIAGNOSIS — I739 Peripheral vascular disease, unspecified: Secondary | ICD-10-CM | POA: Diagnosis not present

## 2022-10-06 DIAGNOSIS — Z299 Encounter for prophylactic measures, unspecified: Secondary | ICD-10-CM | POA: Diagnosis not present

## 2022-10-06 DIAGNOSIS — E7800 Pure hypercholesterolemia, unspecified: Secondary | ICD-10-CM | POA: Diagnosis not present

## 2022-10-06 DIAGNOSIS — Z7189 Other specified counseling: Secondary | ICD-10-CM | POA: Diagnosis not present

## 2022-10-11 DIAGNOSIS — R42 Dizziness and giddiness: Secondary | ICD-10-CM | POA: Diagnosis not present

## 2022-10-11 DIAGNOSIS — N400 Enlarged prostate without lower urinary tract symptoms: Secondary | ICD-10-CM | POA: Diagnosis not present

## 2022-10-11 DIAGNOSIS — I10 Essential (primary) hypertension: Secondary | ICD-10-CM | POA: Diagnosis not present

## 2022-10-11 DIAGNOSIS — I5022 Chronic systolic (congestive) heart failure: Secondary | ICD-10-CM | POA: Diagnosis not present

## 2022-10-11 DIAGNOSIS — Z299 Encounter for prophylactic measures, unspecified: Secondary | ICD-10-CM | POA: Diagnosis not present

## 2022-10-20 DIAGNOSIS — H524 Presbyopia: Secondary | ICD-10-CM | POA: Diagnosis not present

## 2022-10-23 DIAGNOSIS — G894 Chronic pain syndrome: Secondary | ICD-10-CM | POA: Diagnosis not present

## 2022-10-23 DIAGNOSIS — M545 Low back pain, unspecified: Secondary | ICD-10-CM | POA: Diagnosis not present

## 2022-10-25 DIAGNOSIS — I10 Essential (primary) hypertension: Secondary | ICD-10-CM | POA: Diagnosis not present

## 2022-10-25 DIAGNOSIS — I509 Heart failure, unspecified: Secondary | ICD-10-CM | POA: Diagnosis not present

## 2022-10-25 DIAGNOSIS — R5383 Other fatigue: Secondary | ICD-10-CM | POA: Diagnosis not present

## 2022-10-25 DIAGNOSIS — E1165 Type 2 diabetes mellitus with hyperglycemia: Secondary | ICD-10-CM | POA: Diagnosis not present

## 2022-10-25 DIAGNOSIS — Z299 Encounter for prophylactic measures, unspecified: Secondary | ICD-10-CM | POA: Diagnosis not present

## 2022-10-30 DIAGNOSIS — Z0100 Encounter for examination of eyes and vision without abnormal findings: Secondary | ICD-10-CM | POA: Diagnosis not present

## 2022-10-31 DIAGNOSIS — R972 Elevated prostate specific antigen [PSA]: Secondary | ICD-10-CM | POA: Diagnosis not present

## 2022-10-31 DIAGNOSIS — N401 Enlarged prostate with lower urinary tract symptoms: Secondary | ICD-10-CM | POA: Diagnosis not present

## 2022-10-31 DIAGNOSIS — R3915 Urgency of urination: Secondary | ICD-10-CM | POA: Diagnosis not present

## 2022-11-25 DIAGNOSIS — I10 Essential (primary) hypertension: Secondary | ICD-10-CM | POA: Diagnosis not present

## 2022-11-25 DIAGNOSIS — E1165 Type 2 diabetes mellitus with hyperglycemia: Secondary | ICD-10-CM | POA: Diagnosis not present

## 2022-11-28 DIAGNOSIS — G894 Chronic pain syndrome: Secondary | ICD-10-CM | POA: Diagnosis not present

## 2022-11-28 DIAGNOSIS — M545 Low back pain, unspecified: Secondary | ICD-10-CM | POA: Diagnosis not present

## 2022-12-05 DIAGNOSIS — Z0000 Encounter for general adult medical examination without abnormal findings: Secondary | ICD-10-CM | POA: Diagnosis not present

## 2022-12-05 DIAGNOSIS — I739 Peripheral vascular disease, unspecified: Secondary | ICD-10-CM | POA: Diagnosis not present

## 2022-12-05 DIAGNOSIS — I700 Atherosclerosis of aorta: Secondary | ICD-10-CM | POA: Diagnosis not present

## 2022-12-05 DIAGNOSIS — I10 Essential (primary) hypertension: Secondary | ICD-10-CM | POA: Diagnosis not present

## 2022-12-05 DIAGNOSIS — I152 Hypertension secondary to endocrine disorders: Secondary | ICD-10-CM | POA: Diagnosis not present

## 2022-12-05 DIAGNOSIS — E1159 Type 2 diabetes mellitus with other circulatory complications: Secondary | ICD-10-CM | POA: Diagnosis not present

## 2022-12-05 DIAGNOSIS — Z299 Encounter for prophylactic measures, unspecified: Secondary | ICD-10-CM | POA: Diagnosis not present

## 2022-12-05 DIAGNOSIS — I509 Heart failure, unspecified: Secondary | ICD-10-CM | POA: Diagnosis not present

## 2022-12-06 DIAGNOSIS — N401 Enlarged prostate with lower urinary tract symptoms: Secondary | ICD-10-CM | POA: Diagnosis not present

## 2022-12-06 DIAGNOSIS — R3915 Urgency of urination: Secondary | ICD-10-CM | POA: Diagnosis not present

## 2022-12-25 DIAGNOSIS — I10 Essential (primary) hypertension: Secondary | ICD-10-CM | POA: Diagnosis not present

## 2022-12-25 DIAGNOSIS — E1165 Type 2 diabetes mellitus with hyperglycemia: Secondary | ICD-10-CM | POA: Diagnosis not present

## 2022-12-27 DIAGNOSIS — G894 Chronic pain syndrome: Secondary | ICD-10-CM | POA: Diagnosis not present

## 2022-12-27 DIAGNOSIS — M545 Low back pain, unspecified: Secondary | ICD-10-CM | POA: Diagnosis not present

## 2023-01-03 DIAGNOSIS — R972 Elevated prostate specific antigen [PSA]: Secondary | ICD-10-CM | POA: Diagnosis not present

## 2023-01-24 DIAGNOSIS — M545 Low back pain, unspecified: Secondary | ICD-10-CM | POA: Diagnosis not present

## 2023-01-24 DIAGNOSIS — E1165 Type 2 diabetes mellitus with hyperglycemia: Secondary | ICD-10-CM | POA: Diagnosis not present

## 2023-01-24 DIAGNOSIS — G894 Chronic pain syndrome: Secondary | ICD-10-CM | POA: Diagnosis not present

## 2023-01-24 DIAGNOSIS — I10 Essential (primary) hypertension: Secondary | ICD-10-CM | POA: Diagnosis not present

## 2023-02-18 ENCOUNTER — Encounter (HOSPITAL_COMMUNITY): Payer: Self-pay | Admitting: *Deleted

## 2023-02-18 ENCOUNTER — Other Ambulatory Visit: Payer: Self-pay

## 2023-02-18 ENCOUNTER — Emergency Department (HOSPITAL_COMMUNITY): Payer: Medicare HMO

## 2023-02-18 ENCOUNTER — Inpatient Hospital Stay (HOSPITAL_COMMUNITY)
Admission: EM | Admit: 2023-02-18 | Discharge: 2023-02-22 | DRG: 291 | Disposition: A | Discharge status: Home Health | Payer: Medicare HMO | Attending: Internal Medicine | Admitting: Internal Medicine

## 2023-02-18 DIAGNOSIS — E1122 Type 2 diabetes mellitus with diabetic chronic kidney disease: Secondary | ICD-10-CM | POA: Diagnosis not present

## 2023-02-18 DIAGNOSIS — Z7901 Long term (current) use of anticoagulants: Secondary | ICD-10-CM | POA: Diagnosis not present

## 2023-02-18 DIAGNOSIS — R296 Repeated falls: Secondary | ICD-10-CM | POA: Diagnosis not present

## 2023-02-18 DIAGNOSIS — D71 Functional disorders of polymorphonuclear neutrophils: Secondary | ICD-10-CM | POA: Diagnosis not present

## 2023-02-18 DIAGNOSIS — N183 Chronic kidney disease, stage 3 unspecified: Secondary | ICD-10-CM | POA: Diagnosis present

## 2023-02-18 DIAGNOSIS — I252 Old myocardial infarction: Secondary | ICD-10-CM

## 2023-02-18 DIAGNOSIS — S199XXA Unspecified injury of neck, initial encounter: Secondary | ICD-10-CM | POA: Diagnosis not present

## 2023-02-18 DIAGNOSIS — Z23 Encounter for immunization: Secondary | ICD-10-CM

## 2023-02-18 DIAGNOSIS — E876 Hypokalemia: Secondary | ICD-10-CM | POA: Diagnosis present

## 2023-02-18 DIAGNOSIS — E119 Type 2 diabetes mellitus without complications: Secondary | ICD-10-CM | POA: Diagnosis present

## 2023-02-18 DIAGNOSIS — I10 Essential (primary) hypertension: Secondary | ICD-10-CM | POA: Diagnosis not present

## 2023-02-18 DIAGNOSIS — W1830XA Fall on same level, unspecified, initial encounter: Secondary | ICD-10-CM | POA: Diagnosis present

## 2023-02-18 DIAGNOSIS — E785 Hyperlipidemia, unspecified: Secondary | ICD-10-CM | POA: Diagnosis present

## 2023-02-18 DIAGNOSIS — I5023 Acute on chronic systolic (congestive) heart failure: Secondary | ICD-10-CM

## 2023-02-18 DIAGNOSIS — R220 Localized swelling, mass and lump, head: Secondary | ICD-10-CM | POA: Diagnosis not present

## 2023-02-18 DIAGNOSIS — Y9301 Activity, walking, marching and hiking: Secondary | ICD-10-CM | POA: Diagnosis present

## 2023-02-18 DIAGNOSIS — Z8249 Family history of ischemic heart disease and other diseases of the circulatory system: Secondary | ICD-10-CM

## 2023-02-18 DIAGNOSIS — I110 Hypertensive heart disease with heart failure: Secondary | ICD-10-CM | POA: Diagnosis not present

## 2023-02-18 DIAGNOSIS — E860 Dehydration: Secondary | ICD-10-CM | POA: Diagnosis present

## 2023-02-18 DIAGNOSIS — Z4502 Encounter for adjustment and management of automatic implantable cardiac defibrillator: Secondary | ICD-10-CM | POA: Diagnosis not present

## 2023-02-18 DIAGNOSIS — J9811 Atelectasis: Secondary | ICD-10-CM | POA: Diagnosis not present

## 2023-02-18 DIAGNOSIS — I5032 Chronic diastolic (congestive) heart failure: Secondary | ICD-10-CM | POA: Diagnosis not present

## 2023-02-18 DIAGNOSIS — T3695XA Adverse effect of unspecified systemic antibiotic, initial encounter: Secondary | ICD-10-CM | POA: Diagnosis not present

## 2023-02-18 DIAGNOSIS — R55 Syncope and collapse: Secondary | ICD-10-CM | POA: Diagnosis not present

## 2023-02-18 DIAGNOSIS — N1831 Chronic kidney disease, stage 3a: Secondary | ICD-10-CM

## 2023-02-18 DIAGNOSIS — R197 Diarrhea, unspecified: Secondary | ICD-10-CM | POA: Diagnosis not present

## 2023-02-18 DIAGNOSIS — Z79899 Other long term (current) drug therapy: Secondary | ICD-10-CM | POA: Diagnosis not present

## 2023-02-18 DIAGNOSIS — M159 Polyosteoarthritis, unspecified: Secondary | ICD-10-CM | POA: Diagnosis present

## 2023-02-18 DIAGNOSIS — E041 Nontoxic single thyroid nodule: Secondary | ICD-10-CM | POA: Diagnosis not present

## 2023-02-18 DIAGNOSIS — I951 Orthostatic hypotension: Secondary | ICD-10-CM | POA: Diagnosis present

## 2023-02-18 DIAGNOSIS — I425 Other restrictive cardiomyopathy: Secondary | ICD-10-CM | POA: Diagnosis not present

## 2023-02-18 DIAGNOSIS — Z720 Tobacco use: Secondary | ICD-10-CM

## 2023-02-18 DIAGNOSIS — N179 Acute kidney failure, unspecified: Secondary | ICD-10-CM | POA: Diagnosis present

## 2023-02-18 DIAGNOSIS — M109 Gout, unspecified: Secondary | ICD-10-CM | POA: Diagnosis present

## 2023-02-18 DIAGNOSIS — J189 Pneumonia, unspecified organism: Secondary | ICD-10-CM | POA: Diagnosis present

## 2023-02-18 DIAGNOSIS — R0602 Shortness of breath: Secondary | ICD-10-CM | POA: Diagnosis not present

## 2023-02-18 DIAGNOSIS — I2510 Atherosclerotic heart disease of native coronary artery without angina pectoris: Secondary | ICD-10-CM | POA: Diagnosis present

## 2023-02-18 DIAGNOSIS — Z955 Presence of coronary angioplasty implant and graft: Secondary | ICD-10-CM

## 2023-02-18 DIAGNOSIS — Z7984 Long term (current) use of oral hypoglycemic drugs: Secondary | ICD-10-CM

## 2023-02-18 DIAGNOSIS — R42 Dizziness and giddiness: Secondary | ICD-10-CM

## 2023-02-18 DIAGNOSIS — I472 Ventricular tachycardia, unspecified: Secondary | ICD-10-CM | POA: Diagnosis not present

## 2023-02-18 DIAGNOSIS — I255 Ischemic cardiomyopathy: Secondary | ICD-10-CM | POA: Diagnosis not present

## 2023-02-18 DIAGNOSIS — N400 Enlarged prostate without lower urinary tract symptoms: Secondary | ICD-10-CM | POA: Diagnosis present

## 2023-02-18 DIAGNOSIS — Z951 Presence of aortocoronary bypass graft: Secondary | ICD-10-CM | POA: Diagnosis not present

## 2023-02-18 DIAGNOSIS — R918 Other nonspecific abnormal finding of lung field: Secondary | ICD-10-CM | POA: Diagnosis not present

## 2023-02-18 DIAGNOSIS — S0990XA Unspecified injury of head, initial encounter: Secondary | ICD-10-CM | POA: Diagnosis not present

## 2023-02-18 DIAGNOSIS — I5043 Acute on chronic combined systolic (congestive) and diastolic (congestive) heart failure: Secondary | ICD-10-CM | POA: Diagnosis present

## 2023-02-18 DIAGNOSIS — I509 Heart failure, unspecified: Principal | ICD-10-CM

## 2023-02-18 DIAGNOSIS — Z95810 Presence of automatic (implantable) cardiac defibrillator: Secondary | ICD-10-CM | POA: Diagnosis present

## 2023-02-18 DIAGNOSIS — Z79891 Long term (current) use of opiate analgesic: Secondary | ICD-10-CM

## 2023-02-18 DIAGNOSIS — Z7982 Long term (current) use of aspirin: Secondary | ICD-10-CM

## 2023-02-18 HISTORY — DX: Chronic diastolic (congestive) heart failure: I50.32

## 2023-02-18 HISTORY — DX: Unspecified systolic (congestive) heart failure: I50.20

## 2023-02-18 HISTORY — DX: Ischemic cardiomyopathy: I25.5

## 2023-02-18 HISTORY — DX: Chronic kidney disease, stage 3 unspecified: N18.30

## 2023-02-18 LAB — COMPREHENSIVE METABOLIC PANEL
ALT: 15 U/L (ref 0–44)
AST: 24 U/L (ref 15–41)
Albumin: 3.7 g/dL (ref 3.5–5.0)
Alkaline Phosphatase: 47 U/L (ref 38–126)
Anion gap: 15 (ref 5–15)
BUN: 32 mg/dL — ABNORMAL HIGH (ref 8–23)
CO2: 20 mmol/L — ABNORMAL LOW (ref 22–32)
Calcium: 9.4 mg/dL (ref 8.9–10.3)
Chloride: 105 mmol/L (ref 98–111)
Creatinine, Ser: 1.3 mg/dL — ABNORMAL HIGH (ref 0.61–1.24)
GFR, Estimated: 59 mL/min — ABNORMAL LOW (ref >60)
Glucose, Bld: 266 mg/dL — ABNORMAL HIGH (ref 70–99)
Potassium: 3.5 mmol/L (ref 3.5–5.1)
Sodium: 140 mmol/L (ref 135–145)
Total Bilirubin: 1.9 mg/dL — ABNORMAL HIGH (ref <1.2)
Total Protein: 7.8 g/dL (ref 6.5–8.1)

## 2023-02-18 LAB — TROPONIN I (HIGH SENSITIVITY)
Troponin I (High Sensitivity): 42 ng/L — ABNORMAL HIGH (ref <18)
Troponin I (High Sensitivity): 40 ng/L — ABNORMAL HIGH (ref <18)

## 2023-02-18 LAB — CBC WITH DIFFERENTIAL/PLATELET
Abs Immature Granulocytes: 0.09 10*3/uL — ABNORMAL HIGH (ref 0.00–0.07)
Basophils Absolute: 0 10*3/uL (ref 0.0–0.1)
Basophils Relative: 0 %
Eosinophils Absolute: 0 10*3/uL (ref 0.0–0.5)
Eosinophils Relative: 0 %
HCT: 42.6 % (ref 39.0–52.0)
Hemoglobin: 14.4 g/dL (ref 13.0–17.0)
Immature Granulocytes: 1 %
Lymphocytes Relative: 6 %
Lymphs Abs: 0.8 10*3/uL (ref 0.7–4.0)
MCH: 28.1 pg (ref 26.0–34.0)
MCHC: 33.8 g/dL (ref 30.0–36.0)
MCV: 83.2 fL (ref 80.0–100.0)
Monocytes Absolute: 0.4 10*3/uL (ref 0.1–1.0)
Monocytes Relative: 3 %
Neutro Abs: 10.8 10*3/uL — ABNORMAL HIGH (ref 1.7–7.7)
Neutrophils Relative %: 90 %
Platelets: 149 10*3/uL — ABNORMAL LOW (ref 150–400)
RBC: 5.12 MIL/uL (ref 4.22–5.81)
RDW: 13.6 % (ref 11.5–15.5)
WBC: 12.1 10*3/uL — ABNORMAL HIGH (ref 4.0–10.5)
nRBC: 0 % (ref 0.0–0.2)

## 2023-02-18 LAB — BRAIN NATRIURETIC PEPTIDE: B Natriuretic Peptide: 756 pg/mL — ABNORMAL HIGH (ref 0.0–100.0)

## 2023-02-18 LAB — CBG MONITORING, ED: Glucose-Capillary: 277 mg/dL — ABNORMAL HIGH (ref 70–99)

## 2023-02-18 MED ORDER — ASPIRIN 81 MG PO TBEC
81.0000 mg | DELAYED_RELEASE_TABLET | Freq: Every day | ORAL | Status: DC
  Administered 2023-02-19 – 2023-02-22 (×4): 81 mg via ORAL
  Filled 2023-02-18 (×4): qty 1

## 2023-02-18 MED ORDER — INSULIN ASPART 100 UNIT/ML IJ SOLN
0.0000 [IU] | Freq: Every day | INTRAMUSCULAR | Status: DC
  Administered 2023-02-18: 3 [IU] via SUBCUTANEOUS

## 2023-02-18 MED ORDER — ACETAMINOPHEN 325 MG PO TABS: 650.0000 mg | ORAL_TABLET | ORAL | Status: DC | PRN

## 2023-02-18 MED ORDER — FUROSEMIDE 10 MG/ML IJ SOLN
40.0000 mg | Freq: Every day | INTRAMUSCULAR | Status: DC
  Administered 2023-02-19: 40 mg via INTRAVENOUS
  Filled 2023-02-18: qty 4

## 2023-02-18 MED ORDER — SODIUM CHLORIDE 0.9% FLUSH: 3.0000 mL | INTRAVENOUS | Status: DC | PRN

## 2023-02-18 MED ORDER — LOSARTAN POTASSIUM 50 MG PO TABS
50.0000 mg | ORAL_TABLET | Freq: Every day | ORAL | Status: DC
  Administered 2023-02-19 – 2023-02-22 (×4): 50 mg via ORAL
  Filled 2023-02-18 (×2): qty 1
  Filled 2023-02-18: qty 2
  Filled 2023-02-18: qty 1

## 2023-02-18 MED ORDER — SODIUM CHLORIDE 0.9 % IV SOLN: 250.0000 mL | INTRAVENOUS | Status: DC | PRN

## 2023-02-18 MED ORDER — SODIUM CHLORIDE 0.9 % IV SOLN
500.0000 mg | INTRAVENOUS | Status: DC
  Administered 2023-02-18 – 2023-02-21 (×4): 500 mg via INTRAVENOUS
  Filled 2023-02-18 (×3): qty 5

## 2023-02-18 MED ORDER — PRAVASTATIN SODIUM 40 MG PO TABS
40.0000 mg | ORAL_TABLET | Freq: Every day | ORAL | Status: DC
  Administered 2023-02-19 – 2023-02-22 (×4): 40 mg via ORAL
  Filled 2023-02-18 (×4): qty 1

## 2023-02-18 MED ORDER — FUROSEMIDE 10 MG/ML IJ SOLN
40.0000 mg | Freq: Once | INTRAMUSCULAR | Status: AC
  Administered 2023-02-18: 40 mg via INTRAVENOUS
  Filled 2023-02-18: qty 4

## 2023-02-18 MED ORDER — SODIUM CHLORIDE 0.9 % IV SOLN
1.0000 g | INTRAVENOUS | Status: DC
  Administered 2023-02-19 – 2023-02-21 (×3): 1 g via INTRAVENOUS
  Filled 2023-02-18 (×3): qty 10

## 2023-02-18 MED ORDER — SODIUM CHLORIDE 0.9% FLUSH
3.0000 mL | Freq: Two times a day (BID) | INTRAVENOUS | Status: DC
  Administered 2023-02-18 – 2023-02-19 (×2): 3 mL via INTRAVENOUS

## 2023-02-18 MED ORDER — SODIUM CHLORIDE 0.9 % IV SOLN
2.0000 g | Freq: Once | INTRAVENOUS | Status: AC
  Administered 2023-02-18: 2 g via INTRAVENOUS
  Filled 2023-02-18: qty 20

## 2023-02-18 MED ORDER — TAMSULOSIN HCL 0.4 MG PO CAPS
0.4000 mg | ORAL_CAPSULE | Freq: Every day | ORAL | Status: DC
  Administered 2023-02-19 – 2023-02-22 (×4): 0.4 mg via ORAL
  Filled 2023-02-18 (×4): qty 1

## 2023-02-18 MED ORDER — CARVEDILOL 3.125 MG PO TABS
18.7500 mg | ORAL_TABLET | Freq: Two times a day (BID) | ORAL | Status: DC
Start: 2023-02-18 — End: 2023-02-22
  Administered 2023-02-18 – 2023-02-22 (×8): 18.75 mg via ORAL
  Filled 2023-02-18 (×8): qty 2

## 2023-02-18 MED ORDER — INSULIN ASPART 100 UNIT/ML IJ SOLN
0.0000 [IU] | Freq: Three times a day (TID) | INTRAMUSCULAR | Status: DC
  Administered 2023-02-19: 5 [IU] via SUBCUTANEOUS
  Administered 2023-02-19: 3 [IU] via SUBCUTANEOUS
  Administered 2023-02-20 (×2): 2 [IU] via SUBCUTANEOUS
  Administered 2023-02-21: 3 [IU] via SUBCUTANEOUS
  Administered 2023-02-21 – 2023-02-22 (×2): 1 [IU] via SUBCUTANEOUS
  Filled 2023-02-18: qty 1

## 2023-02-18 MED ORDER — ONDANSETRON HCL 4 MG/2ML IJ SOLN
4.0000 mg | Freq: Four times a day (QID) | INTRAMUSCULAR | Status: DC | PRN
  Administered 2023-02-18: 4 mg via INTRAVENOUS
  Filled 2023-02-18 (×2): qty 2

## 2023-02-18 MED ORDER — ENOXAPARIN SODIUM 40 MG/0.4ML IJ SOSY
40.0000 mg | PREFILLED_SYRINGE | INTRAMUSCULAR | Status: DC
  Administered 2023-02-18 – 2023-02-21 (×4): 40 mg via SUBCUTANEOUS
  Filled 2023-02-18 (×4): qty 0.4

## 2023-02-18 NOTE — H&P (Signed)
History and Physical    Patient: Anthony Adams ZOX:[id number] DOB: 11/18/1950 DOA: 02/18/2023 DOS: the patient was seen and examined on 02/18/2023 PCP: Ignatius Specking, MD  Patient coming from: Home  Chief Complaint:  Chief Complaint  Patient presents with   Shortness of Breath   HPI: Anthony Adams is a 72 y.o. male with medical history significant of type 2 diabetes mellitus, hypertension, CAD status post CABG, chronic systolic heart failure, ischemic cardiomyopathy status post ICD (did not have battery replaced due to improved EF per cardiology note) presented to the emergency department for evaluation of a fall.  As per patient's sister patient fell early morning going to bathroom, has been confused since last several days.  Per sister, he has been having episodes of falling on his knees, back since last 1 month but did not seek any medical attention.  She also reports that he has been having shortness of breath, productive cough since last 2 days.  Patient denies any chest pain, shortness of breath or palpitations, dizziness during the episodes.  No fever, chills or rigors.    In the emergency department, BUN 32, creatinine 1.3, BNP 756, troponin 42, WBC 12.1, blood sugar 266, CT head showed no evidence of intracranial abnormality, cervical spine CT showed no evidence of fracture or subluxation.  A 2.7 cm right thyroid nodule noted recommended thyroid ultrasound.  Chest x-ray showed streaky left lung base opacity, atelectasis versus pneumonia, prior granulomatous disease.  During my exam he is able to answer me appropriately, moderately short of breath, has orthopnea (asked me to raise head end of bed), has been coughing.  Patient seems poor historian and states no complaints. ED provider requested hospitalist admission for further management evaluation of fall, CHF exacerbation, possible pneumonia.  Review of Systems: unable to review all systems due to the inability of the patient to answer  questions. Past Medical History:  Diagnosis Date   AICD (automatic cardioverter/defibrillator) present 08/02/2016   Arthritis    " In my hands , back, feet " (12/20/2016)   Back pain    Recent injection with aspirin and Plavix held, patient had chest pain that day   Blood glucose elevated    March, 2012   CAD (coronary artery disease)     Chest pain while off Plavix, March, 2012, catheterization, LAD stents patent, worsening third diagonal 80%-use PCI only for recurrent significant symptoms /  ..NSTEMI..2007two DES LAD (kissing balloon)   CAD (coronary artery disease)    plavix indefinitly   CAD (coronary artery disease)    cardiac clearance .[name].for back surgery...october 2011   Chronic systolic heart failure ([facility])    Dyslipidemia    Gout    April, 201 to   Hypertension    EF 50-55%, borderline anterior hypokinesis, catheterization March, 2012   SOB (shortness of breath)    Type II diabetes mellitus [facility])    Past Surgical History:  Procedure Laterality Date   BACK SURGERY     CARDIAC CATHETERIZATION  08/2005; 05/2010   Hattie Perch 08/09/2010; notes 05/29/2010   CARDIAC DEFIBRILLATOR PLACEMENT  12/20/2016   CORONARY ANGIOPLASTY WITH STENT PLACEMENT  08/2005   Hattie Perch 08/09/2010   CORONARY ARTERY BYPASS GRAFT  05/2015   CABG X5   ICD GENERATOR REMOVAL N/A 10/12/2016   Procedure: ICD Generator Removal;  Surgeon: Marinus Maw, MD;  Location: [facility];  Service: Cardiovascular;  Laterality: N/A;   ICD IMPLANT N/A 08/02/2016   Procedure: ICD Implant;  Surgeon:  Marinus Maw, MD;  Location: [facility] IN[facility]AS[facility];  Service: Cardiovascular;  Laterality: N/A;   ICD IMPLANT N/A 12/20/2016   Procedure: ICD Implant;  Surgeon: Marinus Maw, MD;  Location: [facility] INVASIVE CV LAB;  Service: Cardiovascular;  Laterality: N/A;   ICD LEAD REMOVAL  10/12/2016   Procedure: Icd Lead Removal;  Surgeon: Marinus Maw, MD;  Location: [facility] INVASIVE CV LAB;  Service: Cardiovascular;;   LUMBAR DISC SURGERY  X  3   Social History:  reports that he quit smoking about 49 years ago. His smoking use included cigarettes. He started smoking about 64 years ago. He has a 30 pack-year smoking history. His smokeless tobacco use includes snuff. He reports that he does not drink alcohol and does not use drugs.  No Known Allergies  Family History  Problem Relation Age of Onset   Coronary artery disease Father     Prior to Admission medications   Medication Sig Start Date End Date Taking? Authorizing Provider  acetaminophen (TYLENOL) 500 MG tablet Take 1,000 mg by mouth 2 (two) times daily as needed for headache.    [provider]  carvedilol (COREG) 12.5 MG tablet TAKE 1 AND 1/2 TABLETS BY MOUTH TWICE DAILY 09/19/22   Marinus Maw, MD  diclofenac Sodium (VOLTAREN) 1 % GEL Apply 4 g topically daily as needed for pain. 07/05/21   [provider]  furosemide (LASIX) 20 MG tablet Take 20 mg by mouth daily as needed for fluid. 04/08/21   [provider]  gabapentin (NEURONTIN) 600 MG tablet Take 600 mg by mouth 4 (four) times daily. 11/03/16   [provider]  HYDROcodone-acetaminophen (NORCO) 10-325 MG tablet Take 1 tablet by mouth in the morning, at noon, in the evening, and at bedtime. 05/28/15   [provider]  losartan (COZAAR) 100 MG tablet Take 100 mg by mouth daily.    [provider]  metFORMIN (GLUCOPHAGE-XR) 500 MG 24 hr tablet Take 500 mg by mouth 2 (two) times daily.  02/15/16   [provider]  NARCAN 4 MG/0.1ML LIQD nasal spray kit Place 1 spray into the nose daily as needed (Overdose reverse). 03/05/17   [provider]  pravastatin (PRAVACHOL) 40 MG tablet Take 40 mg by mouth daily.  06/21/15 09/07/21  [provider]  QC LO-DOSE ASPIRIN 81 MG EC tablet Take 81 mg by mouth daily. 09/04/16   [provider]  tamsulosin (FLOMAX) 0.4 MG CAPS capsule Take 0.4 mg by mouth daily after breakfast.  01/12/16   [provider]    Physical Exam: Vitals:   02/18/23 1915 02/18/23 1930 02/18/23 1945 02/18/23 2009  BP: (!) 194/81 (!) 198/101 (!) 170/81 (!) 191/90  Pulse: 85 95 86 99  Resp: (!) 28 (!) 41 (!) 30   Temp:      SpO2: 94% 97% 98%   Weight:      Height:       General - Elderly ill looking Caucasian male, in moderate respiratory distress HEENT - PERRLA, EOMI, atraumatic head, non tender sinuses. Lung - Clear, bibasal rales, diffuse rhonchi, no wheezes. Heart - S1, S2 heard, no murmurs, rubs, trace pedal edema. Abdomen-soft, nontender, non distended, bowel sounds good Neuro - Alert, awake and oriented but not able to answer all my questions, non focal exam. Skin - Warm and dry. Data Reviewed:     Latest Ref Rng & Units 02/18/2023    5:59 PM 08/16/2021   12:55 PM 12/14/2016  1:05 PM  CBC  WBC 4.0 - 10.5 K/uL 12.1  5.5  7.3   Hemoglobin 13.0 - 17.0 g/dL 16.1  09.6  04.5   Hematocrit 39.0 - 52.0 % 42.6  36.4  41.6   Platelets 150 - 400 K/uL 149  134  155       Latest Ref Rng & Units 02/18/2023    5:59 PM 08/16/2021   12:55 PM 12/14/2016    1:05 PM  BMP  Glucose 70 - 99 mg/dL 409  811  914   BUN 8 - 23 mg/dL 32  17  19   Creatinine 0.61 - 1.24 mg/dL 7.82  9.56  2.13   BUN/Creat Ratio 10 - 24  14  14    Sodium 135 - 145 mmol/L 140  142  141   Potassium 3.5 - 5.1 mmol/L 3.5  4.8  4.2   Chloride 98 - 111 mmol/L 105  105  102   CO2 22 - 32 mmol/L 20  22  22    Calcium 8.9 - 10.3 mg/dL 9.4  9.1  9.7    BNP (last 3 results) Recent Labs    02/18/23 1759  BNP 756.0*   CT Head Wo Contrast  Result Date: 02/18/2023 CLINICAL DATA:  Head trauma, minor (Age >= 65y); Neck trauma (Age >= 65y) EXAM: CT HEAD WITHOUT CONTRAST CT CERVICAL SPINE WITHOUT CONTRAST TECHNIQUE: Multidetector CT imaging of the head and cervical spine was performed following the standard protocol without intravenous contrast. Multiplanar CT image reconstructions of the cervical spine were also generated. RADIATION  DOSE REDUCTION: This exam was performed according to the departmental dose-optimization program which includes automated exposure control, adjustment of the mA and/or kV according to patient size and/or use of iterative reconstruction technique. COMPARISON:  None Available. FINDINGS: CT HEAD FINDINGS Brain: No hemorrhage. No hydrocephalus. No extra-axial fluid collection. No CT evidence of an acute cortical infarct. No mass effect. No mass lesion. Vascular: No hyperdense vessel or unexpected calcification. Skull: Soft tissue swelling along the right posterior scalp. No evidence of underlying calvarial fracture. Sinuses/Orbits: No middle ear or mastoid effusion. Paranasal sinuses are clear. Orbits are unremarkable. Other: None. CT CERVICAL SPINE FINDINGS Alignment: Straightening of the normal cervical lordosis. Skull base and vertebrae: No acute fracture. No primary bone lesion or focal pathologic process. Soft tissues and spinal canal: No prevertebral fluid or swelling. No visible canal hematoma. Disc levels:  No CT evidence of high-grade spinal canal stenosis. Upper chest: Negative. Other: There is a 2.7 cm right thyroid nodule. Recommend further evaluation with dedicated thyroid ultrasound, if not previously performed. IMPRESSION: 1. No CT evidence of intracranial injury. 2. Soft tissue swelling along the right posterior scalp. No evidence of underlying calvarial fracture. 3. No acute fracture or traumatic subluxation of the cervical spine. 4. There is a 2.7 cm right thyroid nodule. Recommend further evaluation with dedicated thyroid ultrasound, if not previously performed. Electronically Signed   By: Lorenza Cambridge M.D.   On: 02/18/2023 18:29   CT Cervical Spine Wo Contrast  Result Date: 02/18/2023 CLINICAL DATA:  Head trauma, minor (Age >= 65y); Neck trauma (Age >= 65y) EXAM: CT HEAD WITHOUT CONTRAST CT CERVICAL SPINE WITHOUT CONTRAST TECHNIQUE: Multidetector CT imaging of the head and cervical spine was  performed following the standard protocol without intravenous contrast. Multiplanar CT image reconstructions of the cervical spine were also generated. RADIATION DOSE REDUCTION: This exam was performed according to the departmental dose-optimization program which includes automated exposure control, adjustment of  the mA and/or kV according to patient size and/or use of iterative reconstruction technique. COMPARISON:  None Available. FINDINGS: CT HEAD FINDINGS Brain: No hemorrhage. No hydrocephalus. No extra-axial fluid collection. No CT evidence of an acute cortical infarct. No mass effect. No mass lesion. Vascular: No hyperdense vessel or unexpected calcification. Skull: Soft tissue swelling along the right posterior scalp. No evidence of underlying calvarial fracture. Sinuses/Orbits: No middle ear or mastoid effusion. Paranasal sinuses are clear. Orbits are unremarkable. Other: None. CT CERVICAL SPINE FINDINGS Alignment: Straightening of the normal cervical lordosis. Skull base and vertebrae: No acute fracture. No primary bone lesion or focal pathologic process. Soft tissues and spinal canal: No prevertebral fluid or swelling. No visible canal hematoma. Disc levels:  No CT evidence of high-grade spinal canal stenosis. Upper chest: Negative. Other: There is a 2.7 cm right thyroid nodule. Recommend further evaluation with dedicated thyroid ultrasound, if not previously performed. IMPRESSION: 1. No CT evidence of intracranial injury. 2. Soft tissue swelling along the right posterior scalp. No evidence of underlying calvarial fracture. 3. No acute fracture or traumatic subluxation of the cervical spine. 4. There is a 2.7 cm right thyroid nodule. Recommend further evaluation with dedicated thyroid ultrasound, if not previously performed. Electronically Signed   By: Lorenza Cambridge M.D.   On: 02/18/2023 18:29   DG Chest 2 View  Result Date: 02/18/2023 CLINICAL DATA:  Shortness of breath EXAM: CHEST - 2 VIEW  COMPARISON:  12/21/2016 FINDINGS: Right-sided single lead pacing device. Post sternotomy changes. No pleural effusion or pneumothorax. Streaky left lung base opacity. Numerous calcified lung nodules and calcified hilar nodes consistent with prior granulomatous disease. Subsegmental atelectasis at the bases. IMPRESSION: 1. Streaky left lung base opacity, atelectasis versus pneumonia. 2. Evidence of prior granulomatous disease. Electronically Signed   By: Jasmine Pang M.D.   On: 02/18/2023 17:35     Assessment and Plan: Anthony Adams is a 72 y.o. male with medical history significant of type 2 diabetes mellitus, hypertension, CAD status post CABG, chronic systolic heart failure, ischemic cardiomyopathy status post ICD (did not have battery replaced due to improved EF per cardiology note) presented to the emergency department for evaluation of a fall.  He has been confused, short of breath, has cough, reportedly frequent falls since last 1 month.  He is a poor historian and does not provide much history.  Plan: Near syncope Frequent falls Admit to medicine, telemetry service. Check orthostatic vitals. Check echocardiogram, carotid Dopplers. CT head unremarkable. Fall, aspiration precautions  Community-acquired pneumonia White count 12,000, chest x-ray finding of left lung base opacity. Continue Rocephin and azithromycin therapy. Continue antitussives. Supplemental oxygen as needed. Incentive spirometry, out of bed to chair.  Acute on chronic systolic CHF exacerbation CAD s/p CABG Ischemic cardiomyopathy status post ICD Patient does have shortness of breath, bibasilar rales, elevated BNP, orthopnea. Will continue IV Lasix 40 mg daily. Continue aspirin, beta blocker, Cozaar,  Monitor daily renal function, electrolytes. Monitor daily weights, strict input and output. Check echocardiogram. If low EF may need ICD battery replacement as he reached ERI. Trend Troponin. Check lipid profile. ED  provider discussed with on-call cardiologist fellow, [facility] will see him in consultation. [facility] Dr. Lewayne Bunting saw him previously).  Generalized weakness In the setting of CHF, pneumonia. PT/ OT evaluation. Out of bed to chair. TOC evaluation for disposition.  Type 2 diabetes mellitus: Check A1c. Will start him on accuchecks, sensitive insulin sliding scale. Cardiac/ carb consistent diet. Hold Metformin.  DVT prophylaxis: Lovenox  Advance Care Planning:   Code Status: Full Code.  Consults: Cardiology  Family Communication: Discussed with patient's sister at bedside, who understand and agree with above care plan.  Severity of Illness: The appropriate patient status for this patient is INPATIENT. Inpatient status is judged to be reasonable and necessary in order to provide the required intensity of service to ensure the patient's safety. The patient's presenting symptoms, physical exam findings, and initial radiographic and laboratory data in the context of their chronic comorbidities is felt to place them at high risk for further clinical deterioration. Furthermore, it is not anticipated that the patient will be medically stable for discharge from the hospital within 2 midnights of admission.   * I certify that at the point of admission it is my clinical judgment that the patient will require inpatient hospital care spanning beyond 2 midnights from the point of admission due to high intensity of service, high risk for further deterioration and high frequency of surveillance required.*  Author: Marcelino Duster, MD 02/18/2023 8:31 PM  For on call review [email or web address].

## 2023-02-18 NOTE — ED Triage Notes (Signed)
Pt with SOB since last night.  Reported that pt fell last night going to [place], denies tripping on anything, denies hitting anything.  Family member reports pt "in and out of it".  Pt alert and and answered month correctly, when asked about year-pt responded " I don't know"

## 2023-02-18 NOTE — ED Provider Triage Note (Signed)
Emergency Medicine Provider Triage Evaluation Note  Anthony Adams , a 72 y.o. male  was evaluated in triage.  Pt complains of intermittent confusion since yesterday, complaining of shortness of breath today, had fall at about 2:30 in the morning when he got up to go the bathroom, denies LOC but is on Eliquis..  Review of Systems  Positive: Confusion, fall, sob Negative: fever  Physical Exam  BP (!) 140/91 (BP Location: Right Arm)   Pulse (!) 104   Temp 98 F (36.7 C)   Resp 20   Ht 6\' 1"  (1.854 m)   Wt 98.4 kg   SpO2 92%   BMI 28.63 kg/m  Gen:   Awake, no distress   Resp:  Normal effort  MSK:   Moves extremities without difficulty  Other:    Medical Decision Making  Medically screening exam initiated at 5:48 PM.  Appropriate orders placed.  Anthony Adams was informed that the remainder of the evaluation will be completed by another provider, this initial triage assessment does not replace that evaluation, and the importance of remaining in the ED until their evaluation is complete.     Anthony Adams, [place] 02/19/23 1357

## 2023-02-18 NOTE — ED Notes (Signed)
Placed pt on 2l [place] due to spo2 reading 88% on room air.

## 2023-02-19 ENCOUNTER — Encounter (HOSPITAL_COMMUNITY): Payer: Self-pay | Admitting: Internal Medicine

## 2023-02-19 ENCOUNTER — Inpatient Hospital Stay (HOSPITAL_COMMUNITY): Payer: Medicare HMO

## 2023-02-19 DIAGNOSIS — I5032 Chronic diastolic (congestive) heart failure: Secondary | ICD-10-CM

## 2023-02-19 DIAGNOSIS — I509 Heart failure, unspecified: Secondary | ICD-10-CM

## 2023-02-19 DIAGNOSIS — J189 Pneumonia, unspecified organism: Secondary | ICD-10-CM | POA: Diagnosis not present

## 2023-02-19 DIAGNOSIS — I5023 Acute on chronic systolic (congestive) heart failure: Secondary | ICD-10-CM | POA: Diagnosis not present

## 2023-02-19 DIAGNOSIS — R42 Dizziness and giddiness: Secondary | ICD-10-CM | POA: Diagnosis not present

## 2023-02-19 LAB — ECHOCARDIOGRAM COMPLETE
AR max vel: 3.2 cm2
AV Area VTI: 2.67 cm2
AV Area mean vel: 2.7 cm2
AV Mean grad: 3 mm[Hg]
AV Peak grad: 4.7 mm[Hg]
Ao pk vel: 1.08 m/s
Area-P 1/2: 2.01 cm2
Height: 73 in
S' Lateral: 4 cm
Weight: 3305.14 [oz_av]

## 2023-02-19 LAB — BASIC METABOLIC PANEL
Anion gap: 13 (ref 5–15)
BUN: 37 mg/dL — ABNORMAL HIGH (ref 8–23)
CO2: 25 mmol/L (ref 22–32)
Calcium: 9 mg/dL (ref 8.9–10.3)
Chloride: 104 mmol/L (ref 98–111)
Creatinine, Ser: 1.45 mg/dL — ABNORMAL HIGH (ref 0.61–1.24)
GFR, Estimated: 52 mL/min — ABNORMAL LOW (ref >60)
Glucose, Bld: 264 mg/dL — ABNORMAL HIGH (ref 70–99)
Potassium: 3.3 mmol/L — ABNORMAL LOW (ref 3.5–5.1)
Sodium: 142 mmol/L (ref 135–145)

## 2023-02-19 LAB — CBC WITH DIFFERENTIAL/PLATELET
Abs Immature Granulocytes: 0.13 10*3/uL — ABNORMAL HIGH (ref 0.00–0.07)
Basophils Absolute: 0 10*3/uL (ref 0.0–0.1)
Basophils Relative: 0 %
Eosinophils Absolute: 0 10*3/uL (ref 0.0–0.5)
Eosinophils Relative: 0 %
HCT: 40.4 % (ref 39.0–52.0)
Hemoglobin: 13.5 g/dL (ref 13.0–17.0)
Immature Granulocytes: 1 %
Lymphocytes Relative: 9 %
Lymphs Abs: 0.9 10*3/uL (ref 0.7–4.0)
MCH: 28.4 pg (ref 26.0–34.0)
MCHC: 33.4 g/dL (ref 30.0–36.0)
MCV: 84.9 fL (ref 80.0–100.0)
Monocytes Absolute: 0.4 10*3/uL (ref 0.1–1.0)
Monocytes Relative: 4 %
Neutro Abs: 9.1 10*3/uL — ABNORMAL HIGH (ref 1.7–7.7)
Neutrophils Relative %: 86 %
Platelets: 153 10*3/uL (ref 150–400)
RBC: 4.76 MIL/uL (ref 4.22–5.81)
RDW: 13.9 % (ref 11.5–15.5)
WBC: 10.6 10*3/uL — ABNORMAL HIGH (ref 4.0–10.5)
nRBC: 0 % (ref 0.0–0.2)

## 2023-02-19 LAB — LIPID PANEL
Cholesterol: 121 mg/dL (ref 0–200)
HDL: 27 mg/dL — ABNORMAL LOW (ref >40)
LDL Cholesterol: 63 mg/dL (ref 0–99)
Total CHOL/HDL Ratio: 4.5 {ratio}
Triglycerides: 155 mg/dL — ABNORMAL HIGH (ref <150)
VLDL: 31 mg/dL (ref 0–40)

## 2023-02-19 LAB — GLUCOSE, CAPILLARY
Glucose-Capillary: 218 mg/dL — ABNORMAL HIGH (ref 70–99)
Glucose-Capillary: 236 mg/dL — ABNORMAL HIGH (ref 70–99)
Glucose-Capillary: 178 mg/dL — ABNORMAL HIGH (ref 70–99)

## 2023-02-19 LAB — HEMOGLOBIN A1C
Hgb A1c MFr Bld: 6.4 % — ABNORMAL HIGH (ref 4.8–5.6)
Mean Plasma Glucose: 136.98 mg/dL

## 2023-02-19 LAB — CBG MONITORING, ED: Glucose-Capillary: 278 mg/dL — ABNORMAL HIGH (ref 70–99)

## 2023-02-19 LAB — HIV ANTIBODY (ROUTINE TESTING W REFLEX): HIV Screen 4th Generation wRfx: NONREACTIVE

## 2023-02-19 MED ORDER — DM-GUAIFENESIN ER 30-600 MG PO TB12
1.0000 | ORAL_TABLET | Freq: Two times a day (BID) | ORAL | Status: DC
  Administered 2023-02-19 – 2023-02-22 (×6): 1 via ORAL
  Filled 2023-02-19 (×6): qty 1

## 2023-02-19 MED ORDER — IPRATROPIUM-ALBUTEROL 0.5-2.5 (3) MG/3ML IN SOLN: 3.0000 mL | Freq: Four times a day (QID) | RESPIRATORY_TRACT | Status: DC | PRN

## 2023-02-19 MED ORDER — INFLUENZA VAC A&B SURF ANT ADJ 0.5 ML IM SUSY
0.5000 mL | PREFILLED_SYRINGE | Freq: Once | INTRAMUSCULAR | Status: AC
  Administered 2023-02-21: 0.5 mL via INTRAMUSCULAR
  Filled 2023-02-19: qty 0.5

## 2023-02-19 MED ORDER — POTASSIUM CHLORIDE CRYS ER 20 MEQ PO TBCR
40.0000 meq | EXTENDED_RELEASE_TABLET | Freq: Once | ORAL | Status: AC
  Administered 2023-02-19: 40 meq via ORAL
  Filled 2023-02-19: qty 2

## 2023-02-19 MED ORDER — PNEUMOCOCCAL 20-VAL CONJ VACC 0.5 ML IM SUSY
0.5000 mL | PREFILLED_SYRINGE | INTRAMUSCULAR | Status: AC | PRN
  Administered 2023-02-22: 0.5 mL via INTRAMUSCULAR
  Filled 2023-02-19: qty 0.5

## 2023-02-19 MED ORDER — PERFLUTREN LIPID MICROSPHERE
1.0000 mL | INTRAVENOUS | Status: AC | PRN
  Administered 2023-02-19: 2 mL via INTRAVENOUS

## 2023-02-19 MED ORDER — MAGNESIUM SULFATE IN D5W 1-5 GM/100ML-% IV SOLN
1.0000 g | Freq: Once | INTRAVENOUS | Status: AC
  Administered 2023-02-19: 1 g via INTRAVENOUS
  Filled 2023-02-19: qty 100

## 2023-02-19 NOTE — Evaluation (Signed)
Physical Therapy Evaluation Patient Details Name: Anthony Adams MRN: [id number] DOB: 1950/11/02 Today's Date: 02/19/2023  History of Present Illness  Anthony Adams is a 72 y.o. male with medical history significant of type 2 diabetes mellitus, hypertension, CAD status post CABG, chronic systolic heart failure, ischemic cardiomyopathy status post ICD (did not have battery replaced due to improved EF per cardiology note) presented to the emergency department for evaluation of a fall.  As per patient's sister patient fell early morning going to bathroom, has been confused since last several days.  Per sister, he has been having episodes of falling on his knees, back since last 1 month but did not seek any medical attention.  She also reports that he has been having shortness of breath, productive cough since last 2 days.  Patient denies any chest pain, shortness of breath or palpitations, dizziness during the episodes.  No fever, chills or rigors.   Clinical Impression  Patient demonstrates good return for sitting up at bedside, transferring to/from commode in bathroom and walking short distances without AD.  Patient  had a few stumbles once fatigued having to lean on walls for support and tolerated sitting up in chair after therapy with his sister present - RN notified.  Patient will benefit from continued skilled physical therapy in hospital and recommended venue below to increase strength, balance, endurance for safe ADLs and gait.       If plan is discharge home, recommend the following: A little help with walking and/or transfers;A little help with bathing/dressing/bathroom;Help with stairs or ramp for entrance;Assistance with cooking/housework   Can travel by private vehicle        Equipment Recommendations None recommended by [employer] walker (2 wheels)  Recommendations for Other Services       Functional Status Assessment Patient has had a recent decline in their functional status and  demonstrates the ability to make significant improvements in function in a reasonable and predictable amount of time.     Precautions / Restrictions Precautions Precautions: Fall Restrictions Weight Bearing Restrictions: No      Mobility  Bed Mobility Overal bed mobility: Modified Independent                  Transfers Overall transfer level: Needs assistance Equipment used: None Transfers: Sit to/from Stand, Bed to chair/wheelchair/BSC Sit to Stand: Contact guard assist   Step pivot transfers: Contact guard assist       General transfer comment: slightly unsteady with occasional leaning on walls    Ambulation/Gait Ambulation/Gait assistance: Contact guard assist Gait Distance (Feet): 40 Feet Assistive device: None Gait Pattern/deviations: Decreased step length - right, Decreased step length - left, Decreased stride length, Staggering left, Staggering right Gait velocity: decreased     General Gait Details: slightly labored cadence with occasional staggering left/right, able to self correct with occasional leaning on walls without loss of balance  Stairs            Wheelchair Mobility     Tilt Bed    Modified Rankin (Stroke Patients Only)       Balance Overall balance assessment: Needs assistance Sitting-balance support: Feet supported, No upper extremity supported Sitting balance-Leahy Scale: Good Sitting balance - Comments: seated at EOB   Standing balance support: During functional activity, No upper extremity supported Standing balance-Leahy Scale: Fair Standing balance comment: without AD  Pertinent Vitals/Pain Pain Assessment Pain Assessment: No/denies pain    Home Living Family/patient expects to be discharged to:: Private residence Living Arrangements: Children;Parent;Other relatives Available Help at Discharge: Family;Available 24 hours/day Type of Home: House Home Access: Level entry        Home Layout: One level Home Equipment: Shower seat      Prior Function Prior Level of Function : Independent/Modified Independent             Mobility Comments: Community ambulation without AD ADLs Comments: Independent     Extremity/Trunk Assessment   Upper Extremity Assessment Upper Extremity Assessment: Overall WFL for tasks assessed    Lower Extremity Assessment Lower Extremity Assessment: Generalized weakness    Cervical / Trunk Assessment Cervical / Trunk Assessment: Normal  Communication   Communication Communication: No apparent difficulties Cueing Techniques: Verbal cues;Tactile cues  Cognition   Behavior During Therapy: WFL for tasks assessed/performed Overall Cognitive Status: Within Functional Limits for tasks assessed                                          General Comments      Exercises     Assessment/Plan    PT Assessment Patient needs continued PT services  PT Problem List Decreased strength;Decreased activity tolerance;Decreased balance;Decreased mobility       PT Treatment Interventions DME instruction;Gait training;Stair training;Functional mobility training;Therapeutic activities;Therapeutic exercise;Balance training;Patient/family education    PT Goals (Current goals can be found in the Care Plan section)  Acute Rehab PT Goals Patient Stated Goal: return home with family to assist PT Goal Formulation: With patient/family Time For Goal Achievement: 02/23/23 Potential to Achieve Goals: Good    Frequency Min 3X/week     Co-evaluation               AM-PAC PT "6 Clicks" Mobility  Outcome Measure Help needed turning from your back to your side while in a flat bed without using bedrails?: None Help needed moving from lying on your back to sitting on the side of a flat bed without using bedrails?: None Help needed moving to and from a bed to a chair (including a wheelchair)?: A Little Help needed  standing up from a chair using your arms (e.g., wheelchair or bedside chair)?: A Little Help needed to walk in hospital room?: A Little Help needed climbing 3-5 steps with a railing? : A Little 6 Click Score: 20    End of Session   Activity Tolerance: Patient tolerated treatment well Patient left: in chair;with call bell/phone within reach Nurse Communication: Mobility status PT Visit Diagnosis: Unsteadiness on feet (R26.81);Other abnormalities of gait and mobility (R26.89);Muscle weakness (generalized) (M62.81)    Time: 1610-9604 PT Time Calculation (min) (ACUTE ONLY): 33 min   Charges:   PT Evaluation $PT Eval Moderate Complexity: 1 Mod PT Treatments $Therapeutic Activity: 23-37 mins PT General Charges $$ ACUTE PT VISIT: 1 Visit         2:15 PM, 02/19/23 Ocie Bob, MPT Physical Therapist with [facility] [phone] office [phone] mobile phone

## 2023-02-19 NOTE — ED Notes (Signed)
Pt in bed, pt has removed [place], pt satting 88%, replaced , pt now satting mid 90s

## 2023-02-19 NOTE — Consult Note (Addendum)
Cardiology Consult    Patient ID: Anthony Adams MRN: [id number], DOB/AGE: [birth year]   Admit date: 02/18/2023 Date of Consult: 02/19/2023  Primary Physician: Ignatius Specking, MD Primary Cardiologist: Lewayne Bunting, MD Requesting Provider: Lestine Box, MD  Patient Profile    Anthony Adams is a 72 y.o. male with a history of  CAD status post prior LAD stenting and CABG x 5, ischemic cardiomyopathy, chronic heart failure with improved ejection fraction, diabetes, hypertension, hyperlipidemia, and stage III chronic kidney disease, who is being seen today for the evaluation of dyspnea/resp failure/CHF at the request of Dr. Gwenlyn Perking.  Past Medical History  Subjective  Past Medical History:  Diagnosis Date   AICD (automatic cardioverter/defibrillator) present 08/02/2016   a. 07/2016 biotronik-->req removal in 09/2016 and replacement in 11/2016 due to stitch abscess.  No plan to replace due to improved EF.   Arthritis    " In my hands , back, feet " (12/20/2016)   Blood glucose elevated    March, 2012   CAD (coronary artery disease)    a. 2007 PCI/DES x 2 LAD; b. 05/2010 Cath: LM 10d, LAD 71m, 39m ISR, 80-90d, D2 20ost, D3 80, small, LCX nondom, nl, RI 56m, RCA large, 20p, 3m, RPDA 40p, RPL1 90ost, RPL2 80-->Med rx; c. 05/2015 CABG x 5 ([facility]).   CKD (chronic kidney disease), stage III (HCC)    Dyslipidemia    Gout    April, 201 to   Heart failure with improved ejection fraction (HFimpEF) (HCC)    a. 01/2016 Echo: EF 20%; b. 06/2016 Echo: EF 30%; c. 08/2021 Echo: EF 50-55%. mild LVH. GrI DD. nl RV fxn. Sev dil LA. Triv MR.   Hypertension    Ischemic cardiomyopathy    a. 01/2016 Echo: EF 20%; b. 06/2016 Echo: EF 30%; c. 07/2016 s/p Biotronik AICD->removed 09/2016 due to stitch abscess->replaced 11/2016; d. 08/2021 Echo: EF 50-55%.   SOB (shortness of breath)    Type II diabetes mellitus (HCC)     Past Surgical History:  Procedure Laterality Date   BACK SURGERY     CARDIAC CATHETERIZATION  08/2005;  05/2010   Hattie Perch 08/09/2010; notes 05/29/2010   CARDIAC DEFIBRILLATOR PLACEMENT  12/20/2016   CORONARY ANGIOPLASTY WITH STENT PLACEMENT  08/2005   Hattie Perch 08/09/2010   CORONARY ARTERY BYPASS GRAFT  05/2015   CABG X5   ICD GENERATOR REMOVAL N/A 10/12/2016   Procedure: ICD Generator Removal;  Surgeon: Marinus Maw, MD;  Location: [facility] INVASIVE CV LAB;  Service: Cardiovascular;  Laterality: N/A;   ICD IMPLANT N/A 08/02/2016   Procedure: ICD Implant;  Surgeon: Marinus Maw, MD;  Location: [facility] INVASIVE CV LAB;  Service: Cardiovascular;  Laterality: N/A;   ICD IMPLANT N/A 12/20/2016   Procedure: ICD Implant;  Surgeon: Marinus Maw, MD;  Location: [facility] INVASIVE CV LAB;  Service: Cardiovascular;  Laterality: N/A;   ICD LEAD REMOVAL  10/12/2016   Procedure: Icd Lead Removal;  Surgeon: Marinus Maw, MD;  Location: [facility] INVASIVE CV LAB;  Service: Cardiovascular;;   LUMBAR DISC SURGERY  X 3     Allergies  No Known Allergies    History of Present Illness     72 year old male with a history of CAD status post prior LAD stenting and CABG x 5, ischemic cardiomyopathy, chronic heart failure with improved ejection fraction, diabetes, hypertension, hyperlipidemia, and stage III chronic kidney disease.  As noted, he suffered a non-STEMI in 2007, and underwent kissing balloon angioplasty and stenting in  the LAD.  He subsequently developed chest pain in 2012 after coming off of Plavix for spinal injection and underwent diagnostic catheterization revealing severe, stable apical LAD and RPL Kaytie Ratcliffe disease as well as worsening of disease in the third diagonal.  There were no culprits for intervention and he was medically managed but subsequently had a non-STEMI in early 2017 with finding of worsening disease, and underwent CABG x 5 in [place].  In the setting of persistent LV dysfunction, with an EF previously as low as 20%, he underwent ICD placement in May 2018.  This was complicated by stitch abscess requiring  explant in July 2018, with reimplantation of a Biotronik AICD and September 2018.  EF subsequently improved, with echo in June 2023 showing an EF of 50-55% without significant valvular disease.  He has since been followed by Dr. Ladona Ridgel, and at visit in February 2024, was noted that his ICD battery was depleted but in the setting of improved EF, decision was made to forego generator change.   Patient lives locally with his mother.  He is her primary care provider and that setting, is fairly active.  He does not typically experience chest pain or dyspnea.  He has been experiencing orthostatic lightheadedness and was previously told by his primary care provider to steady himself after standing.  He is unaware of any recent medication changes and does not think he is ever had orthostatic vital signs performed.  He was in his usual state of health until approximately 1 week ago, when he began to experience sinus, nasal, and chest congestion as well as rhinorrhea and cough.  This progressed over the remainder last week, though he never felt like he was completely pulled out.  He stayed with his brother-in-law on the evening of the 22nd, and per his sister, was noted to be somewhat out of it with some alteration in mental status and perhaps worsening orthostasis.  On the morning of November 24, patient awoke in the middle of the night and fell while walking to the bathroom.  Patient does not remember what occurred.  His mother got up and had to call her other son to help him back into bed, as he was too weak.  He remained weak with productive cough throughout the day on the 24th, prompting family to bring him to the emergency department.  On presentation to the ED, he was afebrile, tachycardic, and hypertensive (peak 198/101).  SpO2 92% on RA, and he was placed on nasal cannula.  ECG showed sinus tachycardia, 118, PVCs, delayed R progression.  Labs notable for relatively stable creatinine of 1.3 with an elevated BUN of  32, BNP of 756.0, troponin 42  40, WBC 12.1 with normal H&H.  Chest x-ray showed streaky left lung base opacity-atelectasis versus pneumonia, with evidence of prior granulomatous disease.  Head CT showed soft tissue swelling along the right posterior scalp without fracture, and 2.7 cm right thyroid nodule.  Here, he received azithromycin, ceftriaxone, 40 mg of IV Lasix.  Urine has been dark and pt hasn't noted much response from Lasix.  Currently remains very congested and intermittently coughing with dark, thick sputum.  No recent history of chest pain.  Inpatient Medications  Subjective    aspirin EC  81 mg Oral Daily   carvedilol  18.75 mg Oral BID   enoxaparin (LOVENOX) injection  40 mg Subcutaneous Q24H   furosemide  40 mg Intravenous Daily   insulin aspart  0-5 Units Subcutaneous QHS   insulin aspart  0-9 Units Subcutaneous TID WC   losartan  50 mg Oral Daily   pravastatin  40 mg Oral Daily   sodium chloride flush  3 mL Intravenous Q12H   tamsulosin  0.4 mg Oral QPC breakfast    Family History    Family History  Problem Relation Age of Onset   Coronary artery disease Father    He indicated that his mother is alive. He indicated that his father is deceased. He indicated that all of his three sisters are alive. He indicated that all of his three brothers are alive. He indicated that his maternal grandmother is deceased. He indicated that his maternal grandfather is deceased. He indicated that his paternal grandmother is deceased. He indicated that his paternal grandfather is deceased.   Social History    Social History   Socioeconomic History   Marital status: Divorced    Spouse name: Not on file   Number of children: Not on file   Years of education: Not on file   Highest education level: Not on file  Occupational History   Occupation: Scientist, product/process development    Employer: [employer]  Tobacco Use   Smoking status: Former    Current packs/day: 0.00    Average packs/day: 2.0  packs/day for 15.0 years (30.0 ttl pk-yrs)    Types: Cigarettes    Start date: 09/25/1958    Quit date: 09/24/1973    Years since quitting: 49.4   Smokeless tobacco: Current    Types: Snuff   Tobacco comments:    12/20/2016 "I have been dipping snuff since I was ~  72 years old"  Vaping Use   Vaping status: Never Used  Substance and Sexual Activity   Alcohol use: No   Drug use: No   Sexual activity: Never  Other Topics Concern   Not on file  Social History Narrative   Not on file   Social Determinants of Health   Financial Resource Strain: Not on file  Food Insecurity: No Food Insecurity (02/19/2023)   Hunger Vital Sign    Worried About Running Out of Food in the Last Year: Never true    Ran Out of Food in the Last Year: Never true  Transportation Needs: No Transportation Needs (02/19/2023)   PRAPARE - Administrator, Civil Service (Medical): No    Lack of Transportation (Non-Medical): No  Physical Activity: Not on file  Stress: Not on file  Social Connections: Not on file  Intimate Partner Violence: Patient Unable To Answer (02/19/2023)   Humiliation, Afraid, Rape, and Kick questionnaire    Fear of Current or Ex-Partner: Patient unable to answer    Emotionally Abused: Patient unable to answer    Physically Abused: Patient unable to answer    Sexually Abused: Patient unable to answer     Review of Systems    General:  +++ Generalized malaise/weakness no chills, fever, night sweats or weight changes.  Cardiovascular:  No chest pain, +++ dyspnea on exertion, no edema, orthopnea, palpitations, paroxysmal nocturnal dyspnea.  +++ Somewhat chronic orthostatic lightheadedness. Dermatological: No rash, lesions/masses Respiratory: +++ Productive cough with thick green/brown sputum over the past week, +++ dyspnea Urologic: No hematuria, dysuria Abdominal:   No nausea, vomiting, diarrhea, bright red blood per rectum, melena, or hematemesis Neurologic:  No visual changes,  +++ generalized wkns, changes in mental status. All other systems reviewed and are otherwise negative except as noted above.    Objective  Physical Exam    Blood pressure [name])  177/96, pulse 80, temperature 98 F (36.7 C), temperature source Oral, resp. rate (!) 23, height 6\' 1"  (1.854 m), weight 98.4 kg, SpO2 96%.  General: Pleasant, NAD Psych: Normal affect. Neuro: Alert and oriented X 3. Moves all extremities spontaneously. HEENT: Normal  Neck: Supple without bruits or JVD. Lungs:  Resp regular and unlabored, very coarse breath sounds throughout. Heart: RRR, distant heart sounds, no s3, s4, or murmurs. Abdomen: Soft, non-tender, non-distended, BS + x 4.  Extremities: No clubbing, cyanosis or edema. DP/PT2+, Radials 2+ and equal bilaterally.  Labs    Cardiac Enzymes Recent Labs  Lab 02/18/23 1759 02/18/23 2007  TROPONINIHS 42* 40*     BNP    Component Value Date/Time   BNP 756.0 (H) 02/18/2023 1759    Lab Results  Component Value Date   WBC 10.6 (H) 02/19/2023   HGB 13.5 02/19/2023   HCT 40.4 02/19/2023   MCV 84.9 02/19/2023   PLT 153 02/19/2023    Recent Labs  Lab 02/18/23 1759 02/19/23 0440  NA 140 142  K 3.5 3.3*  CL 105 104  CO2 20* 25  BUN 32* 37*  CREATININE 1.30* 1.45*  CALCIUM 9.4 9.0  PROT 7.8  --   BILITOT 1.9*  --   ALKPHOS 47  --   ALT 15  --   AST 24  --   GLUCOSE 266* 264*   Lab Results  Component Value Date   CHOL 121 02/19/2023   HDL 27 (L) 02/19/2023   LDLCALC 63 02/19/2023   TRIG 155 (H) 02/19/2023      Radiology Studies    CT Head Wo Contrast  Result Date: 02/18/2023 CLINICAL DATA:  Head trauma, minor (Age >= 65y); Neck trauma (Age >= 65y) EXAM: CT HEAD WITHOUT CONTRAST CT CERVICAL SPINE WITHOUT CONTRAST TECHNIQUE: Multidetector CT imaging of the head and cervical spine was performed following the standard protocol without intravenous contrast. Multiplanar CT image reconstructions of the cervical spine were also generated.  RADIATION DOSE REDUCTION: This exam was performed according to the departmental dose-optimization program which includes automated exposure control, adjustment of the mA and/or kV according to patient size and/or use of iterative reconstruction technique. COMPARISON:  None Available. FINDINGS: CT HEAD FINDINGS Brain: No hemorrhage. No hydrocephalus. No extra-axial fluid collection. No CT evidence of an acute cortical infarct. No mass effect. No mass lesion. Vascular: No hyperdense vessel or unexpected calcification. Skull: Soft tissue swelling along the right posterior scalp. No evidence of underlying calvarial fracture. Sinuses/Orbits: No middle ear or mastoid effusion. Paranasal sinuses are clear. Orbits are unremarkable. Other: None. CT CERVICAL SPINE FINDINGS Alignment: Straightening of the normal cervical lordosis. Skull base and vertebrae: No acute fracture. No primary bone lesion or focal pathologic process. Soft tissues and spinal canal: No prevertebral fluid or swelling. No visible canal hematoma. Disc levels:  No CT evidence of high-grade spinal canal stenosis. Upper chest: Negative. Other: There is a 2.7 cm right thyroid nodule. Recommend further evaluation with dedicated thyroid ultrasound, if not previously performed. IMPRESSION: 1. No CT evidence of intracranial injury. 2. Soft tissue swelling along the right posterior scalp. No evidence of underlying calvarial fracture. 3. No acute fracture or traumatic subluxation of the cervical spine. 4. There is a 2.7 cm right thyroid nodule. Recommend further evaluation with dedicated thyroid ultrasound, if not previously performed. Electronically Signed   By: Lorenza Cambridge M.D.   On: 02/18/2023 18:29   CT Cervical Spine Wo Contrast  Result Date: 02/18/2023 CLINICAL DATA:  Head trauma, minor (Age >= 65y); Neck trauma (Age >= 65y) EXAM: CT HEAD WITHOUT CONTRAST CT CERVICAL SPINE WITHOUT CONTRAST TECHNIQUE: Multidetector CT imaging of the head and cervical  spine was performed following the standard protocol without intravenous contrast. Multiplanar CT image reconstructions of the cervical spine were also generated. RADIATION DOSE REDUCTION: This exam was performed according to the departmental dose-optimization program which includes automated exposure control, adjustment of the mA and/or kV according to patient size and/or use of iterative reconstruction technique. COMPARISON:  None Available. FINDINGS: CT HEAD FINDINGS Brain: No hemorrhage. No hydrocephalus. No extra-axial fluid collection. No CT evidence of an acute cortical infarct. No mass effect. No mass lesion. Vascular: No hyperdense vessel or unexpected calcification. Skull: Soft tissue swelling along the right posterior scalp. No evidence of underlying calvarial fracture. Sinuses/Orbits: No middle ear or mastoid effusion. Paranasal sinuses are clear. Orbits are unremarkable. Other: None. CT CERVICAL SPINE FINDINGS Alignment: Straightening of the normal cervical lordosis. Skull base and vertebrae: No acute fracture. No primary bone lesion or focal pathologic process. Soft tissues and spinal canal: No prevertebral fluid or swelling. No visible canal hematoma. Disc levels:  No CT evidence of high-grade spinal canal stenosis. Upper chest: Negative. Other: There is a 2.7 cm right thyroid nodule. Recommend further evaluation with dedicated thyroid ultrasound, if not previously performed. IMPRESSION: 1. No CT evidence of intracranial injury. 2. Soft tissue swelling along the right posterior scalp. No evidence of underlying calvarial fracture. 3. No acute fracture or traumatic subluxation of the cervical spine. 4. There is a 2.7 cm right thyroid nodule. Recommend further evaluation with dedicated thyroid ultrasound, if not previously performed. Electronically Signed   By: Lorenza Cambridge M.D.   On: 02/18/2023 18:29   DG Chest 2 View  Result Date: 02/18/2023 CLINICAL DATA:  Shortness of breath EXAM: CHEST - 2 VIEW  COMPARISON:  12/21/2016 FINDINGS: Right-sided single lead pacing device. Post sternotomy changes. No pleural effusion or pneumothorax. Streaky left lung base opacity. Numerous calcified lung nodules and calcified hilar nodes consistent with prior granulomatous disease. Subsegmental atelectasis at the bases. IMPRESSION: 1. Streaky left lung base opacity, atelectasis versus pneumonia. 2. Evidence of prior granulomatous disease. Electronically Signed   By: Jasmine Pang M.D.   On: 02/18/2023 17:35      ECG & Cardiac Imaging    Sinus tachycardia, 118, PVCs, delayed R progression - personally reviewed.  Assessment & Plan    1.  Acute respiratory failure/community-acquired pneumonia: Patient presented with about a 1 week history of progressive productive cough, congestion, and weakness.  WBC 12,000.  Chest x-ray with left lung base opacity.  Antibiotics per primary team.  2.  Chronic heart failure with improved ejection fraction/ischemic cardiomyopathy: Status post ICD in 2018 with echo in 2023 showing improvement in EF.  ICD at end-of-life without plan for replacement in the setting of improved EF.  Patient feels as though his volume has been stable at home and has been actually losing weight.  Progressive dyspnea over the past week in the setting of above.  Here, BNP elevated at 756 with mild troponin elevation to a peak of 42.  Following intravenous Lasix yesterday, his creatinine rose to 1.45 with BUN of 37 and bicarb of 25, up from 20 yesterday.  On examination, he does not appear to be significantly volume overloaded.  His urine is quite dark in the suction canister.  Follow-up echo.  Hold any further doses of IV Lasix for now.  Continue beta-blocker and ARB.  3.  Coronary artery disease/demand ischemia: Prior history of CAD status post CABG x 5 in 2017.  He is fairly active at home and has not been experiencing any chest pain.  In the setting of above, troponin elevated at 42.  Follow-up echo and  consider outpatient ischemic evaluation.  Continue aspirin, beta-blocker, ARB, and statin.  4.  Primary hypertension: Blood pressure elevated here in the ED.  Follow with resumption of home medications and adjust as necessary, with attention to orthostasis.  5.  Hyperlipidemia: Continue statin therapy.  LDL was 63 this morning.  6.  Acute on chronic stage III kidney disease: BUN and creatinine higher following diuresis.  Will hold.  Follow.  7.  Hypokalemia: Supplement.  8.  Orthostatic lightheadedness: Seems to be chronic but worsening.  Several falls this weekend without acute trauma on head CT.  Holding diuretic as above.  Will order orthostatic vital signs.  Risk Assessment/Risk Scores:           Signed, Nicolasa Ducking, NP 02/19/2023, 9:56 AM  For questions or updates, please contact   Please consult [email or web address] for contact info under Cardiology/STEMI.  Attending note  Patient seen and discussed with NP Brion Aliment, I agree with his documentation. 72 yo male history of HFimpEF , AICD, HTN, CAD with prior BMS to LAD and PCI to diag in 2007. Cath 2012 patent stent, D3 small vessel with 80% lesions managed medically. Subsequent CABG in 05/2015, admitted with SOB and productive cough. Episodes of severe dizziness only with standing, has had prior falls.  Admitted with CAP by primary team     K 3.5 Cr 1.30 BUN 32 WBC 12.1 Plt 149  BNP 756  Trop 42-->40 EKG sinus tach, rare PVCs CXR left lung base opacity CT head: no acuteprocess Echo pending  08/2021 echo: LVEF 50-55%, grade I dd, normal RV  Assessment/Plan 1.Dizziness/falls - dizziness/ falls only with standing. Poor oral intake per family over last few days, along with some frequent diarrhea.  - awaiting orthostatics - no significant arrhythmias thus far - suspect orthostatic symptoms, await orthostatic vitals. Agree with discontinuing diuretics.    2. CAP - abx per primary team  3. CAD - mild flat trop in setting  of pneumonia not consistent with ACS. EKG without acute ischemic changes.   4. Chronic HFimpEF - 08/2021 echo: LVEF 50-55%, grade I dd, normal RV - repeat echo pending - CXR with pneumona, BNP 756. Further uptrend in Cr/BUN with diuretics yetserday, would hold - f/u reds vest and orthostatic vital signs.   Dina Rich MD

## 2023-02-19 NOTE — Plan of Care (Signed)
  Problem: Fluid Volume: Goal: Ability to maintain a balanced intake and output will improve Outcome: Progressing   Problem: Education: Goal: Knowledge of General Education information will improve Description: Including pain rating scale, medication(s)/side effects and non-pharmacologic comfort measures Outcome: Progressing   Problem: Safety: Goal: Ability to remain free from injury will improve Outcome: Progressing

## 2023-02-19 NOTE — TOC CM/SW Note (Addendum)
Transition of Care [facility]) - Inpatient Brief Assessment   Patient Details  Name: Anthony Adams MRN: [id number] Date of Birth: 14-Feb-1951  Transition of Care [facility]) CM/SW Contact:    Villa Herb, LCSWA Phone Number: 02/19/2023, 10:22 AM   Clinical Narrative: Transition of Care Department [facility]) has reviewed patient and no TOC needs have been identified at this time. We will continue to monitor patient advancement through interdisciplinary progression rounds. If new patient transition needs arise, please place a TOC consult.  CHF resources added to AVS for pt to review at D/C.   Transition of Care Asessment: Insurance and Status: Insurance coverage has been reviewed Patient has primary care physician: Yes Home environment has been reviewed: From home Prior level of function:: Independent Prior/Current Home Services: No current home services Social Determinants of Health Reivew: SDOH reviewed no interventions necessary Readmission risk has been reviewed: Yes Transition of care needs: no transition of care needs at this time

## 2023-02-19 NOTE — Plan of Care (Signed)
  Problem: Acute Rehab PT Goals(only PT should resolve) Goal: Pt Will Go Supine/Side To Sit Outcome: Progressing Flowsheets (Taken 02/19/2023 1416) Pt will go Supine/Side to Sit:  Independently  with modified independence Goal: Patient Will Transfer Sit To/From Stand Outcome: Progressing Flowsheets (Taken 02/19/2023 1416) Patient will transfer sit to/from stand:  with modified independence  Independently Goal: Pt Will Transfer Bed To Chair/Chair To Bed Outcome: Progressing Flowsheets (Taken 02/19/2023 1416) Pt will Transfer Bed to Chair/Chair to Bed:  Independently  with modified independence Goal: Pt Will Ambulate Outcome: Progressing Flowsheets (Taken 02/19/2023 1416) Pt will Ambulate:  100 feet  with supervision  with modified independence  with least restrictive assistive device   2:17 PM, 02/19/23 Ocie Bob, MPT Physical Therapist with [facility] [phone] office [phone] mobile phone

## 2023-02-19 NOTE — Progress Notes (Incomplete)
Attending note  Patient seen and discussed with NP Brion Aliment, I agree with his documentation. 72 yo male history of HFimpEF , AICD, HTN, CAD with prior BMS to LAD and PCI to diag in 2007. Cath 2012 patent stent, D3 small vessel with 80% lesions managed medically. Subsequent CABG in 05/2015, admitted with SOB and productive cough. Episodes of severe dizziness only with standing, has had prior falls.  Admitted with CAP by primary team     K 3.5 Cr 1.30 BUN 32 WBC 12.1 Plt 149  BNP 756  Trop 42-->40 EKG sinus tach, rare PVCs CXR left lung base opacity CT head: no acuteprocess Echo pending  08/2021 echo: LVEF 50-55%, grade I dd, normal RV  Assessment/Plan 1.Dizziness/falls - dizziness/ falls only with standing. Poor oral intake per family over last few days, along with some frequent diarrhea.  - awaiting orthostatics - no significant arrhythmias thus far - suspect orthostatic symptoms, await orthostatic vitals. Agree with discontinuing diuretics.    2. CAP - abx per primary team  3. CAD - mild flat trop in setting of pneumonia not consistent with ACS. EKG without acute ischemic changes.   4. Chronic HFimpEF - 08/2021 echo: LVEF 50-55%, grade I dd, normal RV - repeat echo pending - CXR with pneumona, BNP 756. Further uptrend in Cr/BUN with diuretics yetserday, would hold - f/u reds vest and orthostatic vital signs.   Dina Rich MD

## 2023-02-19 NOTE — ED Notes (Signed)
PT has completed working with pt, pt sitting up in a chair, pt currently satting 94% on room air. Pt denies pain.

## 2023-02-19 NOTE — TOC Initial Note (Signed)
Transition of Care [facility]) - Initial/Assessment Note    Patient Details  Name: Anthony Adams MRN: [id number] Date of Birth: April 04, 1950  [facility] CM/SW Contact:    Villa Herb, LCSWA Phone Number: 02/19/2023, 3:33 PM  Clinical Narrative:                 CSW updated that PT is recommending HH PT and a rolling walker for pt. CSW met with pts son at bedside while pt was sleeping. Pts son states pt does not have a walker and they would be interested in one being ordered. CSW reached out to [place] with [employer] who will work on referral and get walker drop shipped to pts home. Pts son states they are interested in [facility] and do not have an agency preference. TOC to follow.   Expected Discharge Plan: Home w Home Health Services Barriers to Discharge: Continued Medical Work up   Patient Goals and CMS Choice Patient states their goals for this hospitalization and ongoing recovery are:: return home CMS Medicare.gov Compare Post Acute Care list provided to:: Patient Choice offered to / list presented to : Patient, Adult Children      Expected Discharge Plan and Services In-house Referral: Clinical Social Work Discharge Planning Services: CM Consult   Living arrangements for the past 2 months: Single Family Home                 DME Arranged: Walker rolling DME Agency: [employer] Date DME Agency Contacted: 02/19/23   Representative spoke with at DME Agency: Ian Malkin            Prior Living Arrangements/Services Living arrangements for the past 2 months: Single Family Home Lives with:: Self Patient language and need for interpreter reviewed:: Yes Do you feel safe going back to the place where you live?: Yes      Need for Family Participation in Patient Care: Yes (Comment) Care giver support system in place?: Yes (comment)   Criminal Activity/Legal Involvement Pertinent to Current Situation/Hospitalization: No - Comment as needed  Activities of Daily Living   ADL  Screening (condition at time of admission) Independently performs ADLs?: No Does the patient have a NEW difficulty with bathing/dressing/toileting/self-feeding that is expected to last >3 days?: Yes (Initiates electronic notice to provider for possible OT consult) Does the patient have a NEW difficulty with getting in/out of bed, walking, or climbing stairs that is expected to last >3 days?: Yes (Initiates electronic notice to provider for possible PT consult) Does the patient have a NEW difficulty with communication that is expected to last >3 days?: Yes (Initiates electronic notice to provider for possible SLP consult) Is the patient deaf or have difficulty hearing?: No Does the patient have difficulty seeing, even when wearing glasses/contacts?: No Does the patient have difficulty concentrating, remembering, or making decisions?: Yes  Permission Sought/Granted                  Emotional Assessment Appearance:: Appears stated age       Alcohol / Substance Use: Not Applicable Psych Involvement: No (comment)  Admission diagnosis:  CHF exacerbation (HCC) [I50.9] Acute on chronic congestive heart failure, unspecified heart failure type [facility]) [I50.9] Patient Active Problem List   Diagnosis Date Noted   CHF exacerbation (HCC) 02/18/2023   Other restrictive cardiomyopathy (HCC) 05/25/2022   Chronic systolic (congestive) heart failure (HCC) 12/20/2016   Chronic systolic heart failure (HCC) 08/02/2016   ICD (implantable cardioverter-defibrillator) in place 08/02/2016   S/P  CABG x 5 08/16/2015   NSTEMI (non-ST elevated myocardial infarction) (HCC) 06/15/2015   Hypertension    CAD (coronary artery disease)    Blood glucose elevated    Dyslipidemia    Back pain    Gout    DYSLIPIDEMIA 06/16/2009   Essential hypertension 06/16/2009   CAD 06/16/2009   SHORTNESS OF BREATH 06/16/2009   PCP:  Ignatius Specking, MD Pharmacy:   [facility] - [place], [place] - [address] [place] [address] Phone: [phone] Fax: [phone]     Social Determinants of Health (SDOH) Social History: SDOH Screenings   Food Insecurity: No Food Insecurity (02/19/2023)  Housing: Low Risk  (02/19/2023)  Transportation Needs: No Transportation Needs (02/19/2023)  Utilities: Not At Risk (02/19/2023)  Tobacco Use: High Risk (02/18/2023)   SDOH Interventions:     Readmission Risk Interventions     No data to display

## 2023-02-19 NOTE — Progress Notes (Addendum)
Progress Note   Patient: Anthony Adams [id number]:[id number] DOB: [birth year] DOA: 02/18/2023     1 DOS: the patient was seen and examined on 02/19/2023   Brief hospital admission course: As per H&P written by Dr.Sreeram on 02/18/2023 Anthony Adams is a 72 y.o. male with medical history significant of type 2 diabetes mellitus, hypertension, CAD status post CABG, chronic systolic heart failure, ischemic cardiomyopathy status post ICD (did not have battery replaced due to improved EF per cardiology note) presented to the emergency department for evaluation of a fall.  As per patient's sister patient fell early morning going to bathroom, has been confused since last several days.  Per sister, he has been having episodes of falling on his knees, back since last 1 month but did not seek any medical attention.  She also reports that he has been having shortness of breath, productive cough since last 2 days.  Patient denies any chest pain, shortness of breath or palpitations, dizziness during the episodes.  No fever, chills or rigors.     In the emergency department, BUN 32, creatinine 1.3, BNP 756, troponin 42, WBC 12.1, blood sugar 266, CT head showed no evidence of intracranial abnormality, cervical spine CT showed no evidence of fracture or subluxation.  A 2.7 cm right thyroid nodule noted recommended thyroid ultrasound.  Chest x-ray showed streaky left lung base opacity, atelectasis versus pneumonia, prior granulomatous disease.   During my exam he is able to answer me appropriately, moderately short of breath, has orthopnea (asked me to raise head end of bed), has been coughing.  Patient seems poor historian and states no complaints. ED provider requested hospitalist admission for further management evaluation of fall, CHF exacerbation, possible pneumonia.  Assessment and Plan: 1-near syncope/frequent falls -Follow 2D echo and carotid Dopplers -CT head unremarkable -Follow PT/OT evaluation -Follow  recommendations by EP  2-community-acquired pneumonia -Continue current IV antibiotics -Continue antitussives/mucolytic's and the use of flutter valve -As needed bronchodilators and oxygen supplementation will be provided.  3-acute on chronic systolic heart failure -Patient with underlying history of coronary artery disease status post CABG and also Ischemic cardiomyopathy status post ICD. -Continue daily weights, strict I's and O's and IV diuresis -Cardiology service has been consulted and will follow recommendation -after reviewing EP chart and discussing with cardiology service not exchange in his ICD battery will be attempted at the moment (last EF normalized). Will follow Echo and follow further rec's.  -Continue the use of beta-blocker, Cozaar and follow cardiology recommendations for further GDMT. -Will closely follow renal function and electrolytes. -For history of coronary artery disease we will continue the use of aspirin and statin.  4-hypokalemia -Follow electrolytes and further replete as needed -Appears to be associated with diuresis -Will check magnesium level.  5-type 2 diabetes mellitus -Follow update of A1c -Continue holding oral hypoglycemic agents while inpatient -Continue sliding scale insulin, follow CBG fluctuation and adjust hypoglycemic regimen as needed.  6-hyperlipidemia -Continue statin.  7-history of BPH -Continue Flomax.  8-essential hypertension -Continue current antihypertensive regimen and follow vital signs.  9-chronic kidney disease stage IIIa -Needs to be stable and compensated -Continue to follow renal function trend while diuresing -Maintain adequate oral hydration. -Minimize nephrotoxic agents and the use of contrast.  Subjective:  Complaining of shortness of breath, intermittent coughing spells and orthopnea.  No fever, no nausea, no vomiting, no chest pain.  Physical Exam: Vitals:   02/19/23 0617 02/19/23 0700 02/19/23 0803  02/19/23 0900  BP:  138/61 (!) 155/91 [name])  177/96  Pulse:  69 92 80  Resp:  (!) 21 (!) 24 (!) 23  Temp: 98.9 F (37.2 C)  98 F (36.7 C)   TempSrc: Oral  Oral   SpO2:  (!) 88% 97% 96%  Weight:      Height:       General exam: Alert, awake, following commands adequately; no chest pain, no nausea, no vomiting, no fever. Respiratory system: 2 L nasal cannula supplementation in place.  Positive rhonchi bilaterally and positive fine crackles at the bases.  Mild expiratory wheezing also appreciated. Cardiovascular system:RRR.  No rubs, no gallops. Gastrointestinal system: Abdomen is nondistended, soft and nontender. No organomegaly or masses felt. Normal bowel sounds heard. Central nervous system: Moving 4 limbs spontaneously.  No focal neurological deficits. Extremities: No cyanosis or clubbing. Skin: No petechiae. Psychiatry: Mood & affect appropriate.   Data Reviewed: A1c: Pending CBC: WBCs 10.6, hemoglobin 13.5 and platelet count 153K Basic metabolic panel: Sodium 142, potassium 3.3, chloride 104, bicarb 25, glucose 264, BUN 37, creatinine 1.4 and GFR 52.  Family Communication: Wife at bedside.  Disposition: Status is: Inpatient Remains inpatient appropriate because: Continue IV diuresis and IV antibiotics.   Planned Discharge Destination: Home; patient will most likely benefit of home health PT at discharge.  Time spent: 55 minutes  Author: Vassie Loll, MD 02/19/2023 10:36 AM  For on call review [email or web address].

## 2023-02-19 NOTE — ED Provider Notes (Signed)
[facility] Provider Note  CSN: [id number] Arrival date & time: 02/18/23 1619  Chief Complaint(s) Shortness of Breath  HPI Anthony Adams is a 72 y.o. male with PMH CAD status post CABG, CHF, ischemic cardiomyopathy status post ICD placement with reported improvement of his EF, ICD battery failure but not replaced due to improved EF who presents emergency department for evaluation of a fall and shortness of breath.  Patient reportedly suffered a fall today and has been confused over the last few days.  Patient states that he feels more short of breath than usual.  Denies chest pain, abdominal pain, nausea, vomiting or other systemic symptoms.   Past Medical History Past Medical History:  Diagnosis Date   AICD (automatic cardioverter/defibrillator) present 08/02/2016   a. 07/2016 biotronik-->req removal in 09/2016 and replacement in 11/2016 due to stitch abscess.  No plan to replace due to improved EF.   Arthritis    " In my hands , back, feet " (12/20/2016)   Blood glucose elevated    March, 2012   CAD (coronary artery disease)    a. 2007 PCI/DES x 2 LAD; b. 05/2010 Cath: LM 10d, LAD 104m, 83m ISR, 80-90d, D2 20ost, D3 80, small, LCX nondom, nl, RI 77m, RCA large, 20p, 85m, RPDA 40p, RPL1 90ost, RPL2 80-->Med rx; c. 05/2015 CABG x 5 ([facility]).   CKD (chronic kidney disease), stage III (HCC)    Dyslipidemia    Gout    April, 201 to   Heart failure with improved ejection fraction (HFimpEF) (HCC)    a. 01/2016 Echo: EF 20%; b. 06/2016 Echo: EF 30%; c. 08/2021 Echo: EF 50-55%. mild LVH. GrI DD. nl RV fxn. Sev dil LA. Triv MR.   Hypertension    Ischemic cardiomyopathy    a. 01/2016 Echo: EF 20%; b. 06/2016 Echo: EF 30%; c. 07/2016 s/p Biotronik AICD->removed 09/2016 due to stitch abscess->replaced 11/2016; d. 08/2021 Echo: EF 50-55%.   SOB (shortness of breath)    Type II diabetes mellitus (HCC)    Patient Active Problem List   Diagnosis Date Noted   CHF exacerbation (HCC) 02/18/2023    Other restrictive cardiomyopathy (HCC) 05/25/2022   Chronic systolic (congestive) heart failure (HCC) 12/20/2016   Chronic systolic heart failure (HCC) 08/02/2016   ICD (implantable cardioverter-defibrillator) in place 08/02/2016   S/P CABG x 5 08/16/2015   NSTEMI (non-ST elevated myocardial infarction) (HCC) 06/15/2015   Hypertension    CAD (coronary artery disease)    Blood glucose elevated    Dyslipidemia    Back pain    Gout    DYSLIPIDEMIA 06/16/2009   Essential hypertension 06/16/2009   CAD 06/16/2009   SHORTNESS OF BREATH 06/16/2009   Home Medication(s) Prior to Admission medications   Medication Sig Start Date End Date Taking? Authorizing Provider  acetaminophen (TYLENOL) 500 MG tablet Take 1,000 mg by mouth 2 (two) times daily as needed for headache.   Yes [provider]  diclofenac Sodium (VOLTAREN) 1 % GEL Apply 4 g topically daily as needed for pain. 07/05/21  Yes [provider]  dutasteride (AVODART) 0.5 MG capsule Take 0.5 mg by mouth daily. 02/10/23  Yes [provider]  furosemide (LASIX) 20 MG tablet Take 20 mg by mouth daily as needed for fluid. 04/08/21  Yes [provider]  gabapentin (NEURONTIN) 600 MG tablet Take 600 mg by mouth 4 (four) times daily. 11/03/16  Yes [provider]  glipiZIDE (GLUCOTROL XL) 2.5 MG 24 hr tablet Take  2.5 mg by mouth daily. 12/27/22  Yes [provider]  HYDROcodone-acetaminophen (NORCO) 10-325 MG tablet Take 1 tablet by mouth in the morning, at noon, in the evening, and at bedtime. 05/28/15  Yes [provider]  losartan (COZAAR) 100 MG tablet Take 100 mg by mouth daily.   Yes [provider]  metFORMIN (GLUCOPHAGE-XR) 500 MG 24 hr tablet Take 500 mg by mouth 2 (two) times daily.  02/15/16  Yes [provider]  metoprolol succinate (TOPROL-XL) 25 MG 24 hr tablet Take 25 mg by mouth daily. 02/10/23  Yes [provider]  NARCAN 4 MG/0.1ML LIQD nasal  spray kit Place 1 spray into the nose daily as needed (Overdose reverse). 03/05/17  Yes [provider]  pantoprazole (PROTONIX) 40 MG tablet Take 40 mg by mouth daily. 12/27/22  Yes [provider]  potassium chloride (KLOR-CON) 10 MEQ tablet Take 10 mEq by mouth daily. 12/27/22  Yes [provider]  pravastatin (PRAVACHOL) 40 MG tablet Take 40 mg by mouth daily.  06/21/15 02/19/23 Yes [provider]  QC LO-DOSE ASPIRIN 81 MG EC tablet Take 81 mg by mouth daily. 09/04/16  Yes [provider]  tamsulosin (FLOMAX) 0.4 MG CAPS capsule Take 0.4 mg by mouth daily after breakfast.  01/12/16  Yes [provider]                                                                                                                                    Past Surgical History Past Surgical History:  Procedure Laterality Date   BACK SURGERY     CARDIAC CATHETERIZATION  08/2005; 05/2010   Hattie Perch 08/09/2010; notes 05/29/2010   CARDIAC DEFIBRILLATOR PLACEMENT  12/20/2016   CORONARY ANGIOPLASTY WITH STENT PLACEMENT  08/2005   Hattie Perch 08/09/2010   CORONARY ARTERY BYPASS GRAFT  05/2015   CABG X5   ICD GENERATOR REMOVAL N/A 10/12/2016   Procedure: ICD Generator Removal;  Surgeon: Marinus Maw, MD;  Location: [facility] INVASIVE CV LAB;  Service: Cardiovascular;  Laterality: N/A;   ICD IMPLANT N/A 08/02/2016   Procedure: ICD Implant;  Surgeon: Marinus Maw, MD;  Location: [facility] INVASIVE CV LAB;  Service: Cardiovascular;  Laterality: N/A;   ICD IMPLANT N/A 12/20/2016   Procedure: ICD Implant;  Surgeon: Marinus Maw, MD;  Location: [facility] INVASIVE CV LAB;  Service: Cardiovascular;  Laterality: N/A;   ICD LEAD REMOVAL  10/12/2016   Procedure: Icd Lead Removal;  Surgeon: Marinus Maw, MD;  Location: [facility] INVASIVE CV LAB;  Service: Cardiovascular;;   LUMBAR DISC SURGERY  X 3   Family History Family History  Problem Relation Age of Onset   Coronary artery disease Father     Social  History Social History   Tobacco Use   Smoking status: Former    Current packs/day: 0.00    Average packs/day: 2.0 packs/day for 15.0 years (30.0 ttl pk-yrs)  Types: Cigarettes    Start date: 09/25/1958    Quit date: 09/24/1973    Years since quitting: 49.4   Smokeless tobacco: Current    Types: Snuff   Tobacco comments:    12/20/2016 "I have been dipping snuff since I was ~  72 years old"  Vaping Use   Vaping status: Never Used  Substance Use Topics   Alcohol use: No   Drug use: No   Allergies Patient has no known allergies.  Review of Systems Review of Systems  Respiratory:  Positive for shortness of breath.   Psychiatric/Behavioral:  Positive for confusion.     Physical Exam Vital Signs  I have reviewed the triage vital signs BP (!) 143/127 (BP Location: Left Arm)   Pulse 83   Temp 98.2 F (36.8 C)   Resp 18   Ht 6\' 1"  (1.854 m)   Wt 93.7 kg   SpO2 96%   BMI 27.25 kg/m   Physical Exam Constitutional:      General: He is not in acute distress.    Appearance: Normal appearance.  HENT:     Head: Normocephalic and atraumatic.     Nose: No congestion or rhinorrhea.  Eyes:     General:        Right eye: No discharge.        Left eye: No discharge.     Extraocular Movements: Extraocular movements intact.     Pupils: Pupils are equal, round, and reactive to light.  Cardiovascular:     Rate and Rhythm: Normal rate and regular rhythm.     Heart sounds: No murmur heard. Pulmonary:     Effort: No respiratory distress.     Breath sounds: Rales present. No wheezing.  Abdominal:     General: There is no distension.     Tenderness: There is no abdominal tenderness.  Musculoskeletal:        General: Normal range of motion.     Cervical back: Normal range of motion.  Skin:    General: Skin is warm and dry.  Neurological:     General: No focal deficit present.     Mental Status: He is alert.     ED Results and Treatments Labs (all labs ordered are listed,  but only abnormal results are displayed) Labs Reviewed  COMPREHENSIVE METABOLIC PANEL - Abnormal; Notable for the following components:      Result Value   CO2 20 (*)    Glucose, Bld 266 (*)    BUN 32 (*)    Creatinine, Ser 1.30 (*)    Total Bilirubin 1.9 (*)    GFR, Estimated 59 (*)    All other components within normal limits  CBC WITH DIFFERENTIAL/PLATELET - Abnormal; Notable for the following components:   WBC 12.1 (*)    Platelets 149 (*)    Neutro Abs 10.8 (*)    Abs Immature Granulocytes 0.09 (*)    All other components within normal limits  BRAIN NATRIURETIC PEPTIDE - Abnormal; Notable for the following components:   B Natriuretic Peptide 756.0 (*)    All other components within normal limits  BASIC METABOLIC PANEL - Abnormal; Notable for the following components:   Potassium 3.3 (*)    Glucose, Bld 264 (*)    BUN 37 (*)    Creatinine, Ser 1.45 (*)    GFR, Estimated 52 (*)    All other components within normal limits  CBC WITH DIFFERENTIAL/PLATELET - Abnormal; Notable for the following components:  WBC 10.6 (*)    Neutro Abs 9.1 (*)    Abs Immature Granulocytes 0.13 (*)    All other components within normal limits  LIPID PANEL - Abnormal; Notable for the following components:   Triglycerides 155 (*)    HDL 27 (*)    All other components within normal limits  GLUCOSE, CAPILLARY - Abnormal; Notable for the following components:   Glucose-Capillary 218 (*)    All other components within normal limits  CBG MONITORING, ED - Abnormal; Notable for the following components:   Glucose-Capillary 277 (*)    All other components within normal limits  CBG MONITORING, ED - Abnormal; Notable for the following components:   Glucose-Capillary 278 (*)    All other components within normal limits  TROPONIN I (HIGH SENSITIVITY) - Abnormal; Notable for the following components:   Troponin I (High Sensitivity) 42 (*)    All other components within normal limits  TROPONIN I (HIGH  SENSITIVITY) - Abnormal; Notable for the following components:   Troponin I (High Sensitivity) 40 (*)    All other components within normal limits  HIV ANTIBODY (ROUTINE TESTING W REFLEX)  HEMOGLOBIN A1C                                                                                                                          Radiology CT Head Wo Contrast  Result Date: 02/18/2023 CLINICAL DATA:  Head trauma, minor (Age >= 65y); Neck trauma (Age >= 65y) EXAM: CT HEAD WITHOUT CONTRAST CT CERVICAL SPINE WITHOUT CONTRAST TECHNIQUE: Multidetector CT imaging of the head and cervical spine was performed following the standard protocol without intravenous contrast. Multiplanar CT image reconstructions of the cervical spine were also generated. RADIATION DOSE REDUCTION: This exam was performed according to the departmental dose-optimization program which includes automated exposure control, adjustment of the mA and/or kV according to patient size and/or use of iterative reconstruction technique. COMPARISON:  None Available. FINDINGS: CT HEAD FINDINGS Brain: No hemorrhage. No hydrocephalus. No extra-axial fluid collection. No CT evidence of an acute cortical infarct. No mass effect. No mass lesion. Vascular: No hyperdense vessel or unexpected calcification. Skull: Soft tissue swelling along the right posterior scalp. No evidence of underlying calvarial fracture. Sinuses/Orbits: No middle ear or mastoid effusion. Paranasal sinuses are clear. Orbits are unremarkable. Other: None. CT CERVICAL SPINE FINDINGS Alignment: Straightening of the normal cervical lordosis. Skull base and vertebrae: No acute fracture. No primary bone lesion or focal pathologic process. Soft tissues and spinal canal: No prevertebral fluid or swelling. No visible canal hematoma. Disc levels:  No CT evidence of high-grade spinal canal stenosis. Upper chest: Negative. Other: There is a 2.7 cm right thyroid nodule. Recommend further evaluation with  dedicated thyroid ultrasound, if not previously performed. IMPRESSION: 1. No CT evidence of intracranial injury. 2. Soft tissue swelling along the right posterior scalp. No evidence of underlying calvarial fracture. 3. No acute fracture or traumatic subluxation of the cervical spine. 4. There is a  2.7 cm right thyroid nodule. Recommend further evaluation with dedicated thyroid ultrasound, if not previously performed. Electronically Signed   By: Lorenza Cambridge M.D.   On: 02/18/2023 18:29   CT Cervical Spine Wo Contrast  Result Date: 02/18/2023 CLINICAL DATA:  Head trauma, minor (Age >= 65y); Neck trauma (Age >= 65y) EXAM: CT HEAD WITHOUT CONTRAST CT CERVICAL SPINE WITHOUT CONTRAST TECHNIQUE: Multidetector CT imaging of the head and cervical spine was performed following the standard protocol without intravenous contrast. Multiplanar CT image reconstructions of the cervical spine were also generated. RADIATION DOSE REDUCTION: This exam was performed according to the departmental dose-optimization program which includes automated exposure control, adjustment of the mA and/or kV according to patient size and/or use of iterative reconstruction technique. COMPARISON:  None Available. FINDINGS: CT HEAD FINDINGS Brain: No hemorrhage. No hydrocephalus. No extra-axial fluid collection. No CT evidence of an acute cortical infarct. No mass effect. No mass lesion. Vascular: No hyperdense vessel or unexpected calcification. Skull: Soft tissue swelling along the right posterior scalp. No evidence of underlying calvarial fracture. Sinuses/Orbits: No middle ear or mastoid effusion. Paranasal sinuses are clear. Orbits are unremarkable. Other: None. CT CERVICAL SPINE FINDINGS Alignment: Straightening of the normal cervical lordosis. Skull base and vertebrae: No acute fracture. No primary bone lesion or focal pathologic process. Soft tissues and spinal canal: No prevertebral fluid or swelling. No visible canal hematoma. Disc  levels:  No CT evidence of high-grade spinal canal stenosis. Upper chest: Negative. Other: There is a 2.7 cm right thyroid nodule. Recommend further evaluation with dedicated thyroid ultrasound, if not previously performed. IMPRESSION: 1. No CT evidence of intracranial injury. 2. Soft tissue swelling along the right posterior scalp. No evidence of underlying calvarial fracture. 3. No acute fracture or traumatic subluxation of the cervical spine. 4. There is a 2.7 cm right thyroid nodule. Recommend further evaluation with dedicated thyroid ultrasound, if not previously performed. Electronically Signed   By: Lorenza Cambridge M.D.   On: 02/18/2023 18:29   DG Chest 2 View  Result Date: 02/18/2023 CLINICAL DATA:  Shortness of breath EXAM: CHEST - 2 VIEW COMPARISON:  12/21/2016 FINDINGS: Right-sided single lead pacing device. Post sternotomy changes. No pleural effusion or pneumothorax. Streaky left lung base opacity. Numerous calcified lung nodules and calcified hilar nodes consistent with prior granulomatous disease. Subsegmental atelectasis at the bases. IMPRESSION: 1. Streaky left lung base opacity, atelectasis versus pneumonia. 2. Evidence of prior granulomatous disease. Electronically Signed   By: Jasmine Pang M.D.   On: 02/18/2023 17:35    Pertinent labs & imaging results that were available during my care of the patient were reviewed by me and considered in my medical decision making (see MDM for details).  Medications Ordered in ED Medications  azithromycin (ZITHROMAX) 500 mg in sodium chloride 0.9 % 250 mL IVPB (0 mg Intravenous Stopped 02/18/23 2149)  carvedilol (COREG) tablet 18.75 mg (18.75 mg Oral Given 02/19/23 0920)  aspirin EC tablet 81 mg (81 mg Oral Given 02/19/23 0920)  losartan (COZAAR) tablet 50 mg (50 mg Oral Given 02/19/23 0922)  pravastatin (PRAVACHOL) tablet 40 mg (40 mg Oral Given 02/19/23 0921)  tamsulosin (FLOMAX) capsule 0.4 mg (0.4 mg Oral Given 02/19/23 0803)  sodium chloride  flush (NS) 0.9 % injection 3 mL (3 mLs Intravenous Given 02/19/23 0924)  sodium chloride flush (NS) 0.9 % injection 3 mL (has no administration in time range)  0.9 %  sodium chloride infusion (has no administration in time range)  acetaminophen (TYLENOL) tablet 650  mg (has no administration in time range)  ondansetron (ZOFRAN) injection 4 mg (4 mg Intravenous Given 02/18/23 2054)  enoxaparin (LOVENOX) injection 40 mg (40 mg Subcutaneous Given 02/18/23 2057)  cefTRIAXone (ROCEPHIN) 1 g in sodium chloride 0.9 % 100 mL IVPB (has no administration in time range)  insulin aspart (novoLOG) injection 0-9 Units (5 Units Subcutaneous Given 02/19/23 0754)  insulin aspart (novoLOG) injection 0-5 Units (3 Units Subcutaneous Given 02/18/23 2109)  ipratropium-albuterol (DUONEB) 0.5-2.5 (3) MG/3ML nebulizer solution 3 mL (has no administration in time range)  dextromethorphan-guaiFENesin (MUCINEX DM) 30-600 MG per 12 hr tablet 1 tablet (has no administration in time range)  furosemide (LASIX) injection 40 mg (40 mg Intravenous Given 02/18/23 1924)  cefTRIAXone (ROCEPHIN) 2 g in sodium chloride 0.9 % 100 mL IVPB (0 g Intravenous Stopped 02/18/23 2057)  potassium chloride SA (KLOR-CON M) CR tablet 40 mEq (40 mEq Oral Given 02/19/23 0922)  magnesium sulfate IVPB 1 g 100 mL (0 g Intravenous Stopped 02/19/23 1029)                                                                                                                                     Procedures Procedures  (including critical care time)  Medical Decision Making / ED Course   This patient presents to the ED for concern of fall, shortness of breath, confusion, this involves an extensive number of treatment options, and is a complaint that carries with it a high risk of complications and morbidity.  The differential diagnosis includes Pe, PTX, Pulmonary Edema, ARDS, COPD/Asthma, ACS, CHF exacerbation, Arrhythmia, Pericardial Effusion/Tamponade, Anemia,  Sepsis, Acidosis/Hypercapnia, Anxiety, Viral URI, closed head injury, fracture, ICH  MDM: Patient seen emergency room for evaluation of multiple complaints as described above.  Physical exam with Rales on lung exam, disoriented patient.  Laboratory evaluation with a leukocytosis to 12.1, BUN 32, creatinine 1.3, BNP significantly elevated at 756, high-sensitivity troponin 40.  ECG nonischemic with sinus tachycardia and multiple PVCs.  Trauma imaging include CT head and C-spine reassuringly unremarkable.  Chest x-ray with a left lung base opacity and ceftriaxone azithromycin ordered.  I spoke with the cardiologist on-call to help arrange inpatient consultation with cardiology tomorrow morning as I do have concern that perhaps his EF has not fully recovered or has worsened.  Lasix initiated and patient require hospital admission for CHF and AMS.   Additional history obtained:  -External records from outside source obtained and reviewed including: Chart review including previous notes, labs, imaging, consultation notes   Lab Tests: -I ordered, reviewed, and interpreted labs.   The pertinent results include:   Labs Reviewed  COMPREHENSIVE METABOLIC PANEL - Abnormal; Notable for the following components:      Result Value   CO2 20 (*)    Glucose, Bld 266 (*)    BUN 32 (*)    Creatinine, Ser 1.30 (*)    Total Bilirubin 1.9 (*)  GFR, Estimated 59 (*)    All other components within normal limits  CBC WITH DIFFERENTIAL/PLATELET - Abnormal; Notable for the following components:   WBC 12.1 (*)    Platelets 149 (*)    Neutro Abs 10.8 (*)    Abs Immature Granulocytes 0.09 (*)    All other components within normal limits  BRAIN NATRIURETIC PEPTIDE - Abnormal; Notable for the following components:   B Natriuretic Peptide 756.0 (*)    All other components within normal limits  BASIC METABOLIC PANEL - Abnormal; Notable for the following components:   Potassium 3.3 (*)    Glucose, Bld 264 (*)     BUN 37 (*)    Creatinine, Ser 1.45 (*)    GFR, Estimated 52 (*)    All other components within normal limits  CBC WITH DIFFERENTIAL/PLATELET - Abnormal; Notable for the following components:   WBC 10.6 (*)    Neutro Abs 9.1 (*)    Abs Immature Granulocytes 0.13 (*)    All other components within normal limits  LIPID PANEL - Abnormal; Notable for the following components:   Triglycerides 155 (*)    HDL 27 (*)    All other components within normal limits  GLUCOSE, CAPILLARY - Abnormal; Notable for the following components:   Glucose-Capillary 218 (*)    All other components within normal limits  CBG MONITORING, ED - Abnormal; Notable for the following components:   Glucose-Capillary 277 (*)    All other components within normal limits  CBG MONITORING, ED - Abnormal; Notable for the following components:   Glucose-Capillary 278 (*)    All other components within normal limits  TROPONIN I (HIGH SENSITIVITY) - Abnormal; Notable for the following components:   Troponin I (High Sensitivity) 42 (*)    All other components within normal limits  TROPONIN I (HIGH SENSITIVITY) - Abnormal; Notable for the following components:   Troponin I (High Sensitivity) 40 (*)    All other components within normal limits  HIV ANTIBODY (ROUTINE TESTING W REFLEX)  HEMOGLOBIN A1C      EKG   EKG Interpretation Date/Time:  Sunday February 18 2023 16:26:58 EST Ventricular Rate:  118 PR Interval:  156 QRS Duration:  96 QT Interval:  338 QTC Calculation: 473 R Axis:   37  Text Interpretation: Sinus tachycardia with occasional Premature ventricular complexes and Fusion complexes Right atrial enlargement Abnormal ECG Confirmed by Pepe Mineau ([id number]) on 02/18/2023 7:24:28 PM         Imaging Studies ordered: I ordered imaging studies including chest x-ray, CT head and C-spine I independently visualized and interpreted imaging. I agree with the radiologist interpretation   Medicines ordered and  prescription drug management: Meds ordered this encounter  Medications   furosemide (LASIX) injection 40 mg   cefTRIAXone (ROCEPHIN) 2 g in sodium chloride 0.9 % 100 mL IVPB    Order Specific Question:   Antibiotic Indication:    Answer:   CAP   azithromycin (ZITHROMAX) 500 mg in sodium chloride 0.9 % 250 mL IVPB    Order Specific Question:   Antibiotic Indication:    Answer:   CAP   carvedilol (COREG) tablet 18.75 mg   aspirin EC tablet 81 mg   losartan (COZAAR) tablet 50 mg   pravastatin (PRAVACHOL) tablet 40 mg   tamsulosin (FLOMAX) capsule 0.4 mg   sodium chloride flush (NS) 0.9 % injection 3 mL   sodium chloride flush (NS) 0.9 % injection 3 mL   0.9 %  sodium chloride infusion   acetaminophen (TYLENOL) tablet 650 mg   ondansetron (ZOFRAN) injection 4 mg   enoxaparin (LOVENOX) injection 40 mg   DISCONTD: furosemide (LASIX) injection 40 mg   cefTRIAXone (ROCEPHIN) 1 g in sodium chloride 0.9 % 100 mL IVPB    Order Specific Question:   Antibiotic Indication:    Answer:   CAP   insulin aspart (novoLOG) injection 0-9 Units    Order Specific Question:   Correction coverage:    Answer:   Sensitive (thin, NPO, renal)    Order Specific Question:   CBG < 70:    Answer:   Implement Hypoglycemia Standing Orders and refer to Hypoglycemia Standing Orders sidebar report    Order Specific Question:   CBG 70 - 120:    Answer:   0 units    Order Specific Question:   CBG 121 - 150:    Answer:   1 unit    Order Specific Question:   CBG 151 - 200:    Answer:   2 units    Order Specific Question:   CBG 201 - 250:    Answer:   3 units    Order Specific Question:   CBG 251 - 300:    Answer:   5 units    Order Specific Question:   CBG 301 - 350:    Answer:   7 units    Order Specific Question:   CBG 351 - 400    Answer:   9 units    Order Specific Question:   CBG > 400    Answer:   call MD and obtain STAT lab verification   insulin aspart (novoLOG) injection 0-5 Units    Order Specific  Question:   Correction coverage:    Answer:   HS scale    Order Specific Question:   CBG < 70:    Answer:   Implement Hypoglycemia Standing Orders and refer to Hypoglycemia Standing Orders sidebar report    Order Specific Question:   CBG 70 - 120:    Answer:   0 units    Order Specific Question:   CBG 121 - 150:    Answer:   0 units    Order Specific Question:   CBG 151 - 200:    Answer:   0 units    Order Specific Question:   CBG 201 - 250:    Answer:   2 units    Order Specific Question:   CBG 251 - 300:    Answer:   3 units    Order Specific Question:   CBG 301 - 350:    Answer:   4 units    Order Specific Question:   CBG 351 - 400:    Answer:   5 units    Order Specific Question:   CBG > 400    Answer:   call MD and obtain STAT lab verification   potassium chloride SA (KLOR-CON M) CR tablet 40 mEq   magnesium sulfate IVPB 1 g 100 mL   ipratropium-albuterol (DUONEB) 0.5-2.5 (3) MG/3ML nebulizer solution 3 mL   dextromethorphan-guaiFENesin (MUCINEX DM) 30-600 MG per 12 hr tablet 1 tablet    -I have reviewed the patients home medicines and have made adjustments as needed  Critical interventions none  Consultations Obtained: I requested consultation with the cardiologist on-call,  and discussed lab and imaging findings as well as pertinent plan - they recommend: Inpatient evaluation   Cardiac Monitoring: The  patient was maintained on a cardiac monitor.  I personally viewed and interpreted the cardiac monitored which showed an underlying rhythm of:  NSr  Social Determinants of Health:  Factors impacting patients care include: none   Reevaluation: After the interventions noted above, I reevaluated the patient and found that they have :stayed the same  Co morbidities that complicate the patient evaluation  Past Medical History:  Diagnosis Date   AICD (automatic cardioverter/defibrillator) present 08/02/2016   a. 07/2016 biotronik-->req removal in 09/2016 and replacement in  11/2016 due to stitch abscess.  No plan to replace due to improved EF.   Arthritis    " In my hands , back, feet " (12/20/2016)   Blood glucose elevated    March, 2012   CAD (coronary artery disease)    a. 2007 PCI/DES x 2 LAD; b. 05/2010 Cath: LM 10d, LAD 73m, 80m ISR, 80-90d, D2 20ost, D3 80, small, LCX nondom, nl, RI 77m, RCA large, 20p, 71m, RPDA 40p, RPL1 90ost, RPL2 80-->Med rx; c. 05/2015 CABG x 5 ([facility]).   CKD (chronic kidney disease), stage III (HCC)    Dyslipidemia    Gout    April, 201 to   Heart failure with improved ejection fraction (HFimpEF) (HCC)    a. 01/2016 Echo: EF 20%; b. 06/2016 Echo: EF 30%; c. 08/2021 Echo: EF 50-55%. mild LVH. GrI DD. nl RV fxn. Sev dil LA. Triv MR.   Hypertension    Ischemic cardiomyopathy    a. 01/2016 Echo: EF 20%; b. 06/2016 Echo: EF 30%; c. 07/2016 s/p Biotronik AICD->removed 09/2016 due to stitch abscess->replaced 11/2016; d. 08/2021 Echo: EF 50-55%.   SOB (shortness of breath)    Type II diabetes mellitus (HCC)       Dispostion: I considered admission for this patient, and patient require hospital admission for persistent altered mental status and CHF exacerbation     Final Clinical Impression(s) / ED Diagnoses Final diagnoses:  Acute on chronic congestive heart failure, unspecified heart failure type [facility])     @PCDICTATION @    Glendora Score, MD 02/19/23 1201

## 2023-02-19 NOTE — Discharge Instructions (Addendum)
1)Avoid ibuprofen/Advil/Aleve/Motrin/Goody Powders/Naproxen/BC powders/Meloxicam/Diclofenac/Indomethacin and other Nonsteroidal anti-inflammatory medications as these will make you more likely to bleed and can cause stomach ulcers, can also cause Kidney problems.   2)Very low-salt diet advised--less than 2 g of sodium per day advised  3)Weigh yourself daily, call if you gain more than 3 pounds in 1 day or more than 5 pounds in 1 week as your diuretic medications may need to be adjusted

## 2023-02-20 DIAGNOSIS — R42 Dizziness and giddiness: Secondary | ICD-10-CM | POA: Diagnosis not present

## 2023-02-20 DIAGNOSIS — J189 Pneumonia, unspecified organism: Secondary | ICD-10-CM | POA: Diagnosis not present

## 2023-02-20 DIAGNOSIS — I509 Heart failure, unspecified: Secondary | ICD-10-CM | POA: Diagnosis not present

## 2023-02-20 DIAGNOSIS — I5023 Acute on chronic systolic (congestive) heart failure: Secondary | ICD-10-CM | POA: Diagnosis not present

## 2023-02-20 LAB — GLUCOSE, CAPILLARY
Glucose-Capillary: 170 mg/dL — ABNORMAL HIGH (ref 70–99)
Glucose-Capillary: 171 mg/dL — ABNORMAL HIGH (ref 70–99)
Glucose-Capillary: 165 mg/dL — ABNORMAL HIGH (ref 70–99)
Glucose-Capillary: 147 mg/dL — ABNORMAL HIGH (ref 70–99)

## 2023-02-20 LAB — BASIC METABOLIC PANEL
Anion gap: 13 (ref 5–15)
BUN: 41 mg/dL — ABNORMAL HIGH (ref 8–23)
CO2: 25 mmol/L (ref 22–32)
Calcium: 8.5 mg/dL — ABNORMAL LOW (ref 8.9–10.3)
Chloride: 99 mmol/L (ref 98–111)
Creatinine, Ser: 1.29 mg/dL — ABNORMAL HIGH (ref 0.61–1.24)
GFR, Estimated: >60 mL/min (ref >60)
Glucose, Bld: 178 mg/dL — ABNORMAL HIGH (ref 70–99)
Potassium: 3.1 mmol/L — ABNORMAL LOW (ref 3.5–5.1)
Sodium: 137 mmol/L (ref 135–145)

## 2023-02-20 LAB — MAGNESIUM: Magnesium: 2.1 mg/dL (ref 1.7–2.4)

## 2023-02-20 MED ORDER — SACCHAROMYCES BOULARDII 250 MG PO CAPS
250.0000 mg | ORAL_CAPSULE | Freq: Two times a day (BID) | ORAL | Status: DC
  Administered 2023-02-20 – 2023-02-22 (×5): 250 mg via ORAL
  Filled 2023-02-20 (×5): qty 1

## 2023-02-20 MED ORDER — POTASSIUM CHLORIDE CRYS ER 20 MEQ PO TBCR
40.0000 meq | EXTENDED_RELEASE_TABLET | Freq: Once | ORAL | Status: AC
  Administered 2023-02-20: 40 meq via ORAL
  Filled 2023-02-20: qty 2

## 2023-02-20 MED ORDER — SODIUM CHLORIDE 0.9 % IV SOLN: INTRAVENOUS | Status: AC

## 2023-02-20 NOTE — Progress Notes (Signed)
Discussed Flutter usage with patient.  Had patient demonstrate back to me.  Patient did X10 with good effort.  Flutter left at bedside.

## 2023-02-20 NOTE — Evaluation (Signed)
Clinical/Bedside Swallow Evaluation Patient Details  Name: Anthony Adams MRN: [id number] Date of Birth: 10-Sep-1950  Today's Date: 02/20/2023 Time: SLP Start Time (ACUTE ONLY): 1330 SLP Stop Time (ACUTE ONLY): 1355 SLP Time Calculation (min) (ACUTE ONLY): 25 min  Past Medical History:  Past Medical History:  Diagnosis Date   AICD (automatic cardioverter/defibrillator) present 08/02/2016   a. 07/2016 biotronik-->req removal in 09/2016 and replacement in 11/2016 due to stitch abscess.  No plan to replace due to improved EF.   Arthritis    " In my hands , back, feet " (12/20/2016)   Blood glucose elevated    March, 2012   CAD (coronary artery disease)    a. 2007 PCI/DES x 2 LAD; b. 05/2010 Cath: LM 10d, LAD 26m, 55m ISR, 80-90d, D2 20ost, D3 80, small, LCX nondom, nl, RI 68m, RCA large, 20p, 5m, RPDA 40p, RPL1 90ost, RPL2 80-->Med rx; c. 05/2015 CABG x 5 ([facility]).   CKD (chronic kidney disease), stage III (HCC)    Dyslipidemia    Gout    April, 201 to   Heart failure with improved ejection fraction (HFimpEF) (HCC)    a. 01/2016 Echo: EF 20%; b. 06/2016 Echo: EF 30%; c. 08/2021 Echo: EF 50-55%. mild LVH. GrI DD. nl RV fxn. Sev dil LA. Triv MR.   Hypertension    Ischemic cardiomyopathy    a. 01/2016 Echo: EF 20%; b. 06/2016 Echo: EF 30%; c. 07/2016 s/p Biotronik AICD->removed 09/2016 due to stitch abscess->replaced 11/2016; d. 08/2021 Echo: EF 50-55%.   SOB (shortness of breath)    Type II diabetes mellitus [facility])    Past Surgical History:  Past Surgical History:  Procedure Laterality Date   BACK SURGERY     CARDIAC CATHETERIZATION  08/2005; 05/2010   Hattie Perch 08/09/2010; notes 05/29/2010   CARDIAC DEFIBRILLATOR PLACEMENT  12/20/2016   CORONARY ANGIOPLASTY WITH STENT PLACEMENT  08/2005   Hattie Perch 08/09/2010   CORONARY ARTERY BYPASS GRAFT  05/2015   CABG X5   ICD GENERATOR REMOVAL N/A 10/12/2016   Procedure: ICD Generator Removal;  Surgeon: Marinus Maw, MD;  Location: [facility] INVASIVE CV LAB;  Service:  Cardiovascular;  Laterality: N/A;   ICD IMPLANT N/A 08/02/2016   Procedure: ICD Implant;  Surgeon: Marinus Maw, MD;  Location: [facility] INVASIVE CV LAB;  Service: Cardiovascular;  Laterality: N/A;   ICD IMPLANT N/A 12/20/2016   Procedure: ICD Implant;  Surgeon: Marinus Maw, MD;  Location: [facility] INVASIVE CV LAB;  Service: Cardiovascular;  Laterality: N/A;   ICD LEAD REMOVAL  10/12/2016   Procedure: Icd Lead Removal;  Surgeon: Marinus Maw, MD;  Location: [facility] INVASIVE CV LAB;  Service: Cardiovascular;;   LUMBAR DISC SURGERY  X 3   HPI:  As per H&P written by Dr.Sreeram on 02/18/2023  Anthony Adams is a 72 y.o. male with medical history significant of type 2 diabetes mellitus, hypertension, CAD status post CABG, chronic systolic heart failure, ischemic cardiomyopathy status post ICD (did not have battery replaced due to improved EF per cardiology note) presented to the emergency department for evaluation of a fall.  As per patient's sister patient fell early morning going to bathroom, has been confused since last several days.  Per sister, he has been having episodes of falling on his knees, back since last 1 month but did not seek any medical attention.  She also reports that he has been having shortness of breath, productive cough since last 2 days.  Patient denies any chest pain,  shortness of breath or palpitations, dizziness during the episodes.  No fever, chills or rigors. CT head showed no evidence of intracranial abnormality, cervical spine CT showed no evidence of fracture or subluxation.  A 2.7 cm right thyroid nodule noted recommended thyroid ultrasound.  Chest x-ray showed streaky left lung base opacity, atelectasis versus pneumonia, prior granulomatous disease. SLE ordered, however RN stated it should be BSE. BSE now ordered.    Assessment / Plan / Recommendation  Clinical Impression  Clinical swallow evaluation completed at bedside. Oral motor examination WNL with the exception of edentulous  status. Pt states that he does not wear dentures and he has no difficulty masticating all solids. He denies previous or current dysphagia and no reports of GERD or other esophageal concerns. Pt states that he is widowed and lives with his mother, two sisters, and a brother. He has 5 children who live in the vicinity. Pt assessed with ice chips, thin water via cup/straw, puree, and regular textures. Pt does not exhibit any signs or concerns of aspiration at bedside. He does present with hoarse vocal quality, which he says has been present for a week+. SLP reviewed standard aspiration and reflux precautions. Recommend regular textures and thin liquids with standard aspiration and reflux precautions. Pt ok for PO medication whole with water. No further SLP follow up indicated at this time. SLP Visit Diagnosis: Dysphagia, unspecified (R13.10)    Aspiration Risk  No limitations    Diet Recommendation Regular;Thin liquid    Liquid Administration via: Cup;Straw Medication Administration: Whole meds with liquid Supervision: Patient able to self feed Postural Changes: Seated upright at 90 degrees;Remain upright for at least 30 minutes after po intake    Other  Recommendations Oral Care Recommendations: Oral care BID;Patient independent with oral care    Recommendations for follow up therapy are one component of a multi-disciplinary discharge planning process, led by the attending physician.  Recommendations may be updated based on patient status, additional functional criteria and insurance authorization.  Follow up Recommendations No SLP follow up      Assistance Recommended at Discharge    Functional Status Assessment Patient has not had a recent decline in their functional status  Frequency and Duration            Prognosis Prognosis for improved oropharyngeal function: Good      Swallow Study   General Date of Onset: 02/18/23 HPI: As per H&P written by Dr.Sreeram on 02/18/2023  Anthony Adams is a 72 y.o. male with medical history significant of type 2 diabetes mellitus, hypertension, CAD status post CABG, chronic systolic heart failure, ischemic cardiomyopathy status post ICD (did not have battery replaced due to improved EF per cardiology note) presented to the emergency department for evaluation of a fall.  As per patient's sister patient fell early morning going to bathroom, has been confused since last several days.  Per sister, he has been having episodes of falling on his knees, back since last 1 month but did not seek any medical attention.  She also reports that he has been having shortness of breath, productive cough since last 2 days.  Patient denies any chest pain, shortness of breath or palpitations, dizziness during the episodes.  No fever, chills or rigors. CT head showed no evidence of intracranial abnormality, cervical spine CT showed no evidence of fracture or subluxation.  A 2.7 cm right thyroid nodule noted recommended thyroid ultrasound.  Chest x-ray showed streaky left lung base opacity, atelectasis versus pneumonia,  prior granulomatous disease. SLE ordered, however RN stated it should be BSE. BSE now ordered. Type of Study: Bedside Swallow Evaluation Previous Swallow Assessment: N/A Diet Prior to this Study: Regular;Thin liquids (Level 0) Temperature Spikes Noted: No Respiratory Status: Room air History of Recent Intubation: No Behavior/Cognition: Alert;Cooperative;Pleasant mood Oral Cavity Assessment: Within Functional Limits Oral Care Completed by SLP: No Oral Cavity - Dentition: Edentulous Vision: Functional for self-feeding Self-Feeding Abilities: Able to feed self Patient Positioning: Upright in bed Baseline Vocal Quality: Hoarse;Breathy Volitional Cough: Strong Volitional Swallow: Able to elicit    Oral/Motor/Sensory Function Overall Oral Motor/Sensory Function: Within functional limits   Ice Chips Ice chips: Within functional limits Presentation:  Spoon   Thin Liquid Thin Liquid: Within functional limits Presentation: Cup;Self Fed;Straw    Nectar Thick Nectar Thick Liquid: Not tested   Honey Thick Honey Thick Liquid: Not tested   Puree Puree: Within functional limits Presentation: Spoon   Solid     Solid: Within functional limits Presentation: Self Fed     Thank you,  Havery Moros, CCC-SLP [phone]  Anthony Adams 02/20/2023,2:17 PM

## 2023-02-20 NOTE — Progress Notes (Addendum)
Rounding Note    Patient Name: Anthony Adams Date of Encounter: 02/20/2023  [facility] Cardiologist: Lewayne Bunting, MD   Subjective   Fatigue this morning. Denies any significant SOB. Some ongoing diarrhea  Inpatient Medications    Scheduled Meds:  aspirin EC  81 mg Oral Daily   carvedilol  18.75 mg Oral BID   dextromethorphan-guaiFENesin  1 tablet Oral BID   enoxaparin (LOVENOX) injection  40 mg Subcutaneous Q24H   [START ON 02/21/2023] influenza vaccine adjuvanted  0.5 mL Intramuscular Once   insulin aspart  0-5 Units Subcutaneous QHS   insulin aspart  0-9 Units Subcutaneous TID WC   losartan  50 mg Oral Daily   pravastatin  40 mg Oral Daily   tamsulosin  0.4 mg Oral QPC breakfast   Continuous Infusions:  azithromycin Stopped (02/20/23 0116)   cefTRIAXone (ROCEPHIN)  IV Stopped (02/20/23 0116)   PRN Meds: acetaminophen, ipratropium-albuterol, ondansetron (ZOFRAN) IV, [START ON 02/21/2023] pneumococcal 20-valent conjugate vaccine   Vital Signs    Vitals:   02/20/23 0009 02/20/23 0407 02/20/23 0638 02/20/23 0736  BP: (!) 151/86 (!) 167/90  (!) 157/76  Pulse: 78 68  74  Resp:      Temp: 99.1 F (37.3 C) 99 F (37.2 C)  97.9 F (36.6 C)  TempSrc: Oral Oral    SpO2: 95% 95%  93%  Weight:   95.2 kg   Height:   6\' 1"  (1.854 m)     Intake/Output Summary (Last 24 hours) at 02/20/2023 1110 Last data filed at 02/20/2023 0700 Gross per 24 hour  Intake 1440.1 ml  Output 700 ml  Net 740.1 ml      02/20/2023    6:38 AM 02/19/2023   11:16 AM 02/18/2023    4:27 PM  Last 3 Weights  Weight (lbs) 209 lb 14.1 oz 206 lb 9.1 oz 217 lb  Weight (kg) 95.2 kg 93.7 kg 98.431 kg      Telemetry    SR, short run NSVT - Personally Reviewed  ECG    N/a - Personally Reviewed  Physical Exam   GEN: No acute distress.   Neck: No JVD Cardiac: RRR, no murmurs, rubs, or gallops.  Respiratory: Clear to auscultation bilaterally. GI: Soft, nontender,  non-distended  MS: No edema; No deformity. Neuro:  Nonfocal  Psych: Normal affect   Labs    High Sensitivity Troponin:   Recent Labs  Lab 02/18/23 1759 02/18/23 2007  TROPONINIHS 42* 40*     Chemistry Recent Labs  Lab 02/18/23 1759 02/19/23 0440 02/20/23 0403  NA 140 142 137  K 3.5 3.3* 3.1*  CL 105 104 99  CO2 20* 25 25  GLUCOSE 266* 264* 178*  BUN 32* 37* 41*  CREATININE 1.30* 1.45* 1.29*  CALCIUM 9.4 9.0 8.5*  MG  --   --  2.1  PROT 7.8  --   --   ALBUMIN 3.7  --   --   AST 24  --   --   ALT 15  --   --   ALKPHOS 47  --   --   BILITOT 1.9*  --   --   GFRNONAA 59* 52* >60  ANIONGAP 15 13 13     Lipids  Recent Labs  Lab 02/19/23 0440  CHOL 121  TRIG 155*  HDL 27*  LDLCALC 63  CHOLHDL 4.5    Hematology Recent Labs  Lab 02/18/23 1759 02/19/23 0440  WBC 12.1* 10.6*  RBC 5.12  4.76  HGB 14.4 13.5  HCT 42.6 40.4  MCV 83.2 84.9  MCH 28.1 28.4  MCHC 33.8 33.4  RDW 13.6 13.9  PLT 149* 153   Thyroid No results for input(s): "TSH", "FREET4" in the last 168 hours.  BNP Recent Labs  Lab 02/18/23 1759  BNP 756.0*    DDimer No results for input(s): "DDIMER" in the last 168 hours.   Radiology    ECHOCARDIOGRAM COMPLETE  Result Date: 02/19/2023    ECHOCARDIOGRAM REPORT   Patient Name:   Anthony Adams Date of Exam: 02/19/2023 Medical Rec #:  [id number]    Height:       73.0 in Accession #:    [id number]   Weight:       206.6 lb Date of Birth:  1950-08-10   BSA:          2.181 m Patient Age:    72 years     BP:           143/127 mmHg Patient Gender: M            HR:           73 bpm. [facility] Procedure: 2D Echo, Cardiac Doppler, Color Doppler and Intracardiac            Opacification Agent Indications:    Congestive Heart Failure I50.9  History:        Patient has prior history of Echocardiogram examinations, most                 recent 09/02/2021. CHF, CAD; Risk Factors:Hypertension and                 Diabetes.  Sonographer:    Darlys Gales  Referring Phys: [id number] Marcelino Duster IMPRESSIONS  1. Left ventricular ejection fraction, by estimation, is 55 to 60%. The left ventricle has normal function. Left ventricular endocardial border not optimally defined to evaluate regional wall motion. Left ventricular diastolic parameters are consistent with Grade I diastolic dysfunction (impaired relaxation).  2. Right ventricular systolic function is normal. The right ventricular size is normal. Tricuspid regurgitation signal is inadequate for assessing PA pressure.  3. Left atrial size was moderately dilated.  4. The mitral valve is normal in structure. No evidence of mitral valve regurgitation. No evidence of mitral stenosis.  5. The aortic valve is tricuspid. Aortic valve regurgitation is not visualized. No aortic stenosis is present. FINDINGS  Left Ventricle: Left ventricular ejection fraction, by estimation, is 55 to 60%. The left ventricle has normal function. Left ventricular endocardial border not optimally defined to evaluate regional wall motion. Definity contrast agent was given IV to delineate the left ventricular endocardial borders. The left ventricular internal cavity size was normal in size. There is no left ventricular hypertrophy. Left ventricular diastolic parameters are consistent with Grade I diastolic dysfunction (impaired relaxation). Normal left ventricular filling pressure. Right Ventricle: The right ventricular size is normal. Right vetricular wall thickness was not well visualized. Right ventricular systolic function is normal. Tricuspid regurgitation signal is inadequate for assessing PA pressure. Left Atrium: Left atrial size was moderately dilated. Right Atrium: Right atrial size was normal in size. Pericardium: There is no evidence of pericardial effusion. Mitral Valve: The mitral valve is normal in structure. No evidence of mitral valve regurgitation. No evidence of mitral valve stenosis. Tricuspid Valve: The tricuspid valve  is normal in structure. Tricuspid valve regurgitation is not demonstrated. No evidence of tricuspid stenosis. Aortic Valve: The  aortic valve is tricuspid. Aortic valve regurgitation is not visualized. No aortic stenosis is present. Aortic valve mean gradient measures 3.0 mmHg. Aortic valve peak gradient measures 4.7 mmHg. Aortic valve area, by VTI measures 2.67 cm. Pulmonic Valve: The pulmonic valve was not well visualized. Pulmonic valve regurgitation is not visualized. No evidence of pulmonic stenosis. Aorta: The aortic root was not well visualized. IAS/Shunts: The interatrial septum was not well visualized. Additional Comments: A device lead is visualized in the right atrium and right ventricle.  LEFT VENTRICLE PLAX 2D LVIDd:         5.10 cm   Diastology LVIDs:         4.00 cm   LV e' medial:    2.83 cm/s LV PW:         1.00 cm   LV E/e' medial:  15.6 LV IVS:        1.00 cm   LV e' lateral:   5.98 cm/s LVOT diam:     2.00 cm   LV E/e' lateral: 7.4 LV SV:         55 LV SV Index:   25 LVOT Area:     3.14 cm  RIGHT VENTRICLE RV S prime:     9.46 cm/s LEFT ATRIUM              Index        RIGHT ATRIUM           Index LA Vol (A2C):   97.3 ml  44.60 ml/m  RA Area:     19.40 cm LA Vol (A4C):   94.8 ml  43.46 ml/m  RA Volume:   46.80 ml  21.45 ml/m LA Biplane Vol: 100.0 ml 45.84 ml/m  AORTIC VALVE AV Area (Vmax):    3.20 cm AV Area (Vmean):   2.70 cm AV Area (VTI):     2.67 cm AV Vmax:           108.00 cm/s AV Vmean:          80.100 cm/s AV VTI:            0.207 m AV Peak Grad:      4.7 mmHg AV Mean Grad:      3.0 mmHg LVOT Vmax:         110.00 cm/s LVOT Vmean:        68.900 cm/s LVOT VTI:          0.176 m LVOT/AV VTI ratio: 0.85 MITRAL VALVE MV Area (PHT): 2.01 cm    SHUNTS MV Decel Time: 377 msec    Systemic VTI:  0.18 m MV E velocity: 44.10 cm/s  Systemic Diam: 2.00 cm MV A velocity: 87.00 cm/s MV E/A ratio:  0.51 Dina Rich MD Electronically signed by Dina Rich MD Signature Date/Time:  02/19/2023/5:06:50 PM    Final    US Carotid Bilateral  Result Date: 02/19/2023 CLINICAL DATA:  72 year old male with a history of syncope EXAM: BILATERAL CAROTID DUPLEX ULTRASOUND TECHNIQUE: Wallace Cullens scale imaging, color Doppler and duplex ultrasound were performed of bilateral carotid and vertebral arteries in the neck. COMPARISON:  None Available. FINDINGS: Criteria: Quantification of carotid stenosis is based on velocity parameters that correlate the residual internal carotid diameter with NASCET-based stenosis levels, using the diameter of the distal internal carotid lumen as the denominator for stenosis measurement. The following velocity measurements were obtained: RIGHT ICA:  Systolic 112 cm/sec, Diastolic 23 cm/sec CCA:  73 cm/sec SYSTOLIC ICA/CCA RATIO:  1.7  ECA:  99 cm/sec LEFT ICA:  Systolic 83 cm/sec, Diastolic 18 cm/sec CCA:  97 cm/sec SYSTOLIC ICA/CCA RATIO:  0.9 ECA:  93 cm/sec Right Brachial SBP: Not acquired Left Brachial SBP: Not acquired RIGHT CAROTID ARTERY: No significant calcified disease of the right common carotid artery. Intermediate waveform maintained. Heterogeneous plaque without significant calcifications at the right carotid bifurcation. Low resistance waveform of the right ICA. No significant tortuosity. RIGHT VERTEBRAL ARTERY: Antegrade flow with low resistance waveform. LEFT CAROTID ARTERY: No significant calcified disease of the left common carotid artery. Intermediate waveform maintained. Heterogeneous plaque at the left carotid bifurcation without significant calcifications. Low resistance waveform of the left ICA. LEFT VERTEBRAL ARTERY:  Antegrade flow with low resistance waveform. IMPRESSION: Color duplex indicates minimal heterogeneous plaque, with no hemodynamically significant stenosis by duplex criteria in the extracranial cerebrovascular circulation. Signed, Yvone Neu. Miachel Roux, RPVI [facility]  Electronically Signed   By: Gilmer Mor D.O.   On: 02/19/2023 14:08   CT Head Wo Contrast  Result Date: 02/18/2023 CLINICAL DATA:  Head trauma, minor (Age >= 65y); Neck trauma (Age >= 65y) EXAM: CT HEAD WITHOUT CONTRAST CT CERVICAL SPINE WITHOUT CONTRAST TECHNIQUE: Multidetector CT imaging of the head and cervical spine was performed following the standard protocol without intravenous contrast. Multiplanar CT image reconstructions of the cervical spine were also generated. RADIATION DOSE REDUCTION: This exam was performed according to the departmental dose-optimization program which includes automated exposure control, adjustment of the mA and/or kV according to patient size and/or use of iterative reconstruction technique. COMPARISON:  None Available. FINDINGS: CT HEAD FINDINGS Brain: No hemorrhage. No hydrocephalus. No extra-axial fluid collection. No CT evidence of an acute cortical infarct. No mass effect. No mass lesion. Vascular: No hyperdense vessel or unexpected calcification. Skull: Soft tissue swelling along the right posterior scalp. No evidence of underlying calvarial fracture. Sinuses/Orbits: No middle ear or mastoid effusion. Paranasal sinuses are clear. Orbits are unremarkable. Other: None. CT CERVICAL SPINE FINDINGS Alignment: Straightening of the normal cervical lordosis. Skull base and vertebrae: No acute fracture. No primary bone lesion or focal pathologic process. Soft tissues and spinal canal: No prevertebral fluid or swelling. No visible canal hematoma. Disc levels:  No CT evidence of high-grade spinal canal stenosis. Upper chest: Negative. Other: There is a 2.7 cm right thyroid nodule. Recommend further evaluation with dedicated thyroid ultrasound, if not previously performed. IMPRESSION: 1. No CT evidence of intracranial injury. 2. Soft tissue swelling along the right posterior scalp. No evidence of underlying calvarial fracture. 3. No acute fracture or traumatic subluxation of the  cervical spine. 4. There is a 2.7 cm right thyroid nodule. Recommend further evaluation with dedicated thyroid ultrasound, if not previously performed. Electronically Signed   By: Lorenza Cambridge M.D.   On: 02/18/2023 18:29   CT Cervical Spine Wo Contrast  Result Date: 02/18/2023 CLINICAL DATA:  Head trauma, minor (Age >= 65y); Neck trauma (Age >= 65y) EXAM: CT HEAD WITHOUT CONTRAST CT CERVICAL SPINE WITHOUT CONTRAST TECHNIQUE: Multidetector CT imaging of the head and cervical spine was performed following the standard protocol without intravenous contrast. Multiplanar CT image reconstructions of the cervical spine were also generated. RADIATION DOSE REDUCTION: This exam was performed according to the departmental dose-optimization program which includes automated exposure control, adjustment of the mA and/or kV according to patient size and/or use of iterative reconstruction technique. COMPARISON:  None Available. FINDINGS: CT HEAD FINDINGS Brain: No hemorrhage. No hydrocephalus. No extra-axial fluid collection. No  CT evidence of an acute cortical infarct. No mass effect. No mass lesion. Vascular: No hyperdense vessel or unexpected calcification. Skull: Soft tissue swelling along the right posterior scalp. No evidence of underlying calvarial fracture. Sinuses/Orbits: No middle ear or mastoid effusion. Paranasal sinuses are clear. Orbits are unremarkable. Other: None. CT CERVICAL SPINE FINDINGS Alignment: Straightening of the normal cervical lordosis. Skull base and vertebrae: No acute fracture. No primary bone lesion or focal pathologic process. Soft tissues and spinal canal: No prevertebral fluid or swelling. No visible canal hematoma. Disc levels:  No CT evidence of high-grade spinal canal stenosis. Upper chest: Negative. Other: There is a 2.7 cm right thyroid nodule. Recommend further evaluation with dedicated thyroid ultrasound, if not previously performed. IMPRESSION: 1. No CT evidence of intracranial  injury. 2. Soft tissue swelling along the right posterior scalp. No evidence of underlying calvarial fracture. 3. No acute fracture or traumatic subluxation of the cervical spine. 4. There is a 2.7 cm right thyroid nodule. Recommend further evaluation with dedicated thyroid ultrasound, if not previously performed. Electronically Signed   By: Lorenza Cambridge M.D.   On: 02/18/2023 18:29   DG Chest 2 View  Result Date: 02/18/2023 CLINICAL DATA:  Shortness of breath EXAM: CHEST - 2 VIEW COMPARISON:  12/21/2016 FINDINGS: Right-sided single lead pacing device. Post sternotomy changes. No pleural effusion or pneumothorax. Streaky left lung base opacity. Numerous calcified lung nodules and calcified hilar nodes consistent with prior granulomatous disease. Subsegmental atelectasis at the bases. IMPRESSION: 1. Streaky left lung base opacity, atelectasis versus pneumonia. 2. Evidence of prior granulomatous disease. Electronically Signed   By: Jasmine Pang M.D.   On: 02/18/2023 17:35    Cardiac Studies    Patient Profile     Anthony Adams is a 72 y.o. male with a history of  CAD status post prior LAD stenting and CABG x 5, ischemic cardiomyopathy, chronic heart failure with improved ejection fraction, diabetes, hypertension, hyperlipidemia, and stage III chronic kidney disease, who is being seen today for the evaluation of dyspnea/resp failure/CHF at the request of Dr. Gwenlyn [facility].   Assessment & Plan    1.Dizziness/falls - dizziness/ falls only with standing. Poor oral intake per family over last few days, along with some frequent diarrhea.  - he was orthostatic yesteday by DBP, dropped 14 points - overall presentation consistent with orthostatic dizziness.  - give back IVF NS.      2. CAP - abx per primary team   3. CAD - mild flat trop in setting of pneumonia not consistent with ACS. EKG without acute ischemic changes.    4. Chronic HFimpEF - 08/2021 echo: LVEF 50-55%, grade I dd, normal RV -  01/2023 echo: LVE 55-60%, grade I dd, normal RV function - CXR with pneumona, BNP 756. Further uptrend in Cr/BUN with diuretics yetserday, would hold - continue to hold home diuretic, would change to prn at discharge.  -reds vest today 28% suggesting euvolemia  5. NSVT K down to 3.1, Mg was 2.1. Replace KCl - continue coreg.    No additional cardiology recs, we will sign off inpatient care and arrange outpatient f/u   For questions or updates, please contact [facility] Please consult [email or web address] for contact info under        Signed, Dina Rich, MD  02/20/2023, 11:10 AM

## 2023-02-20 NOTE — Progress Notes (Signed)
Progress Note   Patient: Anthony Adams WNU:[id number] DOB: 01/25/1951 DOA: 02/18/2023     2 DOS: the patient was seen and examined on 02/20/2023   Brief hospital admission course: As per H&P written by Dr.Sreeram on 02/18/2023 Anthony Adams is a 72 y.o. male with medical history significant of type 2 diabetes mellitus, hypertension, CAD status post CABG, chronic systolic heart failure, ischemic cardiomyopathy status post ICD (did not have battery replaced due to improved EF per cardiology note) presented to the emergency department for evaluation of a fall.  As per patient's sister patient fell early morning going to bathroom, has been confused since last several days.  Per sister, he has been having episodes of falling on his knees, back since last 1 month but did not seek any medical attention.  She also reports that he has been having shortness of breath, productive cough since last 2 days.  Patient denies any chest pain, shortness of breath or palpitations, dizziness during the episodes.  No fever, chills or rigors.     In the emergency department, BUN 32, creatinine 1.3, BNP 756, troponin 42, WBC 12.1, blood sugar 266, CT head showed no evidence of intracranial abnormality, cervical spine CT showed no evidence of fracture or subluxation.  A 2.7 cm right thyroid nodule noted recommended thyroid ultrasound.  Chest x-ray showed streaky left lung base opacity, atelectasis versus pneumonia, prior granulomatous disease.   During my exam he is able to answer me appropriately, moderately short of breath, has orthopnea (asked me to raise head end of bed), has been coughing.  Patient seems poor historian and states no complaints. ED provider requested hospitalist admission for further management evaluation of fall, CHF exacerbation, possible pneumonia.  Assessment and Plan: 1-near syncope/frequent falls --2D echo demonstrating ejection fraction of 55 to 60%; grade 1 diastolic dysfunction and no significant  valvular disorder.  Study was not optimal to evaluate for wall motion abnormalities. -US carotid Dopplers demonstrating minimal plaque buildup with no hemodynamically significant stenosis in the extracranial cerebrovascular circulation. -CT head unremarkable -Follow PT/OT evaluation -Home health physical therapy (PT/RN) recommended at discharge.  2-community-acquired pneumonia -Continue current IV antibiotics -Continue antitussives/mucolytic's and the use of flutter valve -As needed bronchodilators and oxygen supplementation will be provided. -Speech therapy has evaluated patient and the recommendation is for regular consistency with thin liquids.  3-acute on chronic systolic heart failure -Patient with underlying history of coronary artery disease status post CABG and also Ischemic cardiomyopathy status post ICD. -Continue daily weights, strict I's and O's and IV diuresis -Cardiology service has been consulted and will follow recommendation -after reviewing EP chart and discussing with cardiology service not exchange in his ICD battery will be attempted at the moment (last EF normalized). Will follow Echo and follow further rec's.  -Continue the use of beta-blocker, Cozaar and follow cardiology recommendations for any further GDMT adjustments. -Need to follow electrolytes and renal function. -For history of coronary artery disease we will continue the use of aspirin and statin. -Patient's condition appears to have stabilized at the moment and no further acute diuresis will be attempted. -Per cardiology recommendations use Lasix on as needed basis only.  4-hypokalemia -Continue to follow electrolytes and further replete as needed -Potassium 3.1 currently -Magnesium within normal limits.  5-type 2 diabetes mellitus -Follow update of A1c -Continue holding oral hypoglycemic agents while inpatient -Continue sliding scale insulin, follow CBG fluctuation and adjust hypoglycemic regimen as  needed.  6-hyperlipidemia -Continue statin. -Heart healthy diet discussed with patient.  7-history of BPH -Continue Flomax. -Retention.  8-essential hypertension -Continue current antihypertensive regimen and follow vital signs. -Heart healthy diet discussed with patient.  9-chronic kidney disease stage IIIa -Overall stable and compensated -Continue to follow renal function trend while diuresing -Patient advised to maintain adequate oral hydration. -Continue to minimize nephrotoxic agents and the use of contrast.  10-diarrhea -Most likely associated with antibiotic usage -Flora still has been added -Patient instructed to maintain adequate hydration and increase fiber diet.  Subjective:  Reported intermittent coughing spells; denies orthopnea and overall improvement in his respiratory status.  No requiring oxygen supplementation.  Physical Exam: Vitals:   02/20/23 0407 02/20/23 0638 02/20/23 0736 02/20/23 1319  BP: (!) 167/90  (!) 157/76 (!) 158/74  Pulse: 68  74 67  Resp:      Temp: 99 F (37.2 C)  97.9 F (36.6 C) 99.4 F (37.4 C)  TempSrc: Oral   Oral  SpO2: 95%  93% 96%  Weight:  95.2 kg    Height:  6\' 1"  (1.854 m)     General exam: Alert, awake, following commands appropriately and expressing improvement in his breathing.  Feeling weak and tired from acute illness, the presence of diarrhea and lack of sleep. Respiratory system: No crackles, no using accessory muscles; positive rhonchi appreciated bilaterally. Cardiovascular system: Rate controlled, no rubs, no gallops, no JVD on exam. Gastrointestinal system: Abdomen is nondistended, soft and nontender. No organomegaly or masses felt. Normal bowel sounds heard. Central nervous system: Alert and oriented. No focal neurological deficits. Extremities: No clubbing; no edema.  No cyanosis. Skin: No petechiae. Psychiatry: Mood and affect appropriate.  Data Reviewed: A1c: 6.4 Latest CBC: WBCs 10.6, hemoglobin 13.5  and platelet count 153K Basic metabolic panel: Sodium 137, potassium 3.1, chloride 99, bicarb 25, glucose 178, BUN 41, creatinine 1.29 and GFR >60 Magnesium: 2.1  Family Communication: Wife at bedside.  Disposition: Status is: Inpatient Remains inpatient appropriate because: Continue IV diuresis and IV antibiotics.   Planned Discharge Destination: Home; patient will most likely benefit of home health PT at discharge.  Time spent: 55 minutes  Author: Vassie Loll, MD 02/20/2023 6:36 PM  For on call review [email or web address].

## 2023-02-20 NOTE — TOC Progression Note (Signed)
Transition of Care [facility]) - Progression Note    Patient Details  Name: Anthony Adams MRN: [id number] Date of Birth: 1951/01/14  [facility] CM/SW Contact  Villa Herb, [place] Phone Number: 02/20/2023, 10:49 AM  Clinical Narrative:    Victorino Dike with [facility] accepted [facility] PT/RN referral. Will need HH orders placed by MD prior to D/C. TOC to follow.   Expected Discharge Plan: Home w Home Health Services Barriers to Discharge: Continued Medical Work up  Expected Discharge Plan and Services In-house Referral: Clinical Social Work Discharge Planning Services: CM Consult   Living arrangements for the past 2 months: Single Family Home                 DME Arranged: Walker rolling DME Agency: [employer] Date DME Agency Contacted: 02/19/23   Representative spoke with at DME Agency: Ian Malkin             Social Determinants of Health (SDOH) Interventions SDOH Screenings   Food Insecurity: No Food Insecurity (02/19/2023)  Housing: Low Risk  (02/19/2023)  Transportation Needs: No Transportation Needs (02/19/2023)  Utilities: Not At Risk (02/19/2023)  Tobacco Use: High Risk (02/18/2023)    Readmission Risk Interventions     No data to display

## 2023-02-21 DIAGNOSIS — N183 Chronic kidney disease, stage 3 unspecified: Secondary | ICD-10-CM | POA: Diagnosis present

## 2023-02-21 DIAGNOSIS — R42 Dizziness and giddiness: Secondary | ICD-10-CM

## 2023-02-21 DIAGNOSIS — I509 Heart failure, unspecified: Secondary | ICD-10-CM | POA: Diagnosis not present

## 2023-02-21 LAB — BASIC METABOLIC PANEL
Anion gap: 9 (ref 5–15)
BUN: 30 mg/dL — ABNORMAL HIGH (ref 8–23)
CO2: 25 mmol/L (ref 22–32)
Calcium: 8.6 mg/dL — ABNORMAL LOW (ref 8.9–10.3)
Chloride: 102 mmol/L (ref 98–111)
Creatinine, Ser: 1.11 mg/dL (ref 0.61–1.24)
GFR, Estimated: >60 mL/min (ref >60)
Glucose, Bld: 140 mg/dL — ABNORMAL HIGH (ref 70–99)
Potassium: 3.7 mmol/L (ref 3.5–5.1)
Sodium: 136 mmol/L (ref 135–145)

## 2023-02-21 LAB — GLUCOSE, CAPILLARY
Glucose-Capillary: 132 mg/dL — ABNORMAL HIGH (ref 70–99)
Glucose-Capillary: 216 mg/dL — ABNORMAL HIGH (ref 70–99)
Glucose-Capillary: 149 mg/dL — ABNORMAL HIGH (ref 70–99)
Glucose-Capillary: 194 mg/dL — ABNORMAL HIGH (ref 70–99)

## 2023-02-21 MED ORDER — SODIUM CHLORIDE 0.9 % IV SOLN: INTRAVENOUS | Status: AC

## 2023-02-21 NOTE — Progress Notes (Signed)
Progress Note   Patient: Anthony Adams GMW:[id number] DOB: 03-06-1951 DOA: 02/18/2023     3 DOS: the patient was seen and examined on 02/21/2023   Brief hospital admission course: As per H&P written by Dr.Sreeram on 02/18/2023 Anthony Adams is a 72 y.o. male with medical history significant of type 2 diabetes mellitus, hypertension, CAD status post CABG, chronic systolic heart failure, ischemic cardiomyopathy status post ICD (did not have battery replaced due to improved EF per cardiology note) presented to the emergency department for evaluation of a fall.  As per patient's sister patient fell early morning going to bathroom, has been confused since last several days.  Per sister, he has been having episodes of falling on his knees, back since last 1 month but did not seek any medical attention.  She also reports that he has been having shortness of breath, productive cough since last 2 days.  Patient denies any chest pain, shortness of breath or palpitations, dizziness during the episodes.  No fever, chills or rigors.     In the emergency department, BUN 32, creatinine 1.3, BNP 756, troponin 42, WBC 12.1, blood sugar 266, CT head showed no evidence of intracranial abnormality, cervical spine CT showed no evidence of fracture or subluxation.  A 2.7 cm right thyroid nodule noted recommended thyroid ultrasound.  Chest x-ray showed streaky left lung base opacity, atelectasis versus pneumonia, prior granulomatous disease.   During my exam he is able to answer me appropriately, moderately short of breath, has orthopnea (asked me to raise head end of bed), has been coughing.  Patient seems poor historian and states no complaints. ED provider requested hospitalist admission for further management evaluation of fall, CHF exacerbation, possible pneumonia.  Assessment and Plan: 1-near syncope/frequent falls --2D echo demonstrating ejection fraction of 55 to 60%; grade 1 diastolic dysfunction and no significant  valvular disorder.  Study was not optimal to evaluate for wall motion abnormalities. -US carotid Dopplers demonstrating minimal plaque buildup with no hemodynamically significant stenosis in the extracranial cerebrovascular circulation. -CT head unremarkable -Physical therapy recommend home health PT 02/21/23 -becomes very orthostatic with positional change --Patient had significant diarrhea/GI losses prior to admission ---patient will need additional IV fluids -Check a.m. cortisol levels  2-community-acquired pneumonia -Rocephin and azithromycin -Continue antitussives/mucolytic's and the use of flutter valve -Continue bronchodilators -Speech therapy has evaluated patient and the recommendation is for regular consistency with thin liquids.  3-chronic diastolic dysfunction CHF acute/prior history of low EF with AICD in situ -History of ischemic cardiomyopathy  -echo from 02/19/2023 with EF of 55 to 60% and grade 1 diastolic dysfunction -No aortic stenosis, no mitral stenosis -Patient with underlying history of coronary artery disease status post CABG and also Ischemic cardiomyopathy status post ICD. -Continue daily weights, strict I's and O's and IV diuresis -Cardiology service has been consulted and will follow recommendation -after reviewing EP chart and discussing with cardiology service not exchange in his ICD battery will be attempted at the moment (last EF normalized).   -Continue the use of beta-blocker, Cozaar and follow cardiology recommendations for any further GDMT adjustments. - -For history of coronary artery disease we will continue the use of aspirin and statin. -Patient's condition appears to have stabilized at the moment and no further acute diuresis will be attempted. -Per cardiology recommendations use Lasix on as needed basis only. -Monitor closely with IV fluids for dehydration purposes  4-hypokalemia --Due to GI losses, replaced and normalized  5-type 2 diabetes  mellitus-A1c 6.4 reflecting good  diabetic control PTA Use Novolog/Humalog Sliding scale insulin with Accu-Cheks/Fingersticks as ordered  -Continue holding oral hypoglycemic agents while inpatient   6-hyperlipidemia -Continue statin. -Heart healthy diet discussed with patient.  7-history of BPH -Continue Flomax. -Retention.  8-essential hypertension -Continue current antihypertensive regimen and follow vital signs. -Heart healthy diet discussed with patient.  9-chronic kidney disease stage IIIa -Continue improving with hydration  10-diarrhea -Most likely associated with antibiotic usage -Probiotic -No further diarrhea at this time  Subjective:  -Sister at bedside, questions answered -Patient with fatigue malaise and becomes very orthostatic with positional change  Physical Exam: Vitals:   02/21/23 0906 02/21/23 0910 02/21/23 0912 02/21/23 1334  BP: (!) 164/69 (!) 147/98 123/85 111/73  Pulse: 63 83 (!) 103 78  Resp:    18  Temp:    98.2 F (36.8 C)  TempSrc:    Oral  SpO2:    99%  Weight:      Height:       Physical Exam  Gen:- Awake Alert, in no acute distress  HEENT:- [place].AT, No sclera icterus Neck-Supple Neck,No JVD,.  Lungs-no wheezing, fair air movement bilaterally  CV- S1, S2 normal, RRR, right-sided AICD in situ, prior CABG scar Abd-  +ve B.Sounds, Abd Soft, No tenderness,    Extremity/Skin:- No  edema,   good pedal pulses  Psych-affect is appropriate, oriented x3 Neuro-no new focal deficits, no tremors   Family Communication: sister at bedside.  Disposition: Home with home health Status is: Inpatient  Author: Shon Hale, MD 02/21/2023 6:03 PM  For on call review [email or web address].

## 2023-02-21 NOTE — Care Management Important Message (Signed)
Important Message  Patient Details  Name: Anthony Adams MRN: [id number] Date of Birth: [birth year]   Important Message Given:  Yes - Medicare IM     Corey Harold 02/21/2023, 10:31 AM

## 2023-02-21 NOTE — Progress Notes (Addendum)
Physical Therapy Treatment Patient Details Name: Anthony Adams MRN: [id number] DOB: Dec 06, 1950 Today's Date: 02/21/2023   History of Present Illness Anthony Adams is a 72 y.o. male with medical history significant of type 2 diabetes mellitus, hypertension, CAD status post CABG, chronic systolic heart failure, ischemic cardiomyopathy status post ICD (did not have battery replaced due to improved EF per cardiology note) presented to the emergency department for evaluation of a fall.  As per patient's sister patient fell early morning going to bathroom, has been confused since last several days.  Per sister, he has been having episodes of falling on his knees, back since last 1 month but did not seek any medical attention.  She also reports that he has been having shortness of breath, productive cough since last 2 days.  Patient denies any chest pain, shortness of breath or palpitations, dizziness during the episodes.  No fever, chills or rigors.    PT Comments  Pt tolerated today's treatment session, well, with progression in ambulation distance. Today's session addressed functional activity tolerance training and strengthening through functional transfers and ambulation distance. Pt noted with mild balance deficits noted with ambulation with mild staggering but self correcting with improvements compared to evaluation. Pt showing poor BLE muscle strength during transfer sit/stands, pt requiring pushing with elbows on knees into lumbar extension as standing. Pt would continue to benefit from skilled acute physical therapy services in order to progress toward POC goals, safety/independence with functional mobility and QOL.     If plan is discharge home, recommend the following: A little help with walking and/or transfers;A little help with bathing/dressing/bathroom;Help with stairs or ramp for entrance;Assistance with cooking/housework   Can travel by private Neurosurgeon walker (2 wheels)    Recommendations for Other Services       Precautions / Restrictions Restrictions Weight Bearing Restrictions: No     Mobility  Bed Mobility               General bed mobility comments: Received sitting in recliner Patient Response: Cooperative  Transfers Overall transfer level: Needs assistance Equipment used: None Transfers: Sit to/from Stand Sit to Stand: Supervision, Contact guard assist           General transfer comment: 7x in total sit/stands 2x for ambulation to/from hallway @ supervision level with BUE support on handrail; 5x for functional strengthening without UE support. CGA level without powerup from BUE.    Ambulation/Gait Ambulation/Gait assistance: Supervision, Contact guard assist Gait Distance (Feet): 150 Feet Assistive device: None Gait Pattern/deviations: Decreased step length - right, Decreased step length - left, Decreased stride length, Staggering left, Staggering right       General Gait Details: reduced staggering this session, no need for UE support on walls this sesssion. 65ft x 2 with CGA progressing to supervision for balance and guidance with IV pole. 5x sit/stand training between two bouts of ambulation.   Stairs             Wheelchair Mobility     Tilt Bed Tilt Bed Patient Response: Cooperative  Modified Rankin (Stroke Patients Only)       Balance Overall balance assessment: Needs assistance Sitting-balance support: Feet supported, No upper extremity supported Sitting balance-Leahy Scale: Good Sitting balance - Comments: seated at recliner   Standing balance support: During functional activity, No upper extremity supported Standing balance-Leahy Scale: Good Standing balance comment: without AD  Cognition Arousal: Alert Behavior During Therapy: WFL for tasks assessed/performed Overall Cognitive Status: Within Functional Limits for tasks  assessed                                          Exercises General Exercises - Lower Extremity Long Arc Quad: AROM, Both, 10 reps Other Exercises Other Exercises: 5x Sit/stands without UE from recliner-heavy trunk flexion with primary movement coming from lumbar extension into standing. Other Exercises: Standing hip abduction at sink x 10 bilaterally    General Comments        Pertinent Vitals/Pain Pain Assessment Pain Assessment: No/denies pain    Home Living                          Prior Function            PT Goals (current goals can now be found in the care plan section) Acute Rehab PT Goals Patient Stated Goal: return home with family to assist PT Goal Formulation: With patient/family Time For Goal Achievement: 02/23/23 Potential to Achieve Goals: Good Progress towards PT goals: Progressing toward goals    Frequency    Min 3X/week      PT Plan      Co-evaluation              AM-PAC PT "6 Clicks" Mobility   Outcome Measure  Help needed turning from your back to your side while in a flat bed without using bedrails?: None Help needed moving from lying on your back to sitting on the side of a flat bed without using bedrails?: None Help needed moving to and from a bed to a chair (including a wheelchair)?: None Help needed standing up from a chair using your arms (e.g., wheelchair or bedside chair)?: A Little Help needed to walk in hospital room?: A Little Help needed climbing 3-5 steps with a railing? : A Lot 6 Click Score: 20    End of Session Equipment Utilized During Treatment: Gait belt Activity Tolerance: Patient tolerated treatment well Patient left: in chair;with call bell/phone within reach Nurse Communication: Mobility status PT Visit Diagnosis: Unsteadiness on feet (R26.81);Other abnormalities of gait and mobility (R26.89);Muscle weakness (generalized) (M62.81)     Time: 4782-9562 PT Time Calculation (min)  (ACUTE ONLY): 15 min  Charges:    $Therapeutic Activity: 8-22 mins PT General Charges $$ ACUTE PT VISIT: 1 Visit                     Nelida Meuse PT, DPT Physical Therapist with [facility] [phone] office   Nelida Meuse 02/21/2023, 3:28 PM

## 2023-02-22 DIAGNOSIS — I10 Essential (primary) hypertension: Secondary | ICD-10-CM

## 2023-02-22 DIAGNOSIS — I5043 Acute on chronic combined systolic (congestive) and diastolic (congestive) heart failure: Secondary | ICD-10-CM

## 2023-02-22 DIAGNOSIS — Z95810 Presence of automatic (implantable) cardiac defibrillator: Secondary | ICD-10-CM

## 2023-02-22 DIAGNOSIS — R42 Dizziness and giddiness: Secondary | ICD-10-CM | POA: Diagnosis not present

## 2023-02-22 LAB — GLUCOSE, CAPILLARY: Glucose-Capillary: 123 mg/dL — ABNORMAL HIGH (ref 70–99)

## 2023-02-22 LAB — RENAL FUNCTION PANEL
Albumin: 3 g/dL — ABNORMAL LOW (ref 3.5–5.0)
Anion gap: 10 (ref 5–15)
BUN: 20 mg/dL (ref 8–23)
CO2: 25 mmol/L (ref 22–32)
Calcium: 8.6 mg/dL — ABNORMAL LOW (ref 8.9–10.3)
Chloride: 103 mmol/L (ref 98–111)
Creatinine, Ser: 1.07 mg/dL (ref 0.61–1.24)
GFR, Estimated: >60 mL/min (ref >60)
Glucose, Bld: 149 mg/dL — ABNORMAL HIGH (ref 70–99)
Phosphorus: 2.5 mg/dL (ref 2.5–4.6)
Potassium: 3.4 mmol/L — ABNORMAL LOW (ref 3.5–5.1)
Sodium: 138 mmol/L (ref 135–145)

## 2023-02-22 LAB — CORTISOL-AM, BLOOD: Cortisol - AM: 10.4 ug/dL (ref 6.7–22.6)

## 2023-02-22 LAB — MAGNESIUM: Magnesium: 1.7 mg/dL (ref 1.7–2.4)

## 2023-02-22 MED ORDER — PRAVASTATIN SODIUM 40 MG PO TABS: 40.0000 mg | ORAL_TABLET | Freq: Every day | ORAL | 3 refills | Status: DC

## 2023-02-22 MED ORDER — ASPIRIN 81 MG PO TBEC: 81.0000 mg | DELAYED_RELEASE_TABLET | Freq: Every day | ORAL | 2 refills | Status: AC

## 2023-02-22 MED ORDER — CARVEDILOL 12.5 MG PO TABS: 18.7500 mg | ORAL_TABLET | Freq: Two times a day (BID) | ORAL | 3 refills | Status: DC

## 2023-02-22 MED ORDER — CEPHALEXIN 500 MG PO CAPS: 500.0000 mg | ORAL_CAPSULE | Freq: Three times a day (TID) | ORAL | 0 refills | Status: AC

## 2023-02-22 MED ORDER — LOSARTAN POTASSIUM 50 MG PO TABS: 50.0000 mg | ORAL_TABLET | Freq: Every day | ORAL | 3 refills | Status: AC

## 2023-02-22 MED ORDER — POTASSIUM CHLORIDE CRYS ER 20 MEQ PO TBCR
40.0000 meq | EXTENDED_RELEASE_TABLET | Freq: Once | ORAL | Status: AC
  Administered 2023-02-22: 40 meq via ORAL
  Filled 2023-02-22: qty 2

## 2023-02-22 MED ORDER — DOXYCYCLINE HYCLATE 100 MG PO TABS: 100.0000 mg | ORAL_TABLET | Freq: Two times a day (BID) | ORAL | 0 refills | Status: AC

## 2023-02-22 NOTE — Progress Notes (Signed)
Dr. Thomes Dinning notified of 9 beat run of Vtach. Pt denies pain or discomfort at this time.

## 2023-02-22 NOTE — Discharge Summary (Signed)
Anthony Adams, is a 72 y.o. male  DOB 08-05-1950  MRN [id number].  Admission date:  02/18/2023  Admitting Physician  Marcelino Duster, MD  Discharge Date:  02/22/2023   Primary MD  Ignatius Specking, MD  Recommendations for primary care physician for things to follow:   1)Avoid ibuprofen/Advil/Aleve/Motrin/Goody Powders/Naproxen/BC powders/Meloxicam/Diclofenac/Indomethacin and other Nonsteroidal anti-inflammatory medications as these will make you more likely to bleed and can cause stomach ulcers, can also cause Kidney problems.   2)Very low-salt diet advised--less than 2 g of sodium per day advised  3)Weigh yourself daily, call if you gain more than 3 pounds in 1 day or more than 5 pounds in 1 week as your diuretic medications may need to be adjusted   Admission Diagnosis  CHF exacerbation (HCC) [I50.9] Acute on chronic congestive heart failure, unspecified heart failure type (HCC) [I50.9]   Discharge Diagnosis  CHF exacerbation (HCC) [I50.9] Acute on chronic congestive heart failure, unspecified heart failure type (HCC) [I50.9]    Principal Problem:   CHF exacerbation (HCC) Active Problems:   CKD 3A   Orthostatic dizziness   Essential hypertension   CAD (coronary artery disease)   ICD (implantable cardioverter-defibrillator) in place   S/P CABG x 5      Past Medical History:  Diagnosis Date   AICD (automatic cardioverter/defibrillator) present 08/02/2016   a. 07/2016 biotronik-->req removal in 09/2016 and replacement in 11/2016 due to stitch abscess.  No plan to replace due to improved EF.   Arthritis    " In my hands , back, feet " (12/20/2016)   Blood glucose elevated    March, 2012   CAD (coronary artery disease)    a. 2007 PCI/DES x 2 LAD; b. 05/2010 Cath: LM 10d, LAD 68m, 30m ISR, 80-90d, D2 20ost, D3 80, small, LCX nondom, nl, RI 69m, RCA large, 20p, 69m, RPDA 40p, RPL1 90ost, RPL2 80-->Med  rx; c. 05/2015 CABG x 5 ([facility]).   CKD (chronic kidney disease), stage III (HCC)    Dyslipidemia    Gout    April, 201 to   Heart failure with improved ejection fraction (HFimpEF) (HCC)    a. 01/2016 Echo: EF 20%; b. 06/2016 Echo: EF 30%; c. 08/2021 Echo: EF 50-55%. mild LVH. GrI DD. nl RV fxn. Sev dil LA. Triv MR.   Hypertension    Ischemic cardiomyopathy    a. 01/2016 Echo: EF 20%; b. 06/2016 Echo: EF 30%; c. 07/2016 s/p Biotronik AICD->removed 09/2016 due to stitch abscess->replaced 11/2016; d. 08/2021 Echo: EF 50-55%.   SOB (shortness of breath)    Type II diabetes mellitus (HCC)     Past Surgical History:  Procedure Laterality Date   BACK SURGERY     CARDIAC CATHETERIZATION  08/2005; 05/2010   Hattie Perch 08/09/2010; notes 05/29/2010   CARDIAC DEFIBRILLATOR PLACEMENT  12/20/2016   CORONARY ANGIOPLASTY WITH STENT PLACEMENT  08/2005   Hattie Perch 08/09/2010   CORONARY ARTERY BYPASS GRAFT  05/2015   CABG X5   ICD GENERATOR REMOVAL N/A 10/12/2016   Procedure: ICD Generator  Removal;  Surgeon: Marinus Maw, MD;  Location[facility] INVASIVE CV LAB;  Service: Cardiovascular;  Laterality: N/A;   ICD IMPLANT N/A 08/02/2016   Procedure: ICD Implant;  Surgeon: Marinus Maw, MD;  Location: [facility] INVASIVE CV LAB;  Service: Cardiovascular;  Laterality: N/A;   ICD IMPLANT N/A 12/20/2016   Procedure: ICD Implant;  Surgeon: Marinus Maw, MD;  Location: [facility] INVASIVE CV LAB;  Service: Cardiovascular;  Laterality: N/A;   ICD LEAD REMOVAL  10/12/2016   Procedure: Icd Lead Removal;  Surgeon: Marinus Maw, MD;  Location: [facility] INVASIVE CV LAB;  Service: Cardiovascular;;   LUMBAR DISC SURGERY  X 3       HPI  from the history and physical done on the day of admission:   HPI: Anthony Adams is a 72 y.o. male with medical history significant of type 2 diabetes mellitus, hypertension, CAD status post CABG, chronic systolic heart failure, ischemic cardiomyopathy status post ICD (did not have battery replaced due to improved EF per  cardiology note) presented to the emergency department for evaluation of a fall.  As per patient's sister patient fell early morning going to bathroom, has been confused since last several days.  Per sister, he has been having episodes of falling on his knees, back since last 1 month but did not seek any medical attention.  She also reports that he has been having shortness of breath, productive cough since last 2 days.  Patient denies any chest pain, shortness of breath or palpitations, dizziness during the episodes.  No fever, chills or rigors.     In the emergency department, BUN 32, creatinine 1.3, BNP 756, troponin 42, WBC 12.1, blood sugar 266, CT head showed no evidence of intracranial abnormality, cervical spine CT showed no evidence of fracture or subluxation.  A 2.7 cm right thyroid nodule noted recommended thyroid ultrasound.  Chest x-ray showed streaky left lung base opacity, atelectasis versus pneumonia, prior granulomatous disease.   During my exam he is able to answer me appropriately, moderately short of breath, has orthopnea (asked me to raise head end of bed), has been coughing.  Patient seems poor historian and states no complaints. ED provider requested hospitalist admission for further management evaluation of fall, CHF exacerbation, possible pneumonia.   Review of Systems: unable to review all systems due to the inability of the patient to answer questions.     Hospital Course:     1-near syncope/frequent falls --2D echo demonstrating ejection fraction of 55 to 60%; grade 1 diastolic dysfunction and no significant valvular disorder.  Study was not optimal to evaluate for wall motion abnormalities. -US carotid Dopplers demonstrating minimal plaque buildup with no hemodynamically significant stenosis in the extracranial cerebrovascular circulation. -CT head unremarkable -Physical therapy recommend home health PT -Orthostatic vitals improving--despite drop in systolic blood  pressure and increasing heart rate with positional change patient with asymptomatic --Patient had significant diarrhea/GI losses prior to admission --  -A.m. cortisol is not low currently 10.4   2-community-acquired pneumonia -Treated with Rocephin and azithromycin -Continue antitussives/mucolytic's and the use of flutter valve -and  bronchodilators -Speech therapy has evaluated patient and the recommendation is for regular consistency with thin liquids. -Clinically much improved, okay to discharge on doxycycline and Keflex   3-chronic diastolic dysfunction CHF acute/prior history of low EF with AICD in situ -History of ischemic cardiomyopathy  -echo from 02/19/2023 with EF of 55 to 60% and grade 1 diastolic dysfunction -No aortic stenosis, no mitral stenosis -Patient  with underlying history of coronary artery disease status post CABG and also Ischemic cardiomyopathy status post ICD. Cardiology service has been consulted and will follow recommendation -after reviewing EP chart and discussing with cardiology service not exchange in his ICD battery will be attempted at the moment (last EF normalized).   -Continue Coreg and losartan - -For history of coronary artery disease we will continue the use of aspirin and pravastatin. -Per cardiology recommendations use Lasix on as needed basis only.   4-hypokalemia --Due to GI losses, replaced and normalized   5-type 2 diabetes mellitus-A1c 6.4 reflecting good diabetic control PTA -Okay to restart glipizide and metformin     6-hyperlipidemia -Continue pravastatin. -Heart healthy diet discussed with patient.   7-history of BPH -Continue Flomax. -Flomax may be contributing to orthostatic hypotension  8-essential hypertension -Coreg and losartan as above -Concerns about orthostatic drops however patient is currently dehydrated follow-up with PCP for reevaluation   9-AKI--Patient had significant diarrhea/GI losses prior to admission   -Function normalized with hydration -Upon further evaluation patient does Not have CKD patient had AKI due to dehydration   10-diarrhea -Most likely associated with antibiotic usage -Probiotic -No further diarrhea at this time -Maintain adequate hydration -Eating and drinking okay   Discharge Condition: stable  Follow UP   Follow-up Information     Sharlene Dory, NP Follow up on 03/26/2023.   Specialty: Cardiology Why: Cardiology Hospital Follow-up on 03/26/2023 at 1:30 PM. Will be at the [facility] office. Contact information: [address] [place] [address] [phone]         [facility] Follow up.   Specialty: Home Health Services Why: Home health will call to schedule your home visit. Contact information: [address] [place] [address] [phone]         Ignatius Specking, MD. Schedule an appointment as soon as possible for a visit in 5 day(s).   Specialty: Internal Medicine Why: BP Recheck and am Cortisol Level results Contact information: [address] [place] [address] 336 [phone]                  Consults obtained -cardiology  Diet and Activity recommendation:  As advised  Discharge Instructions    Discharge Instructions     Call MD for:  difficulty breathing, headache or visual disturbances   Complete by: As directed    Call MD for:  persistant dizziness or light-headedness   Complete by: As directed    Call MD for:  persistant nausea and vomiting   Complete by: As directed    Call MD for:  temperature >100.4   Complete by: As directed    Diet - low sodium heart healthy   Complete by: As directed    Discharge instructions   Complete by: As directed    1)Avoid ibuprofen/Advil/Aleve/Motrin/Goody Powders/Naproxen/BC powders/Meloxicam/Diclofenac/Indomethacin and other Nonsteroidal anti-inflammatory medications as these will make you more likely to bleed and can cause stomach ulcers, can also cause Kidney  problems.   2)Very low-salt diet advised--less than 2 g of sodium per day advised  3)Weigh yourself daily, call if you gain more than 3 pounds in 1 day or more than 5 pounds in 1 week as your diuretic medications may need to be adjusted  4) follow-up to primary care physician to get results of your cortisol blood test that was drawn today , results not available yet at this time   Increase activity slowly   Complete by:  As directed          Discharge Medications     Allergies as of 02/22/2023   No Known Allergies      Medication List     STOP taking these medications    metoprolol succinate 25 MG 24 hr tablet Commonly known as: TOPROL-XL       TAKE these medications    acetaminophen 500 MG tablet Commonly known as: TYLENOL Take 1,000 mg by mouth 2 (two) times daily as needed for headache.   aspirin EC 81 MG tablet Take 1 tablet (81 mg total) by mouth daily with breakfast. What changed: when to take this   carvedilol 12.5 MG tablet Commonly known as: COREG Take 1.5 tablets (18.75 mg total) by mouth 2 (two) times daily.   cephALEXin 500 MG capsule Commonly known as: Keflex Take 1 capsule (500 mg total) by mouth 3 (three) times daily for 5 days.   diclofenac Sodium 1 % Gel Commonly known as: VOLTAREN Apply 4 g topically daily as needed for pain.   doxycycline 100 MG tablet Commonly known as: VIBRA-TABS Take 1 tablet (100 mg total) by mouth 2 (two) times daily for 5 days.   dutasteride 0.5 MG capsule Commonly known as: AVODART Take 0.5 mg by mouth daily.   furosemide 20 MG tablet Commonly known as: LASIX Take 20 mg by mouth daily as needed for fluid.   gabapentin 600 MG tablet Commonly known as: NEURONTIN Take 600 mg by mouth 4 (four) times daily.   glipiZIDE 2.5 MG 24 hr tablet Commonly known as: GLUCOTROL XL Take 2.5 mg by mouth daily.   HYDROcodone-acetaminophen 10-325 MG tablet Commonly known as: NORCO Take 1 tablet by mouth in the morning,  at noon, in the evening, and at bedtime.   losartan 50 MG tablet Commonly known as: COZAAR Take 1 tablet (50 mg total) by mouth daily. Start taking on: February 23, 2023 What changed:  medication strength how much to take   metFORMIN 500 MG 24 hr tablet Commonly known as: GLUCOPHAGE-XR Take 500 mg by mouth 2 (two) times daily.   Narcan 4 MG/0.1ML Liqd nasal spray kit Generic drug: naloxone Place 1 spray into the nose daily as needed (Overdose reverse).   pantoprazole 40 MG tablet Commonly known as: PROTONIX Take 40 mg by mouth daily.   potassium chloride 10 MEQ tablet Commonly known as: KLOR-CON Take 10 mEq by mouth daily.   pravastatin 40 MG tablet Commonly known as: PRAVACHOL Take 1 tablet (40 mg total) by mouth daily.   tamsulosin 0.4 MG Caps capsule Commonly known as: FLOMAX Take 0.4 mg by mouth daily after breakfast.        Major procedures and Radiology Reports - PLEASE review detailed and final reports for all details, in brief -   ECHOCARDIOGRAM COMPLETE  Result Date: 02/19/2023    ECHOCARDIOGRAM REPORT   Patient Name:   Anthony Adams Date of Exam: 02/19/2023 Medical Rec #:  [id number]    Height:       73.0 in Accession #:    [id number]   Weight:       206.6 lb Date of Birth:  [birth year]   BSA:          2.181 m Patient Age:    71 years     BP:           143/127 mmHg Patient Gender: M            HR:  73 bpm. [facility] Procedure: 2D Echo, Cardiac Doppler, Color Doppler and Intracardiac            Opacification Agent Indications:    Congestive Heart Failure I50.9  History:        Patient has prior history of Echocardiogram examinations, most                 recent 09/02/2021. CHF, CAD; Risk Factors:Hypertension and                 Diabetes.  Sonographer:    Darlys Gales Referring Phys: [id number] Marcelino Duster IMPRESSIONS  1. Left ventricular ejection fraction, by estimation, is 55 to 60%. The left ventricle has normal function. Left  ventricular endocardial border not optimally defined to evaluate regional wall motion. Left ventricular diastolic parameters are consistent with Grade I diastolic dysfunction (impaired relaxation).  2. Right ventricular systolic function is normal. The right ventricular size is normal. Tricuspid regurgitation signal is inadequate for assessing PA pressure.  3. Left atrial size was moderately dilated.  4. The mitral valve is normal in structure. No evidence of mitral valve regurgitation. No evidence of mitral stenosis.  5. The aortic valve is tricuspid. Aortic valve regurgitation is not visualized. No aortic stenosis is present. FINDINGS  Left Ventricle: Left ventricular ejection fraction, by estimation, is 55 to 60%. The left ventricle has normal function. Left ventricular endocardial border not optimally defined to evaluate regional wall motion. Definity contrast agent was given IV to delineate the left ventricular endocardial borders. The left ventricular internal cavity size was normal in size. There is no left ventricular hypertrophy. Left ventricular diastolic parameters are consistent with Grade I diastolic dysfunction (impaired relaxation). Normal left ventricular filling pressure. Right Ventricle: The right ventricular size is normal. Right vetricular wall thickness was not well visualized. Right ventricular systolic function is normal. Tricuspid regurgitation signal is inadequate for assessing PA pressure. Left Atrium: Left atrial size was moderately dilated. Right Atrium: Right atrial size was normal in size. Pericardium: There is no evidence of pericardial effusion. Mitral Valve: The mitral valve is normal in structure. No evidence of mitral valve regurgitation. No evidence of mitral valve stenosis. Tricuspid Valve: The tricuspid valve is normal in structure. Tricuspid valve regurgitation is not demonstrated. No evidence of tricuspid stenosis. Aortic Valve: The aortic valve is tricuspid. Aortic valve  regurgitation is not visualized. No aortic stenosis is present. Aortic valve mean gradient measures 3.0 mmHg. Aortic valve peak gradient measures 4.7 mmHg. Aortic valve area, by VTI measures 2.67 cm. Pulmonic Valve: The pulmonic valve was not well visualized. Pulmonic valve regurgitation is not visualized. No evidence of pulmonic stenosis. Aorta: The aortic root was not well visualized. IAS/Shunts: The interatrial septum was not well visualized. Additional Comments: A device lead is visualized in the right atrium and right ventricle.  LEFT VENTRICLE PLAX 2D LVIDd:         5.10 cm   Diastology LVIDs:         4.00 cm   LV e' medial:    2.83 cm/s LV PW:         1.00 cm   LV E/e' medial:  15.6 LV IVS:        1.00 cm   LV e' lateral:   5.98 cm/s LVOT diam:     2.00 cm   LV E/e' lateral: 7.4 LV SV:         55 LV SV Index:   25 LVOT Area:  3.14 cm  RIGHT VENTRICLE RV S prime:     9.46 cm/s LEFT ATRIUM              Index        RIGHT ATRIUM           Index LA Vol (A2C):   97.3 ml  44.60 ml/m  RA Area:     19.40 cm LA Vol (A4C):   94.8 ml  43.46 ml/m  RA Volume:   46.80 ml  21.45 ml/m LA Biplane Vol: 100.0 ml 45.84 ml/m  AORTIC VALVE AV Area (Vmax):    3.20 cm AV Area (Vmean):   2.70 cm AV Area (VTI):     2.67 cm AV Vmax:           108.00 cm/s AV Vmean:          80.100 cm/s AV VTI:            0.207 m AV Peak Grad:      4.7 mmHg AV Mean Grad:      3.0 mmHg LVOT Vmax:         110.00 cm/s LVOT Vmean:        68.900 cm/s LVOT VTI:          0.176 m LVOT/AV VTI ratio: 0.85 MITRAL VALVE MV Area (PHT): 2.01 cm    SHUNTS MV Decel Time: 377 msec    Systemic VTI:  0.18 m MV E velocity: 44.10 cm/s  Systemic Diam: 2.00 cm MV A velocity: 87.00 cm/s MV E/A ratio:  0.51 Dina Rich MD Electronically signed by Dina Rich MD Signature Date/Time: 02/19/2023/5:06:50 PM    Final    US Carotid Bilateral  Result Date: 02/19/2023 CLINICAL DATA:  72 year old male with a history of syncope EXAM: BILATERAL CAROTID DUPLEX  ULTRASOUND TECHNIQUE: Wallace Cullens scale imaging, color Doppler and duplex ultrasound were performed of bilateral carotid and vertebral arteries in the neck. COMPARISON:  None Available. FINDINGS: Criteria: Quantification of carotid stenosis is based on velocity parameters that correlate the residual internal carotid diameter with NASCET-based stenosis levels, using the diameter of the distal internal carotid lumen as the denominator for stenosis measurement. The following velocity measurements were obtained: RIGHT ICA:  Systolic 112 cm/sec, Diastolic 23 cm/sec CCA:  73 cm/sec SYSTOLIC ICA/CCA RATIO:  1.7 ECA:  99 cm/sec LEFT ICA:  Systolic 83 cm/sec, Diastolic 18 cm/sec CCA:  97 cm/sec SYSTOLIC ICA/CCA RATIO:  0.9 ECA:  93 cm/sec Right Brachial SBP: Not acquired Left Brachial SBP: Not acquired RIGHT CAROTID ARTERY: No significant calcified disease of the right common carotid artery. Intermediate waveform maintained. Heterogeneous plaque without significant calcifications at the right carotid bifurcation. Low resistance waveform of the right ICA. No significant tortuosity. RIGHT VERTEBRAL ARTERY: Antegrade flow with low resistance waveform. LEFT CAROTID ARTERY: No significant calcified disease of the left common carotid artery. Intermediate waveform maintained. Heterogeneous plaque at the left carotid bifurcation without significant calcifications. Low resistance waveform of the left ICA. LEFT VERTEBRAL ARTERY:  Antegrade flow with low resistance waveform. IMPRESSION: Color duplex indicates minimal heterogeneous plaque, with no hemodynamically significant stenosis by duplex criteria in the extracranial cerebrovascular circulation. Signed, Yvone Neu. Miachel Roux, RPVI [facility] Electronically Signed   By: Gilmer Mor D.O.   On: 02/19/2023 14:08   CT Head Wo Contrast  Result Date: 02/18/2023 CLINICAL DATA:  Head trauma, minor (Age >= 65y); Neck trauma (Age  >= 65y) EXAM: CT HEAD WITHOUT CONTRAST CT  CERVICAL SPINE WITHOUT CONTRAST TECHNIQUE: Multidetector CT imaging of the head and cervical spine was performed following the standard protocol without intravenous contrast. Multiplanar CT image reconstructions of the cervical spine were also generated. RADIATION DOSE REDUCTION: This exam was performed according to the departmental dose-optimization program which includes automated exposure control, adjustment of the mA and/or kV according to patient size and/or use of iterative reconstruction technique. COMPARISON:  None Available. FINDINGS: CT HEAD FINDINGS Brain: No hemorrhage. No hydrocephalus. No extra-axial fluid collection. No CT evidence of an acute cortical infarct. No mass effect. No mass lesion. Vascular: No hyperdense vessel or unexpected calcification. Skull: Soft tissue swelling along the right posterior scalp. No evidence of underlying calvarial fracture. Sinuses/Orbits: No middle ear or mastoid effusion. Paranasal sinuses are clear. Orbits are unremarkable. Other: None. CT CERVICAL SPINE FINDINGS Alignment: Straightening of the normal cervical lordosis. Skull base and vertebrae: No acute fracture. No primary bone lesion or focal pathologic process. Soft tissues and spinal canal: No prevertebral fluid or swelling. No visible canal hematoma. Disc levels:  No CT evidence of high-grade spinal canal stenosis. Upper chest: Negative. Other: There is a 2.7 cm right thyroid nodule. Recommend further evaluation with dedicated thyroid ultrasound, if not previously performed. IMPRESSION: 1. No CT evidence of intracranial injury. 2. Soft tissue swelling along the right posterior scalp. No evidence of underlying calvarial fracture. 3. No acute fracture or traumatic subluxation of the cervical spine. 4. There is a 2.7 cm right thyroid nodule. Recommend further evaluation with dedicated thyroid ultrasound, if not previously performed. Electronically Signed   By: Lorenza Cambridge M.D.   On: 02/18/2023 18:29   CT Cervical Spine Wo Contrast  Result Date: 02/18/2023 CLINICAL DATA:  Head trauma, minor (Age >= 65y); Neck trauma (Age >= 65y) EXAM: CT HEAD WITHOUT CONTRAST CT CERVICAL SPINE WITHOUT CONTRAST TECHNIQUE: Multidetector CT imaging of the head and cervical spine was performed following the standard protocol without intravenous contrast. Multiplanar CT image reconstructions of the cervical spine were also generated. RADIATION DOSE REDUCTION: This exam was performed according to the departmental dose-optimization program which includes automated exposure control, adjustment of the mA and/or kV according to patient size and/or use of iterative reconstruction technique. COMPARISON:  None Available. FINDINGS: CT HEAD FINDINGS Brain: No hemorrhage. No hydrocephalus. No extra-axial fluid collection. No CT evidence of an acute cortical infarct. No mass effect. No mass lesion. Vascular: No hyperdense vessel or unexpected calcification. Skull: Soft tissue swelling along the right posterior scalp. No evidence of underlying calvarial fracture. Sinuses/Orbits: No middle ear or mastoid effusion. Paranasal sinuses are clear. Orbits are unremarkable. Other: None. CT CERVICAL SPINE FINDINGS Alignment: Straightening of the normal cervical lordosis. Skull base and vertebrae: No acute fracture. No primary bone lesion or focal pathologic process. Soft tissues and spinal canal: No prevertebral fluid or swelling. No visible canal hematoma. Disc levels:  No CT evidence of high-grade spinal canal stenosis. Upper chest: Negative. Other: There is a 2.7 cm right thyroid nodule. Recommend further evaluation with dedicated thyroid ultrasound, if not previously performed. IMPRESSION: 1. No CT evidence of intracranial injury. 2. Soft tissue swelling along the right posterior scalp. No evidence of underlying calvarial fracture. 3. No acute fracture or traumatic subluxation of the cervical spine. 4. There is a  2.7 cm right thyroid nodule. Recommend further evaluation with dedicated thyroid ultrasound, if not previously performed. Electronically Signed   By: Lorenza Cambridge M.D.   On: 02/18/2023 18:29   DG Chest 2 View  Result Date:  02/18/2023 CLINICAL DATA:  Shortness of breath EXAM: CHEST - 2 VIEW COMPARISON:  12/21/2016 FINDINGS: Right-sided single lead pacing device. Post sternotomy changes. No pleural effusion or pneumothorax. Streaky left lung base opacity. Numerous calcified lung nodules and calcified hilar nodes consistent with prior granulomatous disease. Subsegmental atelectasis at the bases. IMPRESSION: 1. Streaky left lung base opacity, atelectasis versus pneumonia. 2. Evidence of prior granulomatous disease. Electronically Signed   By: Jasmine Pang M.D.   On: 02/18/2023 17:35    Today   Subjective    Leata Mouse today has no new complaints -Patient's sister at bedside, questions answered -Despite drop in systolic blood pressure and increasing heart rate with positional change patient is asymptomatic          Patient has been seen and examined prior to discharge   Objective   Blood pressure (!) 185/79, pulse 68, temperature 98.7 F (37.1 C), temperature source Oral, resp. rate 16, height 6\' 1"  (1.854 m), weight 95.5 kg, SpO2 96%.   Intake/Output Summary (Last 24 hours) at 02/22/2023 1834 Last data filed at 02/22/2023 0903 Gross per 24 hour  Intake 988.3 ml  Output --  Net 988.3 ml    Exam Gen:- Awake Alert, no acute distress  HEENT:- [place].AT, No sclera icterus Neck-Supple Neck,No JVD,.  Lungs-  CTAB , good air movement bilaterally CV- S1, S2 normal, regular, right-sided AICD in situ, prior CABG scar  Abd-  +ve B.Sounds, Abd Soft, No tenderness,    Extremity/Skin:- No  edema,   good pulses Psych-affect is appropriate, oriented x3 Neuro-no new focal deficits, no tremors    Data Review   CBC w Diff:  Lab Results  Component Value Date   WBC 10.6 (H) 02/19/2023   HGB 13.5  02/19/2023   HGB 12.4 (L) 08/16/2021   HCT 40.4 02/19/2023   HCT 36.4 (L) 08/16/2021   PLT 153 02/19/2023   PLT 134 (L) 08/16/2021   LYMPHOPCT 9 02/19/2023   MONOPCT 4 02/19/2023   EOSPCT 0 02/19/2023   BASOPCT 0 02/19/2023    CMP:  Lab Results  Component Value Date   NA 138 02/22/2023   NA 142 08/16/2021   K 3.4 (L) 02/22/2023   CL 103 02/22/2023   CO2 25 02/22/2023   BUN 20 02/22/2023   BUN 17 08/16/2021   CREATININE 1.07 02/22/2023   PROT 7.8 02/18/2023   ALBUMIN 3.0 (L) 02/22/2023   BILITOT 1.9 (H) 02/18/2023   ALKPHOS 47 02/18/2023   AST 24 02/18/2023   ALT 15 02/18/2023  .  Total Discharge time is about 33 minutes  Shon Hale M.D on 02/22/2023 at 6:34 PM  Go to [email or web address] -  for contact info  [facility] - Office  [phone]

## 2023-02-22 NOTE — Progress Notes (Signed)
Pt is alert and oriented. No distress noted. Discharge instruction gone over with patient and family. Voiced understanding. IV removed. Tele removed and returned to desk.

## 2023-02-22 NOTE — Plan of Care (Signed)

## 2023-02-22 NOTE — TOC Transition Note (Signed)
Transition of Care [facility]) - CM/SW Discharge Note   Patient Details  Name: LENN SRINIVASAN MRN: [id number] Date of Birth: 28-May-1950  [facility]) CM/SW Contact:  Leitha Bleak, RN Phone Number: 02/22/2023, 9:16 AM   Clinical Narrative:   Patient discharging home with [facility]/RN. MD aware to order. Victorino Dike updated with discharge of today.     Final next level of care: Home w Home Health Services Barriers to Discharge: Barriers Resolved   Patient Goals and CMS Choice CMS Medicare.gov Compare Post Acute Care list provided to:: Patient Choice offered to / list presented to : Patient, Adult Children  Discharge Placement                      Patient and family notified of of transfer: 02/22/23  Discharge Plan and Services Additional resources added to the After Visit Summary for   In-house Referral: Clinical Social Work Discharge Planning Services: CM Consult            DME Arranged: Dan Humphreys rolling DME Agency: [employer] Date DME Agency Contacted: 02/19/23   Representative spoke with at DME Agency: Ian Malkin            Social Determinants of Health (SDOH) Interventions SDOH Screenings   Food Insecurity: No Food Insecurity (02/19/2023)  Housing: Low Risk  (02/19/2023)  Transportation Needs: No Transportation Needs (02/19/2023)  Utilities: Not At Risk (02/19/2023)  Tobacco Use: High Risk (02/18/2023)     Readmission Risk Interventions    02/22/2023    9:15 AM  Readmission Risk Prevention Plan  Post Dischage Appt Complete  Medication Screening Complete  Transportation Screening Complete

## 2023-02-23 DIAGNOSIS — E1165 Type 2 diabetes mellitus with hyperglycemia: Secondary | ICD-10-CM | POA: Diagnosis not present

## 2023-02-23 DIAGNOSIS — I10 Essential (primary) hypertension: Secondary | ICD-10-CM | POA: Diagnosis not present

## 2023-02-24 DIAGNOSIS — I5023 Acute on chronic systolic (congestive) heart failure: Secondary | ICD-10-CM | POA: Diagnosis not present

## 2023-02-24 DIAGNOSIS — M1990 Unspecified osteoarthritis, unspecified site: Secondary | ICD-10-CM | POA: Diagnosis not present

## 2023-02-24 DIAGNOSIS — N179 Acute kidney failure, unspecified: Secondary | ICD-10-CM | POA: Diagnosis not present

## 2023-02-24 DIAGNOSIS — I255 Ischemic cardiomyopathy: Secondary | ICD-10-CM | POA: Diagnosis not present

## 2023-02-24 DIAGNOSIS — I2510 Atherosclerotic heart disease of native coronary artery without angina pectoris: Secondary | ICD-10-CM | POA: Diagnosis not present

## 2023-02-24 DIAGNOSIS — E1122 Type 2 diabetes mellitus with diabetic chronic kidney disease: Secondary | ICD-10-CM | POA: Diagnosis not present

## 2023-02-24 DIAGNOSIS — N1831 Chronic kidney disease, stage 3a: Secondary | ICD-10-CM | POA: Diagnosis not present

## 2023-02-24 DIAGNOSIS — J189 Pneumonia, unspecified organism: Secondary | ICD-10-CM | POA: Diagnosis not present

## 2023-02-24 DIAGNOSIS — I130 Hypertensive heart and chronic kidney disease with heart failure and stage 1 through stage 4 chronic kidney disease, or unspecified chronic kidney disease: Secondary | ICD-10-CM | POA: Diagnosis not present

## 2023-02-27 DIAGNOSIS — G894 Chronic pain syndrome: Secondary | ICD-10-CM | POA: Diagnosis not present

## 2023-02-27 DIAGNOSIS — M545 Low back pain, unspecified: Secondary | ICD-10-CM | POA: Diagnosis not present

## 2023-02-28 DIAGNOSIS — Z09 Encounter for follow-up examination after completed treatment for conditions other than malignant neoplasm: Secondary | ICD-10-CM | POA: Diagnosis not present

## 2023-02-28 DIAGNOSIS — Z299 Encounter for prophylactic measures, unspecified: Secondary | ICD-10-CM | POA: Diagnosis not present

## 2023-02-28 DIAGNOSIS — I509 Heart failure, unspecified: Secondary | ICD-10-CM | POA: Diagnosis not present

## 2023-02-28 DIAGNOSIS — I10 Essential (primary) hypertension: Secondary | ICD-10-CM | POA: Diagnosis not present

## 2023-03-02 DIAGNOSIS — J189 Pneumonia, unspecified organism: Secondary | ICD-10-CM | POA: Diagnosis not present

## 2023-03-02 DIAGNOSIS — N179 Acute kidney failure, unspecified: Secondary | ICD-10-CM | POA: Diagnosis not present

## 2023-03-02 DIAGNOSIS — I2510 Atherosclerotic heart disease of native coronary artery without angina pectoris: Secondary | ICD-10-CM | POA: Diagnosis not present

## 2023-03-02 DIAGNOSIS — N1831 Chronic kidney disease, stage 3a: Secondary | ICD-10-CM | POA: Diagnosis not present

## 2023-03-02 DIAGNOSIS — E1122 Type 2 diabetes mellitus with diabetic chronic kidney disease: Secondary | ICD-10-CM | POA: Diagnosis not present

## 2023-03-02 DIAGNOSIS — M1990 Unspecified osteoarthritis, unspecified site: Secondary | ICD-10-CM | POA: Diagnosis not present

## 2023-03-02 DIAGNOSIS — I255 Ischemic cardiomyopathy: Secondary | ICD-10-CM | POA: Diagnosis not present

## 2023-03-02 DIAGNOSIS — I5023 Acute on chronic systolic (congestive) heart failure: Secondary | ICD-10-CM | POA: Diagnosis not present

## 2023-03-02 DIAGNOSIS — I130 Hypertensive heart and chronic kidney disease with heart failure and stage 1 through stage 4 chronic kidney disease, or unspecified chronic kidney disease: Secondary | ICD-10-CM | POA: Diagnosis not present

## 2023-03-05 DIAGNOSIS — M1990 Unspecified osteoarthritis, unspecified site: Secondary | ICD-10-CM | POA: Diagnosis not present

## 2023-03-05 DIAGNOSIS — J189 Pneumonia, unspecified organism: Secondary | ICD-10-CM | POA: Diagnosis not present

## 2023-03-05 DIAGNOSIS — E1122 Type 2 diabetes mellitus with diabetic chronic kidney disease: Secondary | ICD-10-CM | POA: Diagnosis not present

## 2023-03-05 DIAGNOSIS — I255 Ischemic cardiomyopathy: Secondary | ICD-10-CM | POA: Diagnosis not present

## 2023-03-05 DIAGNOSIS — I5023 Acute on chronic systolic (congestive) heart failure: Secondary | ICD-10-CM | POA: Diagnosis not present

## 2023-03-05 DIAGNOSIS — I130 Hypertensive heart and chronic kidney disease with heart failure and stage 1 through stage 4 chronic kidney disease, or unspecified chronic kidney disease: Secondary | ICD-10-CM | POA: Diagnosis not present

## 2023-03-05 DIAGNOSIS — N1831 Chronic kidney disease, stage 3a: Secondary | ICD-10-CM | POA: Diagnosis not present

## 2023-03-05 DIAGNOSIS — I2510 Atherosclerotic heart disease of native coronary artery without angina pectoris: Secondary | ICD-10-CM | POA: Diagnosis not present

## 2023-03-05 DIAGNOSIS — N179 Acute kidney failure, unspecified: Secondary | ICD-10-CM | POA: Diagnosis not present

## 2023-03-06 DIAGNOSIS — I130 Hypertensive heart and chronic kidney disease with heart failure and stage 1 through stage 4 chronic kidney disease, or unspecified chronic kidney disease: Secondary | ICD-10-CM | POA: Diagnosis not present

## 2023-03-06 DIAGNOSIS — E1122 Type 2 diabetes mellitus with diabetic chronic kidney disease: Secondary | ICD-10-CM | POA: Diagnosis not present

## 2023-03-06 DIAGNOSIS — I5023 Acute on chronic systolic (congestive) heart failure: Secondary | ICD-10-CM | POA: Diagnosis not present

## 2023-03-06 DIAGNOSIS — N1831 Chronic kidney disease, stage 3a: Secondary | ICD-10-CM | POA: Diagnosis not present

## 2023-03-07 DIAGNOSIS — I255 Ischemic cardiomyopathy: Secondary | ICD-10-CM | POA: Diagnosis not present

## 2023-03-07 DIAGNOSIS — I2510 Atherosclerotic heart disease of native coronary artery without angina pectoris: Secondary | ICD-10-CM | POA: Diagnosis not present

## 2023-03-07 DIAGNOSIS — I5023 Acute on chronic systolic (congestive) heart failure: Secondary | ICD-10-CM | POA: Diagnosis not present

## 2023-03-07 DIAGNOSIS — M1990 Unspecified osteoarthritis, unspecified site: Secondary | ICD-10-CM | POA: Diagnosis not present

## 2023-03-07 DIAGNOSIS — N179 Acute kidney failure, unspecified: Secondary | ICD-10-CM | POA: Diagnosis not present

## 2023-03-07 DIAGNOSIS — J189 Pneumonia, unspecified organism: Secondary | ICD-10-CM | POA: Diagnosis not present

## 2023-03-07 DIAGNOSIS — N1831 Chronic kidney disease, stage 3a: Secondary | ICD-10-CM | POA: Diagnosis not present

## 2023-03-07 DIAGNOSIS — I130 Hypertensive heart and chronic kidney disease with heart failure and stage 1 through stage 4 chronic kidney disease, or unspecified chronic kidney disease: Secondary | ICD-10-CM | POA: Diagnosis not present

## 2023-03-07 DIAGNOSIS — E1122 Type 2 diabetes mellitus with diabetic chronic kidney disease: Secondary | ICD-10-CM | POA: Diagnosis not present

## 2023-03-08 DIAGNOSIS — N179 Acute kidney failure, unspecified: Secondary | ICD-10-CM | POA: Diagnosis not present

## 2023-03-08 DIAGNOSIS — M1990 Unspecified osteoarthritis, unspecified site: Secondary | ICD-10-CM | POA: Diagnosis not present

## 2023-03-08 DIAGNOSIS — I130 Hypertensive heart and chronic kidney disease with heart failure and stage 1 through stage 4 chronic kidney disease, or unspecified chronic kidney disease: Secondary | ICD-10-CM | POA: Diagnosis not present

## 2023-03-08 DIAGNOSIS — I509 Heart failure, unspecified: Secondary | ICD-10-CM | POA: Diagnosis not present

## 2023-03-08 DIAGNOSIS — I10 Essential (primary) hypertension: Secondary | ICD-10-CM | POA: Diagnosis not present

## 2023-03-08 DIAGNOSIS — Z299 Encounter for prophylactic measures, unspecified: Secondary | ICD-10-CM | POA: Diagnosis not present

## 2023-03-08 DIAGNOSIS — I2510 Atherosclerotic heart disease of native coronary artery without angina pectoris: Secondary | ICD-10-CM | POA: Diagnosis not present

## 2023-03-08 DIAGNOSIS — J189 Pneumonia, unspecified organism: Secondary | ICD-10-CM | POA: Diagnosis not present

## 2023-03-08 DIAGNOSIS — R5383 Other fatigue: Secondary | ICD-10-CM | POA: Diagnosis not present

## 2023-03-08 DIAGNOSIS — N1831 Chronic kidney disease, stage 3a: Secondary | ICD-10-CM | POA: Diagnosis not present

## 2023-03-08 DIAGNOSIS — I5023 Acute on chronic systolic (congestive) heart failure: Secondary | ICD-10-CM | POA: Diagnosis not present

## 2023-03-08 DIAGNOSIS — E1122 Type 2 diabetes mellitus with diabetic chronic kidney disease: Secondary | ICD-10-CM | POA: Diagnosis not present

## 2023-03-08 DIAGNOSIS — I255 Ischemic cardiomyopathy: Secondary | ICD-10-CM | POA: Diagnosis not present

## 2023-03-13 DIAGNOSIS — I5023 Acute on chronic systolic (congestive) heart failure: Secondary | ICD-10-CM | POA: Diagnosis not present

## 2023-03-13 DIAGNOSIS — J189 Pneumonia, unspecified organism: Secondary | ICD-10-CM | POA: Diagnosis not present

## 2023-03-13 DIAGNOSIS — I2510 Atherosclerotic heart disease of native coronary artery without angina pectoris: Secondary | ICD-10-CM | POA: Diagnosis not present

## 2023-03-13 DIAGNOSIS — N179 Acute kidney failure, unspecified: Secondary | ICD-10-CM | POA: Diagnosis not present

## 2023-03-13 DIAGNOSIS — I255 Ischemic cardiomyopathy: Secondary | ICD-10-CM | POA: Diagnosis not present

## 2023-03-13 DIAGNOSIS — I130 Hypertensive heart and chronic kidney disease with heart failure and stage 1 through stage 4 chronic kidney disease, or unspecified chronic kidney disease: Secondary | ICD-10-CM | POA: Diagnosis not present

## 2023-03-13 DIAGNOSIS — M1990 Unspecified osteoarthritis, unspecified site: Secondary | ICD-10-CM | POA: Diagnosis not present

## 2023-03-13 DIAGNOSIS — N1831 Chronic kidney disease, stage 3a: Secondary | ICD-10-CM | POA: Diagnosis not present

## 2023-03-13 DIAGNOSIS — E1122 Type 2 diabetes mellitus with diabetic chronic kidney disease: Secondary | ICD-10-CM | POA: Diagnosis not present

## 2023-03-14 DIAGNOSIS — J189 Pneumonia, unspecified organism: Secondary | ICD-10-CM | POA: Diagnosis not present

## 2023-03-14 DIAGNOSIS — N179 Acute kidney failure, unspecified: Secondary | ICD-10-CM | POA: Diagnosis not present

## 2023-03-14 DIAGNOSIS — I255 Ischemic cardiomyopathy: Secondary | ICD-10-CM | POA: Diagnosis not present

## 2023-03-14 DIAGNOSIS — M1990 Unspecified osteoarthritis, unspecified site: Secondary | ICD-10-CM | POA: Diagnosis not present

## 2023-03-14 DIAGNOSIS — I130 Hypertensive heart and chronic kidney disease with heart failure and stage 1 through stage 4 chronic kidney disease, or unspecified chronic kidney disease: Secondary | ICD-10-CM | POA: Diagnosis not present

## 2023-03-14 DIAGNOSIS — I2510 Atherosclerotic heart disease of native coronary artery without angina pectoris: Secondary | ICD-10-CM | POA: Diagnosis not present

## 2023-03-14 DIAGNOSIS — N1831 Chronic kidney disease, stage 3a: Secondary | ICD-10-CM | POA: Diagnosis not present

## 2023-03-14 DIAGNOSIS — E1122 Type 2 diabetes mellitus with diabetic chronic kidney disease: Secondary | ICD-10-CM | POA: Diagnosis not present

## 2023-03-14 DIAGNOSIS — I5023 Acute on chronic systolic (congestive) heart failure: Secondary | ICD-10-CM | POA: Diagnosis not present

## 2023-03-23 DIAGNOSIS — I5023 Acute on chronic systolic (congestive) heart failure: Secondary | ICD-10-CM | POA: Diagnosis not present

## 2023-03-23 DIAGNOSIS — J189 Pneumonia, unspecified organism: Secondary | ICD-10-CM | POA: Diagnosis not present

## 2023-03-23 DIAGNOSIS — N179 Acute kidney failure, unspecified: Secondary | ICD-10-CM | POA: Diagnosis not present

## 2023-03-23 DIAGNOSIS — I130 Hypertensive heart and chronic kidney disease with heart failure and stage 1 through stage 4 chronic kidney disease, or unspecified chronic kidney disease: Secondary | ICD-10-CM | POA: Diagnosis not present

## 2023-03-23 DIAGNOSIS — E1122 Type 2 diabetes mellitus with diabetic chronic kidney disease: Secondary | ICD-10-CM | POA: Diagnosis not present

## 2023-03-23 DIAGNOSIS — N1831 Chronic kidney disease, stage 3a: Secondary | ICD-10-CM | POA: Diagnosis not present

## 2023-03-23 DIAGNOSIS — I2510 Atherosclerotic heart disease of native coronary artery without angina pectoris: Secondary | ICD-10-CM | POA: Diagnosis not present

## 2023-03-23 DIAGNOSIS — M1990 Unspecified osteoarthritis, unspecified site: Secondary | ICD-10-CM | POA: Diagnosis not present

## 2023-03-23 DIAGNOSIS — I255 Ischemic cardiomyopathy: Secondary | ICD-10-CM | POA: Diagnosis not present

## 2023-03-26 ENCOUNTER — Ambulatory Visit: Payer: Medicare HMO | Attending: Nurse Practitioner | Admitting: Nurse Practitioner

## 2023-03-26 VITALS — BP 136/68 | HR 60 | Ht 74.0 in | Wt 218.0 lb

## 2023-03-26 DIAGNOSIS — Z95810 Presence of automatic (implantable) cardiac defibrillator: Secondary | ICD-10-CM | POA: Diagnosis not present

## 2023-03-26 DIAGNOSIS — E1165 Type 2 diabetes mellitus with hyperglycemia: Secondary | ICD-10-CM | POA: Diagnosis not present

## 2023-03-26 DIAGNOSIS — E785 Hyperlipidemia, unspecified: Secondary | ICD-10-CM | POA: Diagnosis not present

## 2023-03-26 DIAGNOSIS — I2510 Atherosclerotic heart disease of native coronary artery without angina pectoris: Secondary | ICD-10-CM | POA: Diagnosis not present

## 2023-03-26 DIAGNOSIS — I5032 Chronic diastolic (congestive) heart failure: Secondary | ICD-10-CM | POA: Diagnosis not present

## 2023-03-26 DIAGNOSIS — I10 Essential (primary) hypertension: Secondary | ICD-10-CM

## 2023-03-26 DIAGNOSIS — I5022 Chronic systolic (congestive) heart failure: Secondary | ICD-10-CM

## 2023-03-26 MED ORDER — ROSUVASTATIN CALCIUM 10 MG PO TABS: 10.0000 mg | ORAL_TABLET | Freq: Every day | ORAL | 1 refills | Status: DC

## 2023-03-26 NOTE — Patient Instructions (Signed)
Medication Instructions:  Your physician has recommended you make the following change in your medication:  Please stop pravastatin  Please start rosuvastatin 10 mg daily   Labwork: In 2 months   Testing/Procedures: None   Follow-Up: Your physician recommends that you schedule a follow-up appointment in: 2-3 months   Any Other Special Instructions Will Be Listed Below (If Applicable).  If you need a refill on your cardiac medications before your next appointment, please call your pharmacy.

## 2023-03-26 NOTE — Progress Notes (Unsigned)
  Cardiology Office Note:  .   Date:  03/26/2023  ID:  Anthony Adams, DOB 1951/01/29, MRN [id number] PCP: Ignatius Specking, MD  [facility] Providers Cardiologist:  Lewayne Bunting, MD Electrophysiologist:  Lewayne Bunting, MD { Click to update primary MD,subspecialty MD or APP then REFRESH:1}   History of Present Illness: .   Anthony Adams is a 72 y.o. male with a PMH of CAD, s/p CABG x 5, chronic systolic CHF, history of ICD, dyslipidemia, hypertension, type 2 diabetes, AKI, and orthostatic dizziness, who presents today for hospital follow-up.  History of remote stenting, received a bare-metal stent to LAD with angioplasty of a diagonal in 2007.  Has not been seen by general cardiology since 2012.  He has been closely followed by Dr. Ladona Ridgel.  Last seen by Dr. Ladona Ridgel via telehealth on September 07, 2021.  He was overall doing well at the time.  Recent hospital admission for acute chronic congestive heart failure.  He initially presented to the ED for evaluation of a fall.  Patient's sister reported that he felt early 1 morning going to the bathroom, had been confused leading up to hospital admission.  He had been having episodes of falling on his knees.  Patient also noted having shortness of breath, productive cough in the past 2 days.  CT of the head was negative for anything acute along with CT of the spine.  A 2.7 cm right thyroid nodule was noted and recommended thyroid ultrasound.  CXR revealed streaky left lung base opacity, atelectasis versus pneumonia with prior granulomatous disease.  Carotid Dopplers demonstrated minimal plaque buildup with no hemodynamically significant stenosis.  Echocardiogram revealed EF 55 to 60%, grade 1 DD, no significant valvular abnormalities.  Orthostatic vital signs revealed drop in systolic BP and increasing heart rate with positional change, patient was asymptomatic and his orthostatic vital signs improved.  It was noted that he had significant diarrhea/GI losses prior  to admission.  Was treated for community-acquired pneumonia.   Today he presents for hospital follow-up.  He states...   Stop pravastatin and switch to rouvastatain   ROS: ***  Studies Reviewed: .        *** Risk Assessment/Calculations:   {Does this patient have ATRIAL FIBRILLATION?[phone]} No BP recorded.  {Refresh Note OR Click here to enter BP  :1}***       Physical Exam:   VS:  There were no vitals taken for this visit.   Wt Readings from Last 3 Encounters:  02/22/23 210 lb 8.6 oz (95.5 kg)  05/25/22 225 lb (102.1 kg)  09/07/21 233 lb (105.7 kg)    GEN: Well nourished, well developed in no acute distress NECK: No JVD; No carotid bruits CARDIAC: ***RRR, no murmurs, rubs, gallops RESPIRATORY:  Clear to auscultation without rales, wheezing or rhonchi  ABDOMEN: Soft, non-tender, non-distended EXTREMITIES:  No edema; No deformity   ASSESSMENT AND PLAN: .   ***    {Are you ordering a CV Procedure (e.g. stress test, cath, DCCV, TEE, etc)?   Press F2        :[id number]}  Dispo: ***  Signed, Sharlene Dory, NP

## 2023-03-27 DIAGNOSIS — Z79899 Other long term (current) drug therapy: Secondary | ICD-10-CM | POA: Diagnosis not present

## 2023-03-27 DIAGNOSIS — G894 Chronic pain syndrome: Secondary | ICD-10-CM | POA: Diagnosis not present

## 2023-03-27 DIAGNOSIS — Z79891 Long term (current) use of opiate analgesic: Secondary | ICD-10-CM | POA: Diagnosis not present

## 2023-03-27 DIAGNOSIS — M545 Low back pain, unspecified: Secondary | ICD-10-CM | POA: Diagnosis not present

## 2023-04-16 DIAGNOSIS — I739 Peripheral vascular disease, unspecified: Secondary | ICD-10-CM | POA: Diagnosis not present

## 2023-04-16 DIAGNOSIS — I700 Atherosclerosis of aorta: Secondary | ICD-10-CM | POA: Diagnosis not present

## 2023-04-16 DIAGNOSIS — D692 Other nonthrombocytopenic purpura: Secondary | ICD-10-CM | POA: Diagnosis not present

## 2023-04-16 DIAGNOSIS — M19071 Primary osteoarthritis, right ankle and foot: Secondary | ICD-10-CM | POA: Diagnosis not present

## 2023-04-16 DIAGNOSIS — M79671 Pain in right foot: Secondary | ICD-10-CM | POA: Diagnosis not present

## 2023-04-16 DIAGNOSIS — M7989 Other specified soft tissue disorders: Secondary | ICD-10-CM | POA: Diagnosis not present

## 2023-04-16 DIAGNOSIS — E1159 Type 2 diabetes mellitus with other circulatory complications: Secondary | ICD-10-CM | POA: Diagnosis not present

## 2023-04-16 DIAGNOSIS — I152 Hypertension secondary to endocrine disorders: Secondary | ICD-10-CM | POA: Diagnosis not present

## 2023-04-16 DIAGNOSIS — Z299 Encounter for prophylactic measures, unspecified: Secondary | ICD-10-CM | POA: Diagnosis not present

## 2023-04-16 DIAGNOSIS — M2011 Hallux valgus (acquired), right foot: Secondary | ICD-10-CM | POA: Diagnosis not present

## 2023-04-16 DIAGNOSIS — I10 Essential (primary) hypertension: Secondary | ICD-10-CM | POA: Diagnosis not present

## 2023-04-16 DIAGNOSIS — M7731 Calcaneal spur, right foot: Secondary | ICD-10-CM | POA: Diagnosis not present

## 2023-04-25 DIAGNOSIS — M545 Low back pain, unspecified: Secondary | ICD-10-CM | POA: Diagnosis not present

## 2023-04-25 DIAGNOSIS — G894 Chronic pain syndrome: Secondary | ICD-10-CM | POA: Diagnosis not present

## 2023-04-25 DIAGNOSIS — I10 Essential (primary) hypertension: Secondary | ICD-10-CM | POA: Diagnosis not present

## 2023-04-25 DIAGNOSIS — E1165 Type 2 diabetes mellitus with hyperglycemia: Secondary | ICD-10-CM | POA: Diagnosis not present

## 2023-06-12 ENCOUNTER — Ambulatory Visit: Payer: Medicare (Managed Care) | Attending: Internal Medicine | Admitting: Internal Medicine

## 2023-06-12 NOTE — Progress Notes (Signed)
 Erroneous encounter - please disregard.

## 2023-06-24 DIAGNOSIS — I10 Essential (primary) hypertension: Secondary | ICD-10-CM | POA: Diagnosis not present

## 2023-06-24 DIAGNOSIS — E1165 Type 2 diabetes mellitus with hyperglycemia: Secondary | ICD-10-CM | POA: Diagnosis not present

## 2023-06-26 DIAGNOSIS — M545 Low back pain, unspecified: Secondary | ICD-10-CM | POA: Diagnosis not present

## 2023-06-26 DIAGNOSIS — G894 Chronic pain syndrome: Secondary | ICD-10-CM | POA: Diagnosis not present

## 2023-07-18 DIAGNOSIS — Z299 Encounter for prophylactic measures, unspecified: Secondary | ICD-10-CM | POA: Diagnosis not present

## 2023-07-18 DIAGNOSIS — E1165 Type 2 diabetes mellitus with hyperglycemia: Secondary | ICD-10-CM | POA: Diagnosis not present

## 2023-07-18 DIAGNOSIS — I10 Essential (primary) hypertension: Secondary | ICD-10-CM | POA: Diagnosis not present

## 2023-07-18 DIAGNOSIS — I509 Heart failure, unspecified: Secondary | ICD-10-CM | POA: Diagnosis not present

## 2023-07-23 DIAGNOSIS — G894 Chronic pain syndrome: Secondary | ICD-10-CM | POA: Diagnosis not present

## 2023-07-23 DIAGNOSIS — M545 Low back pain, unspecified: Secondary | ICD-10-CM | POA: Diagnosis not present

## 2023-07-25 DIAGNOSIS — E1165 Type 2 diabetes mellitus with hyperglycemia: Secondary | ICD-10-CM | POA: Diagnosis not present

## 2023-07-25 DIAGNOSIS — I10 Essential (primary) hypertension: Secondary | ICD-10-CM | POA: Diagnosis not present

## 2023-08-14 ENCOUNTER — Other Ambulatory Visit: Payer: Self-pay | Admitting: Nurse Practitioner

## 2023-08-25 DIAGNOSIS — I10 Essential (primary) hypertension: Secondary | ICD-10-CM | POA: Diagnosis not present

## 2023-08-25 DIAGNOSIS — E1165 Type 2 diabetes mellitus with hyperglycemia: Secondary | ICD-10-CM | POA: Diagnosis not present

## 2023-09-16 ENCOUNTER — Other Ambulatory Visit: Payer: Self-pay | Admitting: Nurse Practitioner

## 2023-09-24 DIAGNOSIS — E1165 Type 2 diabetes mellitus with hyperglycemia: Secondary | ICD-10-CM | POA: Diagnosis not present

## 2023-09-24 DIAGNOSIS — I10 Essential (primary) hypertension: Secondary | ICD-10-CM | POA: Diagnosis not present

## 2023-09-25 ENCOUNTER — Other Ambulatory Visit: Payer: Self-pay

## 2023-09-25 MED ORDER — ROSUVASTATIN CALCIUM 10 MG PO TABS: 10.0000 mg | ORAL_TABLET | Freq: Every day | ORAL | 1 refills | Status: DC

## 2023-10-17 DIAGNOSIS — M545 Low back pain, unspecified: Secondary | ICD-10-CM | POA: Diagnosis not present

## 2023-10-17 DIAGNOSIS — M48062 Spinal stenosis, lumbar region with neurogenic claudication: Secondary | ICD-10-CM | POA: Diagnosis not present

## 2023-10-17 DIAGNOSIS — G894 Chronic pain syndrome: Secondary | ICD-10-CM | POA: Diagnosis not present

## 2023-10-25 DIAGNOSIS — I10 Essential (primary) hypertension: Secondary | ICD-10-CM | POA: Diagnosis not present

## 2023-10-25 DIAGNOSIS — E1165 Type 2 diabetes mellitus with hyperglycemia: Secondary | ICD-10-CM | POA: Diagnosis not present

## 2023-11-06 DIAGNOSIS — R5383 Other fatigue: Secondary | ICD-10-CM | POA: Diagnosis not present

## 2023-11-06 DIAGNOSIS — I10 Essential (primary) hypertension: Secondary | ICD-10-CM | POA: Diagnosis not present

## 2023-11-06 DIAGNOSIS — N1831 Chronic kidney disease, stage 3a: Secondary | ICD-10-CM | POA: Diagnosis not present

## 2023-11-06 DIAGNOSIS — Z299 Encounter for prophylactic measures, unspecified: Secondary | ICD-10-CM | POA: Diagnosis not present

## 2023-11-06 DIAGNOSIS — Z0000 Encounter for general adult medical examination without abnormal findings: Secondary | ICD-10-CM | POA: Diagnosis not present

## 2023-11-06 DIAGNOSIS — I509 Heart failure, unspecified: Secondary | ICD-10-CM | POA: Diagnosis not present

## 2023-11-06 DIAGNOSIS — E119 Type 2 diabetes mellitus without complications: Secondary | ICD-10-CM | POA: Diagnosis not present

## 2023-11-06 DIAGNOSIS — E7800 Pure hypercholesterolemia, unspecified: Secondary | ICD-10-CM | POA: Diagnosis not present

## 2023-11-21 DIAGNOSIS — M5442 Lumbago with sciatica, left side: Secondary | ICD-10-CM | POA: Diagnosis not present

## 2023-11-21 DIAGNOSIS — M4727 Other spondylosis with radiculopathy, lumbosacral region: Secondary | ICD-10-CM | POA: Diagnosis not present

## 2023-11-21 DIAGNOSIS — M48062 Spinal stenosis, lumbar region with neurogenic claudication: Secondary | ICD-10-CM | POA: Diagnosis not present

## 2023-11-21 DIAGNOSIS — G894 Chronic pain syndrome: Secondary | ICD-10-CM | POA: Diagnosis not present

## 2023-11-21 DIAGNOSIS — M5441 Lumbago with sciatica, right side: Secondary | ICD-10-CM | POA: Diagnosis not present

## 2023-11-24 DIAGNOSIS — E1165 Type 2 diabetes mellitus with hyperglycemia: Secondary | ICD-10-CM | POA: Diagnosis not present

## 2023-11-24 DIAGNOSIS — I10 Essential (primary) hypertension: Secondary | ICD-10-CM | POA: Diagnosis not present

## 2023-12-19 DIAGNOSIS — M5442 Lumbago with sciatica, left side: Secondary | ICD-10-CM | POA: Diagnosis not present

## 2023-12-19 DIAGNOSIS — M48062 Spinal stenosis, lumbar region with neurogenic claudication: Secondary | ICD-10-CM | POA: Diagnosis not present

## 2023-12-19 DIAGNOSIS — G894 Chronic pain syndrome: Secondary | ICD-10-CM | POA: Diagnosis not present

## 2023-12-19 DIAGNOSIS — M5441 Lumbago with sciatica, right side: Secondary | ICD-10-CM | POA: Diagnosis not present

## 2023-12-25 DIAGNOSIS — E1165 Type 2 diabetes mellitus with hyperglycemia: Secondary | ICD-10-CM | POA: Diagnosis not present

## 2023-12-25 DIAGNOSIS — I10 Essential (primary) hypertension: Secondary | ICD-10-CM | POA: Diagnosis not present

## 2024-01-07 DIAGNOSIS — E119 Type 2 diabetes mellitus without complications: Secondary | ICD-10-CM | POA: Diagnosis not present

## 2024-01-13 ENCOUNTER — Other Ambulatory Visit: Payer: Self-pay | Admitting: Internal Medicine

## 2024-01-14 DIAGNOSIS — G894 Chronic pain syndrome: Secondary | ICD-10-CM | POA: Diagnosis not present

## 2024-01-14 DIAGNOSIS — M48061 Spinal stenosis, lumbar region without neurogenic claudication: Secondary | ICD-10-CM | POA: Diagnosis not present

## 2024-01-14 DIAGNOSIS — M47816 Spondylosis without myelopathy or radiculopathy, lumbar region: Secondary | ICD-10-CM | POA: Diagnosis not present

## 2024-01-14 DIAGNOSIS — M545 Low back pain, unspecified: Secondary | ICD-10-CM | POA: Diagnosis not present

## 2024-01-25 DIAGNOSIS — I10 Essential (primary) hypertension: Secondary | ICD-10-CM | POA: Diagnosis not present

## 2024-01-25 DIAGNOSIS — E1165 Type 2 diabetes mellitus with hyperglycemia: Secondary | ICD-10-CM | POA: Diagnosis not present

## 2024-02-11 DIAGNOSIS — G894 Chronic pain syndrome: Secondary | ICD-10-CM | POA: Diagnosis not present

## 2024-02-11 DIAGNOSIS — M545 Low back pain, unspecified: Secondary | ICD-10-CM | POA: Diagnosis not present

## 2024-02-11 DIAGNOSIS — M47816 Spondylosis without myelopathy or radiculopathy, lumbar region: Secondary | ICD-10-CM | POA: Diagnosis not present

## 2024-02-24 DIAGNOSIS — I10 Essential (primary) hypertension: Secondary | ICD-10-CM | POA: Diagnosis not present

## 2024-02-24 DIAGNOSIS — E1165 Type 2 diabetes mellitus with hyperglycemia: Secondary | ICD-10-CM | POA: Diagnosis not present

## 2024-03-07 DIAGNOSIS — N1831 Chronic kidney disease, stage 3a: Secondary | ICD-10-CM | POA: Diagnosis not present

## 2024-03-07 DIAGNOSIS — E119 Type 2 diabetes mellitus without complications: Secondary | ICD-10-CM | POA: Diagnosis not present

## 2024-03-07 DIAGNOSIS — I10 Essential (primary) hypertension: Secondary | ICD-10-CM | POA: Diagnosis not present

## 2024-03-07 DIAGNOSIS — Z299 Encounter for prophylactic measures, unspecified: Secondary | ICD-10-CM | POA: Diagnosis not present

## 2024-03-17 ENCOUNTER — Other Ambulatory Visit: Payer: Self-pay

## 2024-03-17 DIAGNOSIS — M545 Low back pain, unspecified: Secondary | ICD-10-CM | POA: Diagnosis not present

## 2024-03-17 DIAGNOSIS — M47816 Spondylosis without myelopathy or radiculopathy, lumbar region: Secondary | ICD-10-CM | POA: Diagnosis not present

## 2024-03-17 DIAGNOSIS — G894 Chronic pain syndrome: Secondary | ICD-10-CM | POA: Diagnosis not present

## 2024-03-17 DIAGNOSIS — M48061 Spinal stenosis, lumbar region without neurogenic claudication: Secondary | ICD-10-CM | POA: Diagnosis not present

## 2024-03-18 MED ORDER — ROSUVASTATIN CALCIUM 10 MG PO TABS: 10.0000 mg | ORAL_TABLET | Freq: Every day | ORAL | 0 refills | Status: DC

## 2024-04-17 ENCOUNTER — Other Ambulatory Visit: Payer: Self-pay

## 2024-04-18 ENCOUNTER — Emergency Department (HOSPITAL_COMMUNITY): Payer: Medicare (Managed Care)

## 2024-04-18 ENCOUNTER — Inpatient Hospital Stay (HOSPITAL_COMMUNITY): Payer: Medicare (Managed Care)

## 2024-04-18 ENCOUNTER — Inpatient Hospital Stay (HOSPITAL_COMMUNITY)
Admission: EM | Admit: 2024-04-18 | Discharge: 2024-04-21 | DRG: 871 | Disposition: A | Payer: Medicare (Managed Care) | Attending: Internal Medicine | Admitting: Internal Medicine

## 2024-04-18 ENCOUNTER — Other Ambulatory Visit: Payer: Self-pay

## 2024-04-18 ENCOUNTER — Encounter (HOSPITAL_COMMUNITY): Payer: Self-pay | Admitting: Family Medicine

## 2024-04-18 DIAGNOSIS — J189 Pneumonia, unspecified organism: Secondary | ICD-10-CM | POA: Diagnosis present

## 2024-04-18 DIAGNOSIS — Z955 Presence of coronary angioplasty implant and graft: Secondary | ICD-10-CM

## 2024-04-18 DIAGNOSIS — Z1152 Encounter for screening for COVID-19: Secondary | ICD-10-CM | POA: Diagnosis not present

## 2024-04-18 DIAGNOSIS — G9341 Metabolic encephalopathy: Secondary | ICD-10-CM | POA: Diagnosis present

## 2024-04-18 DIAGNOSIS — A419 Sepsis, unspecified organism: Secondary | ICD-10-CM | POA: Diagnosis present

## 2024-04-18 DIAGNOSIS — I255 Ischemic cardiomyopathy: Secondary | ICD-10-CM | POA: Diagnosis present

## 2024-04-18 DIAGNOSIS — R531 Weakness: Secondary | ICD-10-CM

## 2024-04-18 DIAGNOSIS — Z95810 Presence of automatic (implantable) cardiac defibrillator: Secondary | ICD-10-CM | POA: Diagnosis not present

## 2024-04-18 DIAGNOSIS — I10 Essential (primary) hypertension: Secondary | ICD-10-CM | POA: Diagnosis present

## 2024-04-18 DIAGNOSIS — N183 Chronic kidney disease, stage 3 unspecified: Secondary | ICD-10-CM | POA: Diagnosis present

## 2024-04-18 DIAGNOSIS — I252 Old myocardial infarction: Secondary | ICD-10-CM

## 2024-04-18 DIAGNOSIS — Z951 Presence of aortocoronary bypass graft: Secondary | ICD-10-CM | POA: Diagnosis not present

## 2024-04-18 DIAGNOSIS — R32 Unspecified urinary incontinence: Secondary | ICD-10-CM | POA: Diagnosis not present

## 2024-04-18 DIAGNOSIS — Z7984 Long term (current) use of oral hypoglycemic drugs: Secondary | ICD-10-CM

## 2024-04-18 DIAGNOSIS — Z8249 Family history of ischemic heart disease and other diseases of the circulatory system: Secondary | ICD-10-CM | POA: Diagnosis not present

## 2024-04-18 DIAGNOSIS — I2510 Atherosclerotic heart disease of native coronary artery without angina pectoris: Secondary | ICD-10-CM | POA: Diagnosis present

## 2024-04-18 DIAGNOSIS — R410 Disorientation, unspecified: Secondary | ICD-10-CM

## 2024-04-18 DIAGNOSIS — I130 Hypertensive heart and chronic kidney disease with heart failure and stage 1 through stage 4 chronic kidney disease, or unspecified chronic kidney disease: Secondary | ICD-10-CM | POA: Diagnosis present

## 2024-04-18 DIAGNOSIS — E785 Hyperlipidemia, unspecified: Secondary | ICD-10-CM | POA: Diagnosis present

## 2024-04-18 DIAGNOSIS — J188 Other pneumonia, unspecified organism: Secondary | ICD-10-CM | POA: Insufficient documentation

## 2024-04-18 DIAGNOSIS — N401 Enlarged prostate with lower urinary tract symptoms: Secondary | ICD-10-CM | POA: Diagnosis present

## 2024-04-18 DIAGNOSIS — I5022 Chronic systolic (congestive) heart failure: Secondary | ICD-10-CM | POA: Diagnosis present

## 2024-04-18 DIAGNOSIS — Z79899 Other long term (current) drug therapy: Secondary | ICD-10-CM | POA: Diagnosis not present

## 2024-04-18 DIAGNOSIS — N1831 Chronic kidney disease, stage 3a: Secondary | ICD-10-CM | POA: Diagnosis present

## 2024-04-18 DIAGNOSIS — R6520 Severe sepsis without septic shock: Secondary | ICD-10-CM | POA: Diagnosis present

## 2024-04-18 DIAGNOSIS — E1122 Type 2 diabetes mellitus with diabetic chronic kidney disease: Secondary | ICD-10-CM | POA: Diagnosis present

## 2024-04-18 DIAGNOSIS — R4781 Slurred speech: Secondary | ICD-10-CM | POA: Diagnosis present

## 2024-04-18 DIAGNOSIS — R739 Hyperglycemia, unspecified: Secondary | ICD-10-CM

## 2024-04-18 DIAGNOSIS — R4182 Altered mental status, unspecified: Secondary | ICD-10-CM

## 2024-04-18 LAB — I-STAT CHEM 8, ED
BUN: 16 mg/dL (ref 8–23)
Calcium, Ion: 1.12 mmol/L — ABNORMAL LOW (ref 1.15–1.40)
Chloride: 105 mmol/L (ref 98–111)
Creatinine, Ser: 1.4 mg/dL — ABNORMAL HIGH (ref 0.61–1.24)
Glucose, Bld: 164 mg/dL — ABNORMAL HIGH (ref 70–99)
HCT: 40 % (ref 39.0–52.0)
Hemoglobin: 13.6 g/dL (ref 13.0–17.0)
Potassium: 4.5 mmol/L (ref 3.5–5.1)
Sodium: 141 mmol/L (ref 135–145)
TCO2: 23 mmol/L (ref 22–32)

## 2024-04-18 LAB — RESP PANEL BY RT-PCR (RSV, FLU A&B, COVID)  RVPGX2
Influenza A by PCR: NEGATIVE
Influenza B by PCR: NEGATIVE
Resp Syncytial Virus by PCR: NEGATIVE
SARS Coronavirus 2 by RT PCR: NEGATIVE

## 2024-04-18 LAB — URINE DRUG SCREEN
Amphetamines: NEGATIVE
Barbiturates: NEGATIVE
Benzodiazepines: NEGATIVE
Cocaine: NEGATIVE
Fentanyl: NEGATIVE
Methadone Scn, Ur: NEGATIVE
Opiates: POSITIVE — AB
Tetrahydrocannabinol: NEGATIVE

## 2024-04-18 LAB — URINALYSIS, ROUTINE W REFLEX MICROSCOPIC
Bacteria, UA: NONE SEEN
Bilirubin Urine: NEGATIVE
Glucose, UA: NEGATIVE mg/dL
Hgb urine dipstick: NEGATIVE
Ketones, ur: NEGATIVE mg/dL
Leukocytes,Ua: NEGATIVE
Nitrite: NEGATIVE
Protein, ur: 100 mg/dL — AB
Specific Gravity, Urine: 1.015 (ref 1.005–1.030)
pH: 7 (ref 5.0–8.0)

## 2024-04-18 LAB — DIFFERENTIAL
Abs Immature Granulocytes: 0.11 K/uL — ABNORMAL HIGH (ref 0.00–0.07)
Basophils Absolute: 0 K/uL (ref 0.0–0.1)
Basophils Relative: 0 %
Eosinophils Absolute: 0 K/uL (ref 0.0–0.5)
Eosinophils Relative: 0 %
Immature Granulocytes: 1 %
Lymphocytes Relative: 4 %
Lymphs Abs: 0.6 K/uL — ABNORMAL LOW (ref 0.7–4.0)
Monocytes Absolute: 0.5 K/uL (ref 0.1–1.0)
Monocytes Relative: 4 %
Neutro Abs: 11.9 K/uL — ABNORMAL HIGH (ref 1.7–7.7)
Neutrophils Relative %: 91 %

## 2024-04-18 LAB — COMPREHENSIVE METABOLIC PANEL WITH GFR
ALT: 11 U/L (ref 0–44)
AST: 17 U/L (ref 15–41)
Albumin: 4.2 g/dL (ref 3.5–5.0)
Alkaline Phosphatase: 49 U/L (ref 38–126)
Anion gap: 17 — ABNORMAL HIGH (ref 5–15)
BUN: 14 mg/dL (ref 8–23)
CO2: 19 mmol/L — ABNORMAL LOW (ref 22–32)
Calcium: 9.1 mg/dL (ref 8.9–10.3)
Chloride: 105 mmol/L (ref 98–111)
Creatinine, Ser: 1.29 mg/dL — ABNORMAL HIGH (ref 0.61–1.24)
GFR, Estimated: 59 mL/min — ABNORMAL LOW
Glucose, Bld: 158 mg/dL — ABNORMAL HIGH (ref 70–99)
Potassium: 4.5 mmol/L (ref 3.5–5.1)
Sodium: 140 mmol/L (ref 135–145)
Total Bilirubin: 0.7 mg/dL (ref 0.0–1.2)
Total Protein: 7.2 g/dL (ref 6.5–8.1)

## 2024-04-18 LAB — PROCALCITONIN: Procalcitonin: 0.3 ng/mL

## 2024-04-18 LAB — CBC
HCT: 38.8 % — ABNORMAL LOW (ref 39.0–52.0)
Hemoglobin: 12.5 g/dL — ABNORMAL LOW (ref 13.0–17.0)
MCH: 27.3 pg (ref 26.0–34.0)
MCHC: 32.2 g/dL (ref 30.0–36.0)
MCV: 84.7 fL (ref 80.0–100.0)
Platelets: 119 K/uL — ABNORMAL LOW (ref 150–400)
RBC: 4.58 MIL/uL (ref 4.22–5.81)
RDW: 14.8 % (ref 11.5–15.5)
WBC: 13.2 K/uL — ABNORMAL HIGH (ref 4.0–10.5)
nRBC: 0.2 % (ref 0.0–0.2)

## 2024-04-18 LAB — TROPONIN T, HIGH SENSITIVITY
Troponin T High Sensitivity: 23 ng/L — ABNORMAL HIGH (ref 0–19)
Troponin T High Sensitivity: 22 ng/L — ABNORMAL HIGH (ref 0–19)

## 2024-04-18 LAB — ETHANOL: Alcohol, Ethyl (B): <15 mg/dL

## 2024-04-18 LAB — LACTIC ACID, PLASMA
Lactic Acid, Venous: 2.1 mmol/L (ref 0.5–1.9)
Lactic Acid, Venous: 2 mmol/L (ref 0.5–1.9)

## 2024-04-18 LAB — PROTIME-INR
INR: 1.1 (ref 0.8–1.2)
Prothrombin Time: 14.5 s (ref 11.4–15.2)

## 2024-04-18 LAB — GLUCOSE, CAPILLARY: Glucose-Capillary: 129 mg/dL — ABNORMAL HIGH (ref 70–99)

## 2024-04-18 LAB — APTT: aPTT: 31 s (ref 24–36)

## 2024-04-18 LAB — CBG MONITORING, ED: Glucose-Capillary: 147 mg/dL — ABNORMAL HIGH (ref 70–99)

## 2024-04-18 MED ORDER — INSULIN ASPART 100 UNIT/ML IJ SOLN
0.0000 [IU] | Freq: Three times a day (TID) | INTRAMUSCULAR | Status: DC
  Administered 2024-04-19 – 2024-04-21 (×5): 3 [IU] via SUBCUTANEOUS
  Filled 2024-04-18 (×5): qty 1

## 2024-04-18 MED ORDER — ENOXAPARIN SODIUM 40 MG/0.4ML IJ SOSY
40.0000 mg | PREFILLED_SYRINGE | INTRAMUSCULAR | Status: DC
  Administered 2024-04-18 – 2024-04-20 (×3): 40 mg via SUBCUTANEOUS
  Filled 2024-04-18 (×3): qty 0.4

## 2024-04-18 MED ORDER — LOSARTAN POTASSIUM 50 MG PO TABS
50.0000 mg | ORAL_TABLET | Freq: Every day | ORAL | Status: DC
  Administered 2024-04-19 – 2024-04-21 (×3): 50 mg via ORAL
  Filled 2024-04-18 (×3): qty 1

## 2024-04-18 MED ORDER — INSULIN GLARGINE-YFGN 100 UNIT/ML ~~LOC~~ SOLN: 20.0000 [IU] | Freq: Every day | SUBCUTANEOUS | Status: DC

## 2024-04-18 MED ORDER — ROSUVASTATIN CALCIUM 10 MG PO TABS
10.0000 mg | ORAL_TABLET | Freq: Every day | ORAL | Status: DC
  Administered 2024-04-19 – 2024-04-21 (×3): 10 mg via ORAL
  Filled 2024-04-18 (×3): qty 1

## 2024-04-18 MED ORDER — TAMSULOSIN HCL 0.4 MG PO CAPS
0.4000 mg | ORAL_CAPSULE | Freq: Every day | ORAL | Status: DC
  Administered 2024-04-19 – 2024-04-21 (×3): 0.4 mg via ORAL
  Filled 2024-04-18 (×3): qty 1

## 2024-04-18 MED ORDER — IOHEXOL 350 MG/ML SOLN
75.0000 mL | Freq: Once | INTRAVENOUS | Status: AC | PRN
  Administered 2024-04-18: 75 mL via INTRAVENOUS

## 2024-04-18 MED ORDER — PANTOPRAZOLE SODIUM 40 MG PO TBEC
40.0000 mg | DELAYED_RELEASE_TABLET | Freq: Every day | ORAL | Status: DC
  Administered 2024-04-18 – 2024-04-21 (×4): 40 mg via ORAL
  Filled 2024-04-18 (×4): qty 1

## 2024-04-18 MED ORDER — LACTATED RINGERS IV BOLUS
1000.0000 mL | Freq: Once | INTRAVENOUS | Status: AC
  Administered 2024-04-18: 1000 mL via INTRAVENOUS

## 2024-04-18 MED ORDER — SODIUM CHLORIDE 0.9 % IV SOLN
2.0000 g | INTRAVENOUS | Status: DC
  Administered 2024-04-19 – 2024-04-20 (×2): 2 g via INTRAVENOUS
  Filled 2024-04-18 (×2): qty 20

## 2024-04-18 MED ORDER — ONDANSETRON HCL 4 MG/2ML IJ SOLN: 4.0000 mg | Freq: Four times a day (QID) | INTRAMUSCULAR | Status: DC | PRN

## 2024-04-18 MED ORDER — STROKE: EARLY STAGES OF RECOVERY BOOK: Freq: Once | Status: AC

## 2024-04-18 MED ORDER — SODIUM CHLORIDE 0.9 % IV SOLN
2.0000 g | Freq: Once | INTRAVENOUS | Status: AC
  Administered 2024-04-18: 2 g via INTRAVENOUS
  Filled 2024-04-18: qty 20

## 2024-04-18 MED ORDER — INSULIN GLARGINE 100 UNIT/ML ~~LOC~~ SOLN
20.0000 [IU] | Freq: Every day | SUBCUTANEOUS | Status: DC
  Administered 2024-04-18 – 2024-04-20 (×3): 20 [IU] via SUBCUTANEOUS
  Filled 2024-04-18 (×4): qty 0.2

## 2024-04-18 MED ORDER — GABAPENTIN 300 MG PO CAPS
600.0000 mg | ORAL_CAPSULE | Freq: Four times a day (QID) | ORAL | Status: DC
  Administered 2024-04-18 – 2024-04-21 (×10): 600 mg via ORAL
  Filled 2024-04-18 (×10): qty 2

## 2024-04-18 MED ORDER — INSULIN ASPART 100 UNIT/ML IJ SOLN: 0.0000 [IU] | Freq: Every day | INTRAMUSCULAR | Status: DC

## 2024-04-18 MED ORDER — IOHEXOL 350 MG/ML SOLN: 60.0000 mL | Freq: Once | INTRAVENOUS | Status: DC | PRN

## 2024-04-18 MED ORDER — AZITHROMYCIN 250 MG PO TABS
500.0000 mg | ORAL_TABLET | Freq: Every day | ORAL | Status: DC
  Administered 2024-04-18 – 2024-04-20 (×3): 500 mg via ORAL
  Filled 2024-04-18 (×3): qty 2

## 2024-04-18 MED ORDER — ONDANSETRON HCL 4 MG PO TABS: 4.0000 mg | ORAL_TABLET | Freq: Four times a day (QID) | ORAL | Status: DC | PRN

## 2024-04-18 MED ORDER — SODIUM CHLORIDE 0.9 % IV SOLN
100.0000 mg | Freq: Once | INTRAVENOUS | Status: DC
  Filled 2024-04-18: qty 100

## 2024-04-18 MED ORDER — CARVEDILOL 12.5 MG PO TABS
12.5000 mg | ORAL_TABLET | Freq: Two times a day (BID) | ORAL | Status: DC
  Administered 2024-04-18 – 2024-04-21 (×6): 12.5 mg via ORAL
  Filled 2024-04-18 (×6): qty 1

## 2024-04-18 MED ORDER — HYDROCODONE-ACETAMINOPHEN 10-325 MG PO TABS
1.0000 | ORAL_TABLET | Freq: Four times a day (QID) | ORAL | Status: DC | PRN
  Administered 2024-04-19 – 2024-04-21 (×7): 1 via ORAL
  Filled 2024-04-18 (×8): qty 1

## 2024-04-18 MED ORDER — DUTASTERIDE 0.5 MG PO CAPS
0.5000 mg | ORAL_CAPSULE | Freq: Every day | ORAL | Status: DC
  Administered 2024-04-19 – 2024-04-21 (×3): 0.5 mg via ORAL
  Filled 2024-04-18 (×4): qty 1

## 2024-04-18 MED ORDER — TENECTEPLASE 50 MG IV KIT
PACK | INTRAVENOUS | Status: AC
  Filled 2024-04-18: qty 10

## 2024-04-18 MED ORDER — GUAIFENESIN ER 600 MG PO TB12
600.0000 mg | ORAL_TABLET | Freq: Two times a day (BID) | ORAL | Status: DC
  Administered 2024-04-18 – 2024-04-21 (×6): 600 mg via ORAL
  Filled 2024-04-18 (×6): qty 1

## 2024-04-18 MED ORDER — LACTATED RINGERS IV SOLN: INTRAVENOUS | Status: AC

## 2024-04-18 NOTE — ED Notes (Signed)
CODE STROKE activated at this time.

## 2024-04-18 NOTE — ED Provider Notes (Signed)
 " Anthony Adams Provider Note   CSN: [id number] Arrival date & time: 04/18/24  1148  An emergency department physician performed an initial assessment on this suspected stroke patient at 1215.  Patient presents with: ams and Code Stroke   Anthony Adams is a 74 y.o. male.   Patient is a 74 year old male who presents to the emergency department with his daughter secondary to onset of altered mental status, slurred speech and weakness at approximately 9:30 AM this morning.  Daughter notes that family was with him at the time and notes that this was the time of onset.  She also notes that the patient has been urinating more frequently and did urinate on himself today.  She notes that he has no history of CVA or TIA.  He did complain about some abdominal pain this morning but has no active complaints of this at this point.  He has had no chest pain or shortness of breath.  There has been no recent falls or blunt trauma.  Daughter does note that he was unstable on his feet.        Prior to Admission medications  Medication Sig Start Date End Date Taking? Authorizing Provider  acetaminophen  (TYLENOL ) 500 MG tablet Take 1,000 mg by mouth 2 (two) times daily as needed for headache.    [provider]  carvedilol  (COREG ) 12.5 MG tablet Take 1.5 tablets (18.75 mg total) by mouth 2 (two) times daily. 02/22/23   Pearlean Manus, MD  diclofenac Sodium (VOLTAREN) 1 % GEL Apply 4 g topically daily as needed for pain. Patient not taking: Reported on 03/26/2023 07/05/21   [provider]  dutasteride  (AVODART ) 0.5 MG capsule Take 0.5 mg by mouth daily. 02/10/23   [provider]  furosemide  (LASIX ) 20 MG tablet Take 20 mg by mouth daily as needed for fluid. 04/08/21   [provider]  gabapentin  (NEURONTIN ) 600 MG tablet Take 600 mg by mouth 4 (four) times daily. 11/03/16   [provider]  glipiZIDE (GLUCOTROL XL) 2.5 MG  24 hr tablet Take 2.5 mg by mouth daily. Patient not taking: Reported on 03/26/2023 12/27/22   [provider]  HYDROcodone -acetaminophen  (NORCO) 10-325 MG tablet Take 1 tablet by mouth in the morning, at noon, in the evening, and at bedtime. 05/28/15   [provider]  losartan  (COZAAR ) 50 MG tablet Take 1 tablet (50 mg total) by mouth daily. 02/23/23   Pearlean Manus, MD  metFORMIN  (GLUCOPHAGE -XR) 500 MG 24 hr tablet Take 500 mg by mouth 2 (two) times daily.  02/15/16   [provider]  metoprolol succinate (TOPROL-XL) 25 MG 24 hr tablet Take 25 mg by mouth daily. 03/25/23   [provider]  NARCAN 4 MG/0.1ML LIQD nasal spray kit Place 1 spray into the nose daily as needed (Overdose reverse). 03/05/17   [provider]  pantoprazole  (PROTONIX ) 40 MG tablet Take 40 mg by mouth daily. 12/27/22   [provider]  potassium chloride  (KLOR-CON ) 10 MEQ tablet Take 10 mEq by mouth daily. 12/27/22   [provider]  rosuvastatin  (CRESTOR ) 10 MG tablet Take 1 tablet (10 mg total) by mouth daily. 03/18/24   Mallipeddi, Vishnu P, MD  tamsulosin  (FLOMAX ) 0.4 MG CAPS capsule Take 0.4 mg by mouth daily after breakfast.  01/12/16   [provider]    Allergies: Patient has no known allergies.    Review of Systems  Constitutional:  Positive for fever.  Psychiatric/Behavioral:  Positive for confusion.   All other systems reviewed and are negative.   Updated Vital Signs BP (!) 177/83   Pulse 100   Temp (!) 101.3 F (38.5 C) (Oral)   Resp (!) 21   SpO2 98%   Physical Exam Vitals and nursing note reviewed.  Constitutional:      General: He is not in acute distress.    Appearance: Normal appearance. He is not ill-appearing.  HENT:     Head: Normocephalic and atraumatic.     Nose: Nose normal.     Mouth/Throat:     Mouth: Mucous membranes are moist.  Eyes:     Extraocular Movements: Extraocular movements intact.      Conjunctiva/sclera: Conjunctivae normal.     Pupils: Pupils are equal, round, and reactive to light.  Cardiovascular:     Rate and Rhythm: Normal rate and regular rhythm.     Pulses: Normal pulses.     Heart sounds: Normal heart sounds. No murmur heard.    No gallop.  Pulmonary:     Effort: Pulmonary effort is normal. No respiratory distress.     Breath sounds: Normal breath sounds. No stridor. No wheezing, rhonchi or rales.  Abdominal:     General: Abdomen is flat. Bowel sounds are normal. There is no distension.     Palpations: Abdomen is soft.     Tenderness: There is no abdominal tenderness. There is no guarding.  Musculoskeletal:        General: Normal range of motion.     Cervical back: Normal range of motion and neck supple. No rigidity or tenderness.  Skin:    General: Skin is warm and dry.  Neurological:     General: No focal deficit present.     Mental Status: He is alert and oriented to person, place, and time. Mental status is at baseline.     Comments: Mild dysarthria, mild sided left upper and lower extremity drift, strength 5 out of 5 in right upper and lower extremity, sensation intact distally in bilateral upper and lower extremities, unable to assess gait at this time, normal finger-nose, normal rapid altering movements, normal heel-to-shin  Psychiatric:        Mood and Affect: Mood normal.        Behavior: Behavior normal.        Thought Content: Thought content normal.        Judgment: Judgment normal.     (all labs ordered are listed, but only abnormal results are displayed) Labs Reviewed  CBC - Abnormal; Notable for the following components:      Result Value   WBC 13.2 (*)    Hemoglobin 12.5 (*)    HCT 38.8 (*)    Platelets 119 (*)    All other components within normal limits  DIFFERENTIAL - Abnormal; Notable for the following components:   Neutro Abs 11.9 (*)    Lymphs Abs 0.6 (*)    Abs Immature Granulocytes 0.11 (*)    All other components within  normal limits  URINE DRUG SCREEN - Abnormal; Notable for the following components:   Opiates POSITIVE (*)    All other components within normal limits  I-STAT CHEM 8, ED - Abnormal; Notable for the following components:   Creatinine, Ser 1.40 (*)    Glucose, Bld 164 (*)    Calcium , Ion 1.12 (*)    All other components within normal limits  CBG MONITORING, ED - Abnormal; Notable for the following components:  Glucose-Capillary 147 (*)    All other components within normal limits  CULTURE, BLOOD (ROUTINE X 2)  CULTURE, BLOOD (ROUTINE X 2)  PROTIME-INR  APTT  COMPREHENSIVE METABOLIC PANEL WITH GFR  ETHANOL  URINALYSIS, ROUTINE W REFLEX MICROSCOPIC  LACTIC ACID, PLASMA  LACTIC ACID, PLASMA  TROPONIN T, HIGH SENSITIVITY    EKG: None  Radiology: CT HEAD CODE STROKE WO CONTRAST (LKW 0-4.5h, LVO 0-24h) Result Date: 04/18/2024 EXAM: CT HEAD WITHOUT CONTRAST 04/18/2024 12:27:14 PM TECHNIQUE: CT of the head was performed without the administration of intravenous contrast. Automated exposure control, iterative reconstruction, and/or weight based adjustment of the mA/kV was utilized to reduce the radiation dose to as low as reasonably achievable. COMPARISON: 02/18/2023 CLINICAL HISTORY: Acute neurological deficit, stroke suspected. FINDINGS: BRAIN AND VENTRICLES: No acute hemorrhage. No evidence of acute infarct. Remote infarct in the right basal ganglia involving the anterior limb of the internal capsule with partial extension into the caudate and likely involving the anterior lentiform nucleus. Mild chronic microvascular ischemic changes and mild parenchymal volume loss. Calcific intracranial atherosclerosis. No hydrocephalus. No extra-axial collection. No mass effect or midline shift. ORBITS: No acute abnormality. SINUSES: Mucosal thickening in the right maxillary sinus. SOFT TISSUES AND SKULL: No acute soft tissue abnormality. No skull fracture. [place] stroke program early CT (ASPECT) score:  Ganglionic (caudate, IC, lentiform nucleus, insula, M1-M3): 7. Supraganglionic (M4-M6): 3. Total: 10. IMPRESSION: 1. No acute intracranial abnormality. 2. ASPECTS 10. 3. Remote infarct in the right basal ganglia. Electronically signed by: Donnice Mania MD 04/18/2024 12:42 PM EST RP Workstation: [id number]     .Critical Care  Performed by: Daralene Lonni BIRCH, PA-C Authorized by: Daralene Lonni BIRCH, PA-C   Critical care provider statement:    Critical care time (minutes):  45   Critical care was necessary to treat or prevent imminent or life-threatening deterioration of the following conditions:  Sepsis   Critical care was time spent personally by me on the following activities:  Development of treatment plan with patient or surrogate, discussions with consultants, evaluation of patient's response to treatment, examination of patient, ordering and review of laboratory studies, ordering and review of radiographic studies, ordering and performing treatments and interventions, pulse oximetry, re-evaluation of patient's condition and review of old charts   I assumed direction of critical care for this patient from another provider in my specialty: no     Care discussed with: admitting provider      Medications Ordered in the ED  cefTRIAXone  (ROCEPHIN ) 2 g in sodium chloride  0.9 % 100 mL IVPB (2 g Intravenous New Bag/Given 04/18/24 1325)  lactated ringers  bolus 1,000 mL (1,000 mLs Intravenous New Bag/Given 04/18/24 1250)                                    Medical Decision Making Amount and/or Complexity of Data Reviewed Labs: ordered. Radiology: ordered.  Risk Prescription drug management. Decision regarding hospitalization.   This patient presents to the ED for concern of altered mental status, left-sided weakness, dysarthria, this involves an extensive number of treatment options, and is a complaint that carries with it a high risk of complications and morbidity.  The differential  diagnosis includes CVA, TIA, sepsis, pneumonia, urinary tract infection, electrolyte derangement, acute kidney injury, dehydration, acute viral syndrome   Co morbidities that complicate the patient evaluation  CAD, CKD, dyslipidemia, hypertension, ischemic cardiomyopathy, diabetes   Additional history obtained:  Additional history  obtained from family External records from outside source obtained and reviewed including medical records   Lab Tests:  I Ordered, and personally interpreted labs.  The pertinent results include: Leukocytosis, mild anemia, creatinine at baseline, unremarkable electrolytes, normal liver function, elevated lactic acid, unremarkable urinalysis, elevated troponin, negative respiratory panel   Imaging Studies ordered:  I ordered imaging studies including CT scan head, CTA head and neck, chest x-ray I independently visualized and interpreted imaging which showed no acute intracranial process, remote lacunar infarct, no large vessel occlusion, no acute cardiopulmonary process on x-ray I agree with the radiologist interpretation   Cardiac Monitoring: / EKG:  The patient was maintained on a cardiac monitor.  I personally viewed and interpreted the cardiac monitored which showed an underlying rhythm of: Sinus tachycardia with PVCs, no ST/T wave changes, no ischemic changes, no STEMI   Consultations Obtained:  I requested consultation with the neurology, Dr. Germaine,  and discussed lab and imaging findings as well as pertinent plan - they recommend: No TNKase  at this point   Problem List / ED Course / Critical interventions / Medication management  Patient does remain stable at this time.  Weakness has resolved at this time and he is returning mentally back to his baseline.  He did meet sepsis criteria on initial presentation given his tachycardia and fever.  Source of infection is unclear at this point.  CTA head and neck did demonstrate some consolidation  within the upper lobes of the lungs and have a covered for pneumonia at this point.  Respiratory panel was negative.  Stroke alert was initially called on presentation and he was evaluated by neurology who did not recommend TNKase .  Also reconsulted them following workup and they are in agreement to forego lumbar puncture at this point as low suspicion for meningitis or encephalitis.  Patient cannot have MRI of the brain secondary to his pacemaker.  Will plan for admission to the hospital service at this point.  CT scan of the chest has been added.  Have discussed patient case with Dr. Barbra with the hospitalist service who has excepted for admission.  Have utilized slow IV fluids in this patient to avoid fluid overload as blood pressure has remained stable and he has no indication for shock. I ordered medication including IV fluids, Rocephin , Doxy for sepsis Reevaluation of the patient after these medicines showed that the patient improved I have reviewed the patients home medicines and have made adjustments as needed   Social Determinants of Health:  None   Test / Admission - Considered:  Admission     Final diagnoses:  None    ED Discharge Orders     None          Daralene Lonni JONETTA DEVONNA 04/18/24 1608    Suzette Pac, MD 04/19/24 1847  "

## 2024-04-18 NOTE — ED Notes (Signed)
PT gone to CT

## 2024-04-18 NOTE — Consult Note (Signed)
 [facility] Telemedicine Consult   Requesting Provider: Suzette Consult Participants: nurse Location of the provider: [facility] Location of the patient: [facility]  This consult was provided via telemedicine with 2-way video and audio communication. The patient/family was informed that care would be provided in this way and agreed to receive care in this manner.    Chief Complaint:   HPI: 74 year old male with a history of CAD and HFimpEF s/p AICD placement, HLD, CKD, HTN who was with family this morning and was noted to be acutely confused.  Became incontinent of urine.  EMS was called.  Was felt to have right sided weakness as well.  Patient brought in as a code stroke.  LKW: 0930 on 04/18/2024 tnk given?: No, nondisabling symptoms IR Thrombectomy? No, Exam not consistent with LVO Modified Rankin Scale: 1-No significant post stroke disability and can perform usual duties with stroke symptoms Time of teleneurologist evaluation: 1222  Exam: Vitals:   04/18/24 1251 04/18/24 1300  BP:  (!) 177/83  Pulse:  100  Resp:  (!) 21  Temp: (!) 101.3 F (38.5 C)   SpO2:  98%    General: NAD  1A: Level of Consciousness - 0 1B: Ask Month and Age - 1 1C: 'Blink Eyes' & 'Squeeze Hands' - 0 2: Test Horizontal Extraocular Movements - 0 3: Test Visual Fields - 0 4: Test Facial Palsy - 1  (mild decrease right NLF) 5A: Test Left Arm Motor Drift - 0 5B: Test Right Arm Motor Drift - 0 6A: Test Left Leg Motor Drift - 0 6B: Test Right Leg Motor Drift - 1 7: Test Limb Ataxia - 0 8: Test Sensation - 0 9: Test Language/Aphasia- 0 10: Test Dysarthria - 1 11: Test Extinction/Inattention - 0 NIHSS score: 3   Imaging Reviewed:  CT HEAD WITHOUT CONTRAST 04/18/2024 12:27:14 PM   TECHNIQUE: CT of the head was performed without the administration of intravenous contrast. Automated exposure control, iterative reconstruction, and/or weight based adjustment of the mA/kV was utilized to reduce the  radiation dose to as low as reasonably achievable.   COMPARISON: 02/18/2023   CLINICAL HISTORY: Acute neurological deficit, stroke suspected.   FINDINGS:   BRAIN AND VENTRICLES: No acute hemorrhage. No evidence of acute infarct. Remote infarct in the right basal ganglia involving the anterior limb of the internal capsule with partial extension into the caudate and likely involving the anterior lentiform nucleus. Mild chronic microvascular ischemic changes and mild parenchymal volume loss. Calcific intracranial atherosclerosis. No hydrocephalus. No extra-axial collection. No mass effect or midline shift.   ORBITS: No acute abnormality.   SINUSES: Mucosal thickening in the right maxillary sinus.   SOFT TISSUES AND SKULL: No acute soft tissue abnormality. No skull fracture.   [place] stroke program early CT (ASPECT) score: Ganglionic (caudate, IC, lentiform nucleus, insula, M1-M3): 7. Supraganglionic (M4-M6): 3. Total: 10.   IMPRESSION: 1. No acute intracranial abnormality. 2. ASPECTS 10. 3. Remote infarct in the right basal ganglia.  Labs reviewed in epic and pertinent values follow: 147   Assessment: 74 year old male with a history of CAD and HFimpEF s/p AICD placement, HLD, CKD, HTN who was with family this morning and was noted to be acutely confused.  Became incontinent of urine.  Was noted by EMS to have focal weakness.  It seems patient has had some improvement since presentation.  Concern that this may not represent an acute infarct but at this point with nondisabling symptoms therefore not a thrombectomy candidate.  Metabolic and cardiac causes should be considered as well.  Head CT personally reviewed and shows no acute changes.    Recommendations:  ASA 325mg  now Would continue stroke w/u with MRI of the head without contrast.  If indicative of an acute infarct, would pursue vascular studies and remainder of stroke work up.   Would pursue other possible  etiologies as well to include infection, metabolic abnormalities and possible cardiac causes.   Telemetry  Case discussed with Rigney, PA  This patient is receiving care for possible acute neurological changes. Care by this provider at the time of service included time for direct evaluation via telemedicine, review of medical records, imaging studies and discussion of findings with providers, the patient and/or family.  Sonny Hock, MD Neurology   If 8pm- 8am, please page neurology on call as listed in AMION.

## 2024-04-18 NOTE — ED Triage Notes (Signed)
 Pt comes in for AMS. Pt is unaware of times, place or situation.   Pt is normal continent. Pt is no incontinent and urinated on himself multiples.    S/s started around 0930.   No hx of stroke and seizures. A&Ox4.    PA in triage to evaluate pt.

## 2024-04-18 NOTE — H&P (Signed)
 " History and Physical    Patient: Anthony Adams FMW:[id number] DOB: 1950/07/05 DOA: 04/18/2024 DOS: the patient was seen and examined on 04/18/2024 PCP: Rosamond Leta NOVAK, MD  Patient coming from: Home  Chief Complaint:  Chief Complaint  Patient presents with   ams   Code Stroke   HPI: [name] is a 74 y.o. male with medical history significant of HFrEF, h.o NSTEMI, CAD, HTN, DM2, CKD stage3. He presents to the hospital with AMS, confusion, slurred speech at 9:30am.  Onset was sudden.  The patient's daughter was present at the time of onset and noted the time.  She has noted that he has been urinating more frequently and did have an episode of incontinence today.  No history of CVA or TIA.  Denies chest pain or shortness of breath.  Code stroke was immediately activated the patient had a negative CT scan.  On return from CT, he was noted to be tachycardic and have a fever.  CT of the chest shows multifocal pneumonia.  Patient denies cough, shortness of breath, chest pain.  No focal deficits of his extremities.  Review of Systems: As mentioned in the history of present illness. All other systems reviewed and are negative. Past Medical History:  Diagnosis Date   AICD (automatic cardioverter/defibrillator) present 08/02/2016   a. 07/2016 biotronik-->req removal in 09/2016 and replacement in 11/2016 due to stitch abscess.  No plan to replace due to improved EF.   Arthritis     In my hands , back, feet  (12/20/2016)   Blood glucose elevated    March, 2012   CAD (coronary artery disease)    a. 2007 PCI/DES x 2 LAD; b. 05/2010 Cath: LM 10d, LAD 69m, 21m ISR, 80-90d, D2 20ost, D3 80, small, LCX nondom, nl, RI 52m, RCA large, 20p, 38m, RPDA 40p, RPL1 90ost, RPL2 80-->Med rx; c. 05/2015 CABG x 5 ([facility]).   CKD (chronic kidney disease), stage III (HCC)    Dyslipidemia    Gout    April, 201 to   Heart failure with improved ejection fraction (HFimpEF) (HCC)    a. 01/2016 Echo: EF 20%; b. 06/2016 Echo:  EF 30%; c. 08/2021 Echo: EF 50-55%. mild LVH. GrI DD. nl RV fxn. Sev dil LA. Triv MR.   Hypertension    Ischemic cardiomyopathy    a. 01/2016 Echo: EF 20%; b. 06/2016 Echo: EF 30%; c. 07/2016 s/p Biotronik AICD->removed 09/2016 due to stitch abscess->replaced 11/2016; d. 08/2021 Echo: EF 50-55%.   SOB (shortness of breath)    Type II diabetes mellitus (HCC)    Past Surgical History:  Procedure Laterality Date   BACK SURGERY     CARDIAC CATHETERIZATION  08/2005; 05/2010   thelbert 08/09/2010; notes 05/29/2010   CARDIAC DEFIBRILLATOR PLACEMENT  12/20/2016   CORONARY ANGIOPLASTY WITH STENT PLACEMENT  08/2005   thelbert 08/09/2010   CORONARY ARTERY BYPASS GRAFT  05/2015   CABG X5   ICD GENERATOR REMOVAL N/A 10/12/2016   Procedure: ICD Generator Removal;  Surgeon: Waddell Danelle ORN, MD;  Location: [facility] INVASIVE CV LAB;  Service: Cardiovascular;  Laterality: N/A;   ICD IMPLANT N/A 08/02/2016   Procedure: ICD Implant;  Surgeon: Waddell Danelle ORN, MD;  Location: [facility] INVASIVE CV LAB;  Service: Cardiovascular;  Laterality: N/A;   ICD IMPLANT N/A 12/20/2016   Procedure: ICD Implant;  Surgeon: Waddell Danelle ORN, MD;  Location: [facility] INVASIVE CV LAB;  Service: Cardiovascular;  Laterality: N/A;   ICD LEAD REMOVAL  10/12/2016  Procedure: Icd Lead Removal;  Surgeon: Waddell Danelle ORN, MD;  Location: [facility] INVASIVE CV LAB;  Service: Cardiovascular;;   LUMBAR DISC SURGERY  X 3   Social History:  reports that he quit smoking about 50 years ago. His smoking use included cigarettes. He started smoking about 65 years ago. He has a 30 pack-year smoking history. His smokeless tobacco use includes snuff. He reports that he does not drink alcohol  and does not use drugs.  Allergies[1]  Family History  Problem Relation Age of Onset   Coronary artery disease Father     Prior to Admission medications  Medication Sig Start Date End Date Taking? Authorizing Provider  acetaminophen  (TYLENOL ) 500 MG tablet Take 1,000 mg by mouth 2 (two) times  daily as needed for headache.    [provider]  carvedilol  (COREG ) 12.5 MG tablet Take 1.5 tablets (18.75 mg total) by mouth 2 (two) times daily. 02/22/23   Pearlean Manus, MD  diclofenac Sodium (VOLTAREN) 1 % GEL Apply 4 g topically daily as needed for pain. Patient not taking: Reported on 03/26/2023 07/05/21   [provider]  dutasteride (AVODART) 0.5 MG capsule Take 0.5 mg by mouth daily. 02/10/23   [provider]  furosemide  (LASIX ) 20 MG tablet Take 20 mg by mouth daily as needed for fluid. 04/08/21   [provider]  gabapentin  (NEURONTIN ) 600 MG tablet Take 600 mg by mouth 4 (four) times daily. 11/03/16   [provider]  glipiZIDE (GLUCOTROL XL) 2.5 MG 24 hr tablet Take 2.5 mg by mouth daily. Patient not taking: Reported on 03/26/2023 12/27/22   [provider]  HYDROcodone -acetaminophen  (NORCO) 10-325 MG tablet Take 1 tablet by mouth in the morning, at noon, in the evening, and at bedtime. 05/28/15   [provider]  losartan  (COZAAR ) 50 MG tablet Take 1 tablet (50 mg total) by mouth daily. 02/23/23   Pearlean Manus, MD  metFORMIN  (GLUCOPHAGE -XR) 500 MG 24 hr tablet Take 500 mg by mouth 2 (two) times daily.  02/15/16   [provider]  metoprolol succinate (TOPROL-XL) 25 MG 24 hr tablet Take 25 mg by mouth daily. 03/25/23   [provider]  NARCAN 4 MG/0.1ML LIQD nasal spray kit Place 1 spray into the nose daily as needed (Overdose reverse). 03/05/17   [provider]  pantoprazole (PROTONIX) 40 MG tablet Take 40 mg by mouth daily. 12/27/22   [provider]  potassium chloride  (KLOR-CON ) 10 MEQ tablet Take 10 mEq by mouth daily. 12/27/22   [provider]  rosuvastatin  (CRESTOR ) 10 MG tablet Take 1 tablet (10 mg total) by mouth daily. 03/18/24   Mallipeddi, Vishnu P, MD  tamsulosin  (FLOMAX ) 0.4 MG CAPS capsule Take 0.4 mg by mouth daily after breakfast.  01/12/16   [provider]    Physical Exam: Vitals:   04/18/24 1300 04/18/24 1415 04/18/24 1530 04/18/24 1706  BP: (!) 177/83 (!) 160/77 (!) 164/75 (!) 156/63  Pulse: 100 91 66 86  Resp: (!) 21 (!) 21 (!) 21 20  Temp:    98.2 F (36.8 C)  TempSrc:    Oral  SpO2: 98% 98% 98% 97%   General: Elderly male. Awake and alert and oriented x3. No acute cardiopulmonary distress.  HEENT: Normocephalic atraumatic.  Right and left ears normal in appearance.  Pupils equal, round, reactive to light. Extraocular muscles are intact. Sclerae anicteric and noninjected.  Moist mucosal membranes. No mucosal lesions.  Neck: Neck supple without lymphadenopathy. No carotid  bruits. No masses palpated.  Cardiovascular: Regular rate with normal S1-S2 sounds. No murmurs, rubs, gallops auscultated. No JVD.  Respiratory: Diffuse Rales. No accessory muscle use. Abdomen: Soft, nontender, nondistended. Active bowel sounds. No masses or hepatosplenomegaly  Skin: No rashes, lesions, or ulcerations.  Dry, warm to touch. 2+ dorsalis pedis and radial pulses. Musculoskeletal: No calf or leg pain. All major joints not erythematous nontender.  No upper or lower joint deformation.  Good ROM.  No contractures  Psychiatric: Intact judgment and insight. Pleasant and cooperative. Neurologic: No focal neurological deficits. Strength is 5/5 and symmetric in upper and lower extremities.  Cranial nerves II through XII are grossly intact.  Data Reviewed: Labs and imaging reviewed by me  Assessment and Plan: No notes have been filed under this hospital service. Service: Hospitalist  Principal Problem:   Sepsis (HCC) Active Problems:   CKD 3A   Essential hypertension   Hypertension   S/P CABG x 5   Chronic systolic (congestive) heart failure (HCC)   Multifocal pneumonia  Sepsis Antibiotics focused towards pneumonia Blood cultures and urine culture pending Multifocal pneumonia Antibiotics: Rocephin  and azithromycin  Robitussin Blood  cultures drawn in the emergency department Sputum cultures CBC tomorrow Influenza screen negative Altered mental status Neurology recommends TIA workup Hypertension Continue antihypertensives CAD with history of CABG Heart failure with reduced EF Hold Lasix    Advance Care Planning:   Code Status: Full Code confirmed by patient  Consults: None  Family Communication: Sister present during interview and exam  Severity of Illness: The appropriate patient status for this patient is INPATIENT. Inpatient status is judged to be reasonable and necessary in order to provide the required intensity of service to ensure the patient's safety. The patient's presenting symptoms, physical exam findings, and initial radiographic and laboratory data in the context of their chronic comorbidities is felt to place them at high risk for further clinical deterioration. Furthermore, it is not anticipated that the patient will be medically stable for discharge from the hospital within 2 midnights of admission.   * I certify that at the point of admission it is my clinical judgment that the patient will require inpatient hospital care spanning beyond 2 midnights from the point of admission due to high intensity of service, high risk for further deterioration and high frequency of surveillance required.*  Author: Arley Salamone J Dawson Albers, DO 04/18/2024 5:13 PM  For on call review [email or web address].     [1] No Known Allergies  "

## 2024-04-19 ENCOUNTER — Encounter (HOSPITAL_COMMUNITY): Payer: Self-pay | Admitting: Family Medicine

## 2024-04-19 DIAGNOSIS — G9341 Metabolic encephalopathy: Secondary | ICD-10-CM | POA: Diagnosis not present

## 2024-04-19 DIAGNOSIS — A419 Sepsis, unspecified organism: Secondary | ICD-10-CM

## 2024-04-19 DIAGNOSIS — R6520 Severe sepsis without septic shock: Secondary | ICD-10-CM

## 2024-04-19 LAB — CBC
HCT: 34.7 % — ABNORMAL LOW (ref 39.0–52.0)
Hemoglobin: 11.1 g/dL — ABNORMAL LOW (ref 13.0–17.0)
MCH: 27.1 pg (ref 26.0–34.0)
MCHC: 32 g/dL (ref 30.0–36.0)
MCV: 84.8 fL (ref 80.0–100.0)
Platelets: 117 10*3/uL — ABNORMAL LOW (ref 150–400)
RBC: 4.09 MIL/uL — ABNORMAL LOW (ref 4.22–5.81)
RDW: 15 % (ref 11.5–15.5)
WBC: 8.1 10*3/uL (ref 4.0–10.5)
nRBC: 0 % (ref 0.0–0.2)

## 2024-04-19 LAB — LIPID PANEL
Cholesterol: 83 mg/dL (ref 0–200)
HDL: 28 mg/dL — ABNORMAL LOW
LDL Cholesterol: 30 mg/dL (ref 0–99)
Total CHOL/HDL Ratio: 2.9 ratio
Triglycerides: 123 mg/dL
VLDL: 25 mg/dL (ref 0–40)

## 2024-04-19 LAB — BASIC METABOLIC PANEL WITH GFR
Anion gap: 8 (ref 5–15)
BUN: 15 mg/dL (ref 8–23)
CO2: 25 mmol/L (ref 22–32)
Calcium: 8.5 mg/dL — ABNORMAL LOW (ref 8.9–10.3)
Chloride: 107 mmol/L (ref 98–111)
Creatinine, Ser: 1.15 mg/dL (ref 0.61–1.24)
GFR, Estimated: >60 mL/min
Glucose, Bld: 113 mg/dL — ABNORMAL HIGH (ref 70–99)
Potassium: 3.8 mmol/L (ref 3.5–5.1)
Sodium: 141 mmol/L (ref 135–145)

## 2024-04-19 LAB — GLUCOSE, CAPILLARY
Glucose-Capillary: 105 mg/dL — ABNORMAL HIGH (ref 70–99)
Glucose-Capillary: 172 mg/dL — ABNORMAL HIGH (ref 70–99)
Glucose-Capillary: 187 mg/dL — ABNORMAL HIGH (ref 70–99)
Glucose-Capillary: 104 mg/dL — ABNORMAL HIGH (ref 70–99)

## 2024-04-19 LAB — HEMOGLOBIN A1C
Hgb A1c MFr Bld: 6.5 % — ABNORMAL HIGH (ref 4.8–5.6)
Mean Plasma Glucose: 139.85 mg/dL

## 2024-04-19 MED ORDER — ASPIRIN 81 MG PO TBEC
81.0000 mg | DELAYED_RELEASE_TABLET | Freq: Every day | ORAL | Status: DC
  Administered 2024-04-19 – 2024-04-21 (×3): 81 mg via ORAL
  Filled 2024-04-19 (×3): qty 1

## 2024-04-19 NOTE — Progress Notes (Signed)
 SLP Cancellation Note  Patient Details Name: Anthony Adams MRN: [id number] DOB: 31-Jul-1950   Cancelled treatment:       Reason Eval/Treat Not Completed: SLP screened, no needs identified, will sign off; symptoms resolved; CT negative for acute changes.    Pat Doris Gruhn,M.S., CCC-SLP 04/19/2024, 1:07 PM

## 2024-04-19 NOTE — Progress Notes (Addendum)
 "    Progress Note    Anthony Adams  FMW:[id number] DOB: 1950-05-17  DOA: 04/18/2024 PCP: Rosamond Leta NOVAK, MD      Brief Narrative:    Medical records reviewed and are as summarized below:  Anthony Adams is a 74 y.o. male with medical history significant for HFrEF, CAD with previous NSTEMI, hypertension, type II DM, CKD stage III, who presented to the hospital with confusion, slurred speech, frequent urination and urinary incontinence.  Code stroke was activated in the ED because of concern for acute stroke.  However CT head did not show any evidence of acute stroke.   Vitals in the ED: Temperature 101.3 F.,  Respiratory rate 21, pulse 112, BP 177/83, oxygen saturation 98% on room air.   Lactic acid 2.1,, WBC 13.2  CT chest without contrast IMPRESSION: 1. Findings compatible with multifocal pneumonia. Asymmetrical edema is possible but less likely. Follow-up imaging to resolution is recommended to exclude underlying pathology. 2. Multiple bilateral ground glass pulmonary nodules, most of which appear calcified, likely representing multiple calcified granulomas. 3. Ascending thoracic aortic aneurysm measuring 4.6 cm in diameter. Recommend cardiology/cardiothoracic surgery follow-up and surveillance CTA or MRA in 12 months. 4. Right thyroid  nodule measuring 3.2 cm. Recommend non-emergent thyroid  ultrasound.  Assessment/Plan:   Principal Problem:   Sepsis (HCC) Active Problems:   CKD 3A   Essential hypertension   Hypertension   S/P CABG x 5   Chronic systolic (congestive) heart failure (HCC)   Multifocal pneumonia    Body mass index is 28.22 kg/m.   Severe sepsis secondary to multifocal pneumonia: Continue IV ceftriaxone  and azithromycin .  Follow-up blood cultures. Lactic acid 2.1, 2.0 Influenza A, influenza B, RSV and SARS-CoV-2 tests were negative.   Acute metabolic encephalopathy, slurred speech: Resolved.  CT head negative for acute stroke.   Chronic  diastolic CHF history of chronic HFrEF with recovered EF: 2D echo in November 2024 showed EF estimated at 55 to 60%, grade 1 diastolic dysfunction, moderately dilated left atrium. EF 30% on 2D echo in April 2018.   CAD with prior CABG: Continue aspirin  and rosuvastatin    Type II DM: Continue Lantus  and NovoLog .   History of ICD but ICD battery has been dead for close to 3 years.  Decision was made not to check out the ICD.   Comorbidities include CKD stage IIIa, BPH, hypertension, dyslipidemia     Diet Order             Diet heart healthy/carb modified Room service appropriate? Yes; Fluid consistency: Thin  Diet effective now                                  Consultants: None  Procedures: None    Medications:    aspirin  EC  81 mg Oral Daily   azithromycin   500 mg Oral Daily   carvedilol   12.5 mg Oral BID   dutasteride   0.5 mg Oral Daily   enoxaparin  (LOVENOX ) injection  40 mg Subcutaneous Q24H   gabapentin   600 mg Oral QID   guaiFENesin   600 mg Oral BID   insulin  aspart  0-15 Units Subcutaneous TID WC   insulin  aspart  0-5 Units Subcutaneous QHS   insulin  glargine  20 Units Subcutaneous QHS   losartan   50 mg Oral Daily   pantoprazole   40 mg Oral Daily   rosuvastatin   10 mg Oral Daily   tamsulosin   0.4 mg Oral QPC breakfast   Continuous Infusions:  cefTRIAXone  (ROCEPHIN )  IV 2 g (04/19/24 1529)     Anti-infectives (From admission, onward)    Start     Dose/Rate Route Frequency Ordered Stop   04/19/24 1400  cefTRIAXone  (ROCEPHIN ) 2 g in sodium chloride  0.9 % 100 mL IVPB        2 g 200 mL/hr over 30 Minutes Intravenous Every 24 hours 04/18/24 1909 04/23/24 1359   04/18/24 2000  azithromycin  (ZITHROMAX ) tablet 500 mg        500 mg Oral Daily 04/18/24 1909 04/23/24 0959   04/18/24 1530  doxycycline  (VIBRAMYCIN ) 100 mg in sodium chloride  0.9 % 250 mL IVPB  Status:  Discontinued        100 mg 125 mL/hr over 120 Minutes Intravenous  Once  04/18/24 1524 04/18/24 1911   04/18/24 1300  cefTRIAXone  (ROCEPHIN ) 2 g in sodium chloride  0.9 % 100 mL IVPB        2 g 200 mL/hr over 30 Minutes Intravenous  Once 04/18/24 1259 04/18/24 1355              Family Communication/Anticipated D/C date and plan/Code Status   DVT prophylaxis: enoxaparin  (LOVENOX ) injection 40 mg Start: 04/18/24 2200     Code Status: Full Code  Family Communication: Plan discussed with his sister, Ronal, and brother-in-law at the bedside Disposition Plan: Plan to discharge home   Status is: Inpatient Remains inpatient appropriate because: Sepsis from pneumonia       Subjective:   Interval events noted.  No complaints.  He feels better.  Family (sister and brother-in-law) at the bedside  Objective:    Vitals:   04/19/24 0510 04/19/24 0529 04/19/24 0939 04/19/24 1344  BP: (!) 140/69  (!) 157/71 (!) 136/51  Pulse: 63  65 63  Resp: 15  18 17   Temp: 99.1 F (37.3 C)  98.6 F (37 C) 97.6 F (36.4 C)  TempSrc: Oral  Oral Oral  SpO2: 98%  97% 97%  Weight:  99.7 kg    Height:       No data found.   Intake/Output Summary (Last 24 hours) at 04/19/2024 1550 Last data filed at 04/19/2024 0954 Gross per 24 hour  Intake 1479 ml  Output 700 ml  Net 779 ml   Filed Weights   04/18/24 1726 04/19/24 0529  Weight: 104.3 kg 99.7 kg    Exam:  GEN: NAD SKIN: Warm and dry EYES: No pallor or icterus ENT: MMM CV: RRR PULM: Bibasilar rales bibasilar rales.  No wheezing ABD: soft, ND, NT, +BS CNS: AAO x 3, non focal, speech is clear and at baseline EXT: No edema or tenderness        Data Reviewed:   I have personally reviewed following labs and imaging studies:  Labs: Labs show the following:   Basic Metabolic Panel: Recent Labs  Lab 04/18/24 1240 04/18/24 1243 04/19/24 0406  NA 141 140 141  K 4.5 4.5 3.8  CL 105 105 107  CO2  --  19* 25  GLUCOSE 164* 158* 113*  BUN 16 14 15   CREATININE 1.40* 1.29* 1.15  CALCIUM   --   9.1 8.5*   GFR Estimated Creatinine Clearance: 74.3 mL/min (by C-G formula based on SCr of 1.15 mg/dL). Liver Function Tests: Recent Labs  Lab 04/18/24 1243  AST 17  ALT 11  ALKPHOS 49  BILITOT 0.7  PROT 7.2  ALBUMIN 4.2   No results for input(s): LIPASE,  AMYLASE in the last 168 hours. No results for input(s): AMMONIA in the last 168 hours. Coagulation profile Recent Labs  Lab 04/18/24 1243  INR 1.1    CBC: Recent Labs  Lab 04/18/24 1240 04/18/24 1243 04/19/24 0406  WBC  --  13.2* 8.1  NEUTROABS  --  11.9*  --   HGB 13.6 12.5* 11.1*  HCT 40.0 38.8* 34.7*  MCV  --  84.7 84.8  PLT  --  119* 117*   Cardiac Enzymes: No results for input(s): CKTOTAL, CKMB, CKMBINDEX, TROPONINI in the last 168 hours. BNP (last 3 results) No results for input(s): PROBNP in the last 8760 hours. CBG: Recent Labs  Lab 04/18/24 1234 04/18/24 2056 04/19/24 0755 04/19/24 1137  GLUCAP 147* 129* 105* 172*   D-Dimer: No results for input(s): DDIMER in the last 72 hours. Hgb A1c: Recent Labs    04/19/24 0406  HGBA1C 6.5*   Lipid Profile: Recent Labs    04/19/24 0406  CHOL 83  HDL 28*  LDLCALC 30  TRIG 876  CHOLHDL 2.9   Thyroid  function studies: No results for input(s): TSH, T4TOTAL, T3FREE, THYROIDAB in the last 72 hours.  Invalid input(s): FREET3 Anemia work up: No results for input(s): VITAMINB12, FOLATE, FERRITIN, TIBC, IRON, RETICCTPCT in the last 72 hours. Sepsis Labs: Recent Labs  Lab 04/18/24 1243 04/18/24 1304 04/18/24 1634 04/19/24 0406  PROCALCITON  --   --  0.30  --   WBC 13.2*  --   --  8.1  LATICACIDVEN  --  2.1* 2.0*  --     Microbiology Recent Results (from the past 240 hours)  Blood culture (routine x 2)     Status: None (Preliminary result)   Collection Time: 04/18/24  1:04 PM   Specimen: BLOOD  Result Value Ref Range Status   Specimen Description BLOOD BLOOD LEFT ARM  Final   Special Requests   Final     BOTTLES DRAWN AEROBIC AND ANAEROBIC Blood Culture adequate volume   Culture   Final    NO GROWTH < 24 HOURS Performed at [facility], [address], [place], [place] [address]    Report Status PENDING  Incomplete  Blood culture (routine x 2)     Status: None (Preliminary result)   Collection Time: 04/18/24  1:04 PM   Specimen: BLOOD  Result Value Ref Range Status   Specimen Description BLOOD BLOOD LEFT HAND  Final   Special Requests   Final    Blood Culture results may not be optimal due to an inadequate volume of blood received in culture bottles BOTTLES DRAWN AEROBIC AND ANAEROBIC   Culture   Final    NO GROWTH < 24 HOURS Performed at [facility], [address] [place], [place] [address]    Report Status PENDING  Incomplete  Resp panel by RT-PCR (RSV, Flu A&B, Covid) Anterior Nasal Swab     Status: None   Collection Time: 04/18/24  2:32 PM   Specimen: Anterior Nasal Swab  Result Value Ref Range Status   SARS Coronavirus 2 by RT PCR NEGATIVE NEGATIVE Final    Comment: (NOTE) SARS-CoV-2 target nucleic acids are NOT DETECTED.  The SARS-CoV-2 RNA is generally detectable in upper respiratory specimens during the acute phase of infection. The lowest concentration of SARS-CoV-2 viral copies this assay can detect is 138 copies/mL. A negative result does not preclude SARS-Cov-2 infection and should not be used as the sole basis for treatment or other patient management decisions. A negative  result may occur with  improper specimen collection/handling, submission of specimen other than nasopharyngeal swab, presence of viral mutation(s) within the areas targeted by this assay, and inadequate number of viral copies(<138 copies/mL). A negative result must be combined with clinical observations, patient history, and epidemiological information. The expected result is Negative.  Fact Sheet for Patients:  bloggercourse.com  Fact Sheet for Healthcare  Providers:  seriousbroker.it  This test is no t yet approved or cleared by the [place]  FDA and  has been authorized for detection and/or diagnosis of SARS-CoV-2 by FDA under an Emergency Use Authorization (EUA). This EUA will remain  in effect (meaning this test can be used) for the duration of the COVID-19 declaration under Section 564(b)(1) of the Act, 21 U.S.C.section 360bbb-3(b)(1), unless the authorization is terminated  or revoked sooner.       Influenza A by PCR NEGATIVE NEGATIVE Final   Influenza B by PCR NEGATIVE NEGATIVE Final    Comment: (NOTE) The Xpert Xpress SARS-CoV-2/FLU/RSV plus assay is intended as an aid in the diagnosis of influenza from Nasopharyngeal swab specimens and should not be used as a sole basis for treatment. Nasal washings and aspirates are unacceptable for Xpert Xpress SARS-CoV-2/FLU/RSV testing.  Fact Sheet for Patients: bloggercourse.com  Fact Sheet for Healthcare Providers: seriousbroker.it  This test is not yet approved or cleared by the [place]  FDA and has been authorized for detection and/or diagnosis of SARS-CoV-2 by FDA under an Emergency Use Authorization (EUA). This EUA will remain in effect (meaning this test can be used) for the duration of the COVID-19 declaration under Section 564(b)(1) of the Act, 21 U.S.C. section 360bbb-3(b)(1), unless the authorization is terminated or revoked.     Resp Syncytial Virus by PCR NEGATIVE NEGATIVE Final    Comment: (NOTE) Fact Sheet for Patients: bloggercourse.com  Fact Sheet for Healthcare Providers: seriousbroker.it  This test is not yet approved or cleared by the [place]  FDA and has been authorized for detection and/or diagnosis of SARS-CoV-2 by FDA under an Emergency Use Authorization (EUA). This EUA will remain in effect (meaning this test can be  used) for the duration of the COVID-19 declaration under Section 564(b)(1) of the Act, 21 U.S.C. section 360bbb-3(b)(1), unless the authorization is terminated or revoked.  Performed at [facility], [address], [place], [place] [address]     Procedures and diagnostic studies:  CT HEAD CODE STROKE WO CONTRAST (LKW 0-4.5h, LVO 0-24h) Addendum Date: 04/18/2024 ** ADDENDUM #1 ** ADDENDUM: Findings discussed with Lonni Conger, PA at 12:47 PM on 04/18/24. ---------------------------------------------------- Electronically signed by: Donnice Mania MD 04/18/2024 08:12 PM EST RP Workstation: [id number]   Result Date: 04/18/2024 ** ORIGINAL REPORT ** EXAM: CT HEAD WITHOUT CONTRAST 04/18/2024 12:27:14 PM TECHNIQUE: CT of the head was performed without the administration of intravenous contrast. Automated exposure control, iterative reconstruction, and/or weight based adjustment of the mA/kV was utilized to reduce the radiation dose to as low as reasonably achievable. COMPARISON: 02/18/2023 CLINICAL HISTORY: Acute neurological deficit, stroke suspected. FINDINGS: BRAIN AND VENTRICLES: No acute hemorrhage. No evidence of acute infarct. Remote infarct in the right basal ganglia involving the anterior limb of the internal capsule with partial extension into the caudate and likely involving the anterior lentiform nucleus. Mild chronic microvascular ischemic changes and mild parenchymal volume loss. Calcific intracranial atherosclerosis. No hydrocephalus. No extra-axial collection. No mass effect or midline shift. ORBITS: No acute abnormality. SINUSES: Mucosal thickening in the right maxillary sinus. SOFT TISSUES AND SKULL: No acute soft tissue  abnormality. No skull fracture. [place] stroke program early CT (ASPECT) score: Ganglionic (caudate, IC, lentiform nucleus, insula, M1-M3): 7. Supraganglionic (M4-M6): 3. Total: 10. IMPRESSION: 1. No acute intracranial abnormality. 2. ASPECTS 10. 3. Remote infarct in the  right basal ganglia. Electronically signed by: Donnice Mania MD 04/18/2024 12:42 PM EST RP Workstation: [id number]   CT Chest Wo Contrast Result Date: 04/18/2024 EXAM: CT CHEST WITHOUT CONTRAST 04/18/2024 04:13:08 PM TECHNIQUE: CT of the chest was performed without the administration of intravenous contrast. Multiplanar reformatted images are provided for review. Automated exposure control, iterative reconstruction, and/or weight based adjustment of the mA/kV was utilized to reduce the radiation dose to as low as reasonably achievable. COMPARISON: Comparison with chest radiograph 04/18/2024. CLINICAL HISTORY: Abnormal X-ray with lung opacity or opacities. FINDINGS: MEDIASTINUM: Cardiac pacemaker. Normal heart size. No pericardial effusions. Calcification of the aorta and coronary arteries. Ascending thoracic aortic aneurysm measuring 4.6 cm in diameter. Provide follow-up recommendations. The central airways are clear. The esophagus is decompressed. LYMPH NODES: Calcified lymph nodes in both hila and throughout the mediastinum. LUNGS AND PLEURA: To represent multifocal pneumonia. Asymmetrical edema is possible but less likely. Follow-up to resolution is recommended to exclude underlying malignancy. There are multiple bilateral ground glass pulmonary nodules throughout both lungs. Most, if not all, of these appear calcified and likely represent multiple calcified granulomas. No pleural effusion or pneumothorax. SOFT TISSUES/BONES: Sternotomy wires. Degenerative changes in the spine. No acute bony abnormalities. 3.2 cm diameter right thyroid  nodule. Provide follow-up recommendations. UPPER ABDOMEN: Limited images of the upper abdomen demonstrate calcified granulomas in the spleen. IMPRESSION: 1. Findings compatible with multifocal pneumonia. Asymmetrical edema is possible but less likely. Follow-up imaging to resolution is recommended to exclude underlying pathology. 2. Multiple bilateral ground glass pulmonary  nodules, most of which appear calcified, likely representing multiple calcified granulomas. 3. Ascending thoracic aortic aneurysm measuring 4.6 cm in diameter. Recommend cardiology/cardiothoracic surgery follow-up and surveillance CTA or MRA in 12 months. 4. Right thyroid  nodule measuring 3.2 cm. Recommend non-emergent thyroid  ultrasound. Electronically signed by: Elsie Gravely MD 04/18/2024 04:29 PM EST RP Workstation: [id number]   CT Angio Head Neck W WO CM Result Date: 04/18/2024 EXAM: CTA HEAD AND NECK WITH AND WITHOUT 04/18/2024 02:02:59 PM TECHNIQUE: CTA of the head and neck was performed with and without the administration of 75 mL iohexol  (OMNIPAQUE ) 350 MG/ML injection. Multiplanar 2D and/or 3D reformatted images are provided for review. Automated exposure control, iterative reconstruction, and/or weight based adjustment of the mA/kV was utilized to reduce the radiation dose to as low as reasonably achievable. Stenosis of the internal carotid arteries measured using NASCET criteria. COMPARISON: CT head 10/16/2024 at 12:20 pm. CLINICAL HISTORY: Neuro deficit, acute, stroke suspected. FINDINGS: CTA NECK: AORTIC ARCH AND ARCH VESSELS: There is atherosclerosis of the aortic arch and proximal great vessels. No significant stenosis of the brachiocephalic or subclavian arteries. CERVICAL CAROTID ARTERIES: There is mild stenosis of the proximal right cervical internal carotid artery in the setting of calcified and noncalcified atherosclerotic plaque. There is calcified plaque at the left carotid bifurcation and proximal left cervical ICA without hemodynamically significant stenosis. CERVICAL VERTEBRAL ARTERIES: No dissection, arterial injury, or significant stenosis. LUNGS AND MEDIASTINUM: There are densely calcified bilateral upper lung granulomas. Patchy biapical reticular and ground glass opacities. Right chest wall pacemaker. SOFT TISSUES: Absent dentition. There is enlargement of the right lobe of the  thyroid  gland appearing to owe to an approximately 3 x 3 cm nodule. BONES: There are degenerative changes of the cervical  spine. CTA HEAD: ANTERIOR CIRCULATION: Mild stenosis of the proximal cavernous right ICA. No significant stenosis of the left internal carotid artery. No significant stenosis of the anterior cerebral arteries. No significant stenosis of the middle cerebral arteries. No aneurysm. POSTERIOR CIRCULATION: No significant stenosis of the posterior cerebral arteries. No significant stenosis of the basilar artery. There is calcified plaque involving the proximal left intracranial vertebral artery with overall mild short-segment stenosis. No large vessel occlusion or hemodynamically significant stenosis. No aneurysm. OTHER: No dural venous sinus thrombosis on this non-dedicated study. IMPRESSION: 1. No large vessel occlusion or hemodynamically significant stenosis in the head or neck. 2. Mild atherosclerotic stenosis of the proximal right cervical ICA, proximal cavernous right ICA, and proximal left intracranial vertebral artery. 3. Patchy and reticular-groundgulass bilateral upper lung opacities. Recommend CT Chest for further evaluation. 4. A 3 cm right thyroid  nodule, with recommendation for non-emergent thyroid  ultrasound. Electronically signed by: Prentice Spade MD 04/18/2024 03:19 PM EST RP Workstation: [id number]   DG Chest Port 1 View Result Date: 04/18/2024 CLINICAL DATA:  Altered mental status. EXAM: PORTABLE CHEST 1 VIEW COMPARISON:  February 18, 2023 FINDINGS: There is stable single lead ventricular pacer positioning. Multiple sternal wires and vascular clips are seen. The cardiac silhouette is mildly enlarged which may be, in part, technical in origin. Low lung volumes are noted. No acute infiltrate, pleural effusion or pneumothorax is identified. Multilevel degenerative changes seen throughout the thoracic spine. IMPRESSION: 1. Evidence of prior median sternotomy/CABG. 2. Low lung volumes  without acute or active cardiopulmonary disease. Electronically Signed   By: Suzen Dials M.D.   On: 04/18/2024 14:25               LOS: 1 day   Zonnique Norkus  [facility]   Pager on [email or web address]. If 7PM-7AM, please contact night-coverage at [email or web address]     04/19/2024, 3:50 PM           "

## 2024-04-19 NOTE — Plan of Care (Signed)
 " Problem: Education: Goal: Knowledge of General Education information will improve Description: Including pain rating scale, medication(s)/side effects and non-pharmacologic comfort measures Outcome: Progressing   Problem: Health Behavior/Discharge Planning: Goal: Ability to manage health-related needs will improve Outcome: Progressing   Problem: Clinical Measurements: Goal: Ability to maintain clinical measurements within normal limits will improve Outcome: Progressing Goal: Will remain free from infection Outcome: Progressing Goal: Diagnostic test results will improve Outcome: Progressing Goal: Respiratory complications will improve Outcome: Progressing Goal: Cardiovascular complication will be avoided Outcome: Progressing   Problem: Nutrition: Goal: Adequate nutrition will be maintained Outcome: Progressing   Problem: Coping: Goal: Level of anxiety will decrease Outcome: Progressing   Problem: Elimination: Goal: Will not experience complications related to bowel motility Outcome: Progressing Goal: Will not experience complications related to urinary retention Outcome: Progressing   Problem: Pain Managment: Goal: General experience of comfort will improve and/or be controlled Outcome: Progressing   Problem: Safety: Goal: Ability to remain free from injury will improve Outcome: Progressing   Problem: Skin Integrity: Goal: Risk for impaired skin integrity will decrease Outcome: Progressing   Problem: Activity: Goal: Ability to tolerate increased activity will improve Outcome: Progressing   Problem: Clinical Measurements: Goal: Ability to maintain a body temperature in the normal range will improve Outcome: Progressing   Problem: Respiratory: Goal: Ability to maintain adequate ventilation will improve Outcome: Progressing Goal: Ability to maintain a clear airway will improve Outcome: Progressing   Problem: Education: Goal: Ability to describe self-care  measures that may prevent or decrease complications (Diabetes Survival Skills Education) will improve Outcome: Progressing Goal: Individualized Educational Video(s) Outcome: Progressing   Problem: Coping: Goal: Ability to adjust to condition or change in health will improve Outcome: Progressing   Problem: Fluid Volume: Goal: Ability to maintain a balanced intake and output will improve Outcome: Progressing   Problem: Health Behavior/Discharge Planning: Goal: Ability to identify and utilize available resources and services will improve Outcome: Progressing Goal: Ability to manage health-related needs will improve Outcome: Progressing   Problem: Metabolic: Goal: Ability to maintain appropriate glucose levels will improve Outcome: Progressing   Problem: Nutritional: Goal: Maintenance of adequate nutrition will improve Outcome: Progressing Goal: Progress toward achieving an optimal weight will improve Outcome: Progressing   Problem: Skin Integrity: Goal: Risk for impaired skin integrity will decrease Outcome: Progressing   Problem: Tissue Perfusion: Goal: Adequacy of tissue perfusion will improve Outcome: Progressing   Problem: Ischemic Stroke/TIA Tissue Perfusion: Goal: Complications of ischemic stroke/TIA will be minimized Outcome: Progressing   Problem: Coping: Goal: Will verbalize positive feelings about self Outcome: Progressing Goal: Will identify appropriate support needs Outcome: Progressing   Problem: Health Behavior/Discharge Planning: Goal: Ability to manage health-related needs will improve Outcome: Progressing Goal: Goals will be collaboratively established with patient/family Outcome: Progressing   Problem: Health Behavior/Discharge Planning: Goal: Ability to manage health-related needs will improve Outcome: Progressing Goal: Goals will be collaboratively established with patient/family Outcome: Progressing   Problem: Self-Care: Goal: Ability to  participate in self-care as condition permits will improve Outcome: Progressing Goal: Verbalization of feelings and concerns over difficulty with self-care will improve Outcome: Progressing Goal: Ability to communicate needs accurately will improve Outcome: Progressing   Problem: Nutrition: Goal: Risk of aspiration will decrease Outcome: Progressing Goal: Dietary intake will improve Outcome: Progressing   Problem: Education: Goal: Knowledge of General Education information will improve Description: Including pain rating scale, medication(s)/side effects and non-pharmacologic comfort measures Outcome: Progressing   Problem: Health Behavior/Discharge Planning: Goal: Ability to manage health-related needs will improve  Outcome: Progressing   Problem: Clinical Measurements: Goal: Ability to maintain clinical measurements within normal limits will improve Outcome: Progressing Goal: Will remain free from infection Outcome: Progressing Goal: Diagnostic test results will improve Outcome: Progressing Goal: Respiratory complications will improve Outcome: Progressing Goal: Cardiovascular complication will be avoided Outcome: Progressing   Problem: Activity: Goal: Risk for activity intolerance will decrease Outcome: Progressing   Problem: Nutrition: Goal: Adequate nutrition will be maintained Outcome: Progressing   Problem: Coping: Goal: Level of anxiety will decrease Outcome: Progressing   Problem: Elimination: Goal: Will not experience complications related to bowel motility Outcome: Progressing Goal: Will not experience complications related to urinary retention Outcome: Progressing   Problem: Pain Managment: Goal: General experience of comfort will improve and/or be controlled Outcome: Progressing   Problem: Safety: Goal: Ability to remain free from injury will improve Outcome: Progressing   Problem: Skin Integrity: Goal: Risk for impaired skin integrity will  decrease Outcome: Progressing   Problem: Activity: Goal: Ability to tolerate increased activity will improve Outcome: Progressing   Problem: Clinical Measurements: Goal: Ability to maintain a body temperature in the normal range will improve Outcome: Progressing   Problem: Respiratory: Goal: Ability to maintain adequate ventilation will improve Outcome: Progressing Goal: Ability to maintain a clear airway will improve Outcome: Progressing   Problem: Education: Goal: Ability to describe self-care measures that may prevent or decrease complications (Diabetes Survival Skills Education) will improve Outcome: Progressing Goal: Individualized Educational Video(s) Outcome: Progressing   Problem: Coping: Goal: Ability to adjust to condition or change in health will improve Outcome: Progressing   Problem: Fluid Volume: Goal: Ability to maintain a balanced intake and output will improve Outcome: Progressing   Problem: Health Behavior/Discharge Planning: Goal: Ability to identify and utilize available resources and services will improve Outcome: Progressing Goal: Ability to manage health-related needs will improve Outcome: Progressing   Problem: Metabolic: Goal: Ability to maintain appropriate glucose levels will improve Outcome: Progressing   Problem: Nutritional: Goal: Maintenance of adequate nutrition will improve Outcome: Progressing Goal: Progress toward achieving an optimal weight will improve Outcome: Progressing   Problem: Skin Integrity: Goal: Risk for impaired skin integrity will decrease Outcome: Progressing   Problem: Tissue Perfusion: Goal: Adequacy of tissue perfusion will improve Outcome: Progressing   Problem: Education: Goal: Knowledge of disease or condition will improve Outcome: Progressing Goal: Knowledge of secondary prevention will improve (MUST DOCUMENT ALL) Outcome: Progressing Goal: Knowledge of patient specific risk factors will improve  (DELETE if not current risk factor) Outcome: Progressing   Problem: Ischemic Stroke/TIA Tissue Perfusion: Goal: Complications of ischemic stroke/TIA will be minimized Outcome: Progressing   Problem: Coping: Goal: Will verbalize positive feelings about self Outcome: Progressing Goal: Will identify appropriate support needs Outcome: Progressing   Problem: Health Behavior/Discharge Planning: Goal: Ability to manage health-related needs will improve Outcome: Progressing Goal: Goals will be collaboratively established with patient/family Outcome: Progressing   Problem: Self-Care: Goal: Ability to participate in self-care as condition permits will improve Outcome: Progressing Goal: Verbalization of feelings and concerns over difficulty with self-care will improve Outcome: Progressing Goal: Ability to communicate needs accurately will improve Outcome: Progressing   Problem: Nutrition: Goal: Risk of aspiration will decrease Outcome: Progressing Goal: Dietary intake will improve Outcome: Progressing   "

## 2024-04-19 NOTE — Plan of Care (Signed)
" °  Problem: Education: Goal: Knowledge of General Education information will improve Description: Including pain rating scale, medication(s)/side effects and non-pharmacologic comfort measures Outcome: Progressing   Problem: Clinical Measurements: Goal: Ability to maintain clinical measurements within normal limits will improve Outcome: Progressing Goal: Will remain free from infection Outcome: Progressing Goal: Diagnostic test results will improve Outcome: Progressing Goal: Respiratory complications will improve Outcome: Progressing Goal: Cardiovascular complication will be avoided Outcome: Progressing   Problem: Activity: Goal: Risk for activity intolerance will decrease Outcome: Progressing   Problem: Nutrition: Goal: Adequate nutrition will be maintained Outcome: Progressing   Problem: Coping: Goal: Level of anxiety will decrease Outcome: Progressing   Problem: Pain Managment: Goal: General experience of comfort will improve and/or be controlled Outcome: Progressing   Problem: Safety: Goal: Ability to remain free from injury will improve Outcome: Progressing   Problem: Skin Integrity: Goal: Risk for impaired skin integrity will decrease Outcome: Progressing   Problem: Respiratory: Goal: Ability to maintain adequate ventilation will improve Outcome: Progressing   Problem: Education: Goal: Ability to describe self-care measures that may prevent or decrease complications (Diabetes Survival Skills Education) will improve Outcome: Progressing Goal: Individualized Educational Video(s) Outcome: Progressing   Problem: Health Behavior/Discharge Planning: Goal: Ability to identify and utilize available resources and services will improve Outcome: Progressing Goal: Ability to manage health-related needs will improve Outcome: Progressing   Problem: Skin Integrity: Goal: Risk for impaired skin integrity will decrease Outcome: Progressing   Problem: Ischemic  Stroke/TIA Tissue Perfusion: Goal: Complications of ischemic stroke/TIA will be minimized Outcome: Progressing   Problem: Coping: Goal: Will verbalize positive feelings about self Outcome: Progressing Goal: Will identify appropriate support needs Outcome: Progressing   Problem: Self-Care: Goal: Ability to participate in self-care as condition permits will improve Outcome: Progressing Goal: Verbalization of feelings and concerns over difficulty with self-care will improve Outcome: Progressing Goal: Ability to communicate needs accurately will improve Outcome: Progressing   Problem: Nutrition: Goal: Risk of aspiration will decrease Outcome: Progressing Goal: Dietary intake will improve Outcome: Progressing   "

## 2024-04-20 ENCOUNTER — Inpatient Hospital Stay (HOSPITAL_COMMUNITY): Payer: Medicare (Managed Care)

## 2024-04-20 DIAGNOSIS — R6520 Severe sepsis without septic shock: Secondary | ICD-10-CM | POA: Diagnosis not present

## 2024-04-20 DIAGNOSIS — G9341 Metabolic encephalopathy: Secondary | ICD-10-CM | POA: Diagnosis not present

## 2024-04-20 DIAGNOSIS — I10 Essential (primary) hypertension: Secondary | ICD-10-CM

## 2024-04-20 DIAGNOSIS — A419 Sepsis, unspecified organism: Secondary | ICD-10-CM | POA: Diagnosis not present

## 2024-04-20 LAB — BASIC METABOLIC PANEL WITH GFR
Anion gap: 10 (ref 5–15)
BUN: 17 mg/dL (ref 8–23)
CO2: 27 mmol/L (ref 22–32)
Calcium: 8.6 mg/dL — ABNORMAL LOW (ref 8.9–10.3)
Chloride: 108 mmol/L (ref 98–111)
Creatinine, Ser: 1.23 mg/dL (ref 0.61–1.24)
GFR, Estimated: >60 mL/min
Glucose, Bld: 96 mg/dL (ref 70–99)
Potassium: 3.8 mmol/L (ref 3.5–5.1)
Sodium: 144 mmol/L (ref 135–145)

## 2024-04-20 LAB — CBC WITH DIFFERENTIAL/PLATELET
Abs Immature Granulocytes: 0.03 10*3/uL (ref 0.00–0.07)
Basophils Absolute: 0 10*3/uL (ref 0.0–0.1)
Basophils Relative: 1 %
Eosinophils Absolute: 0.2 10*3/uL (ref 0.0–0.5)
Eosinophils Relative: 4 %
HCT: 34.3 % — ABNORMAL LOW (ref 39.0–52.0)
Hemoglobin: 10.9 g/dL — ABNORMAL LOW (ref 13.0–17.0)
Immature Granulocytes: 1 %
Lymphocytes Relative: 27 %
Lymphs Abs: 1.5 10*3/uL (ref 0.7–4.0)
MCH: 27.2 pg (ref 26.0–34.0)
MCHC: 31.8 g/dL (ref 30.0–36.0)
MCV: 85.5 fL (ref 80.0–100.0)
Monocytes Absolute: 0.4 10*3/uL (ref 0.1–1.0)
Monocytes Relative: 7 %
Neutro Abs: 3.4 10*3/uL (ref 1.7–7.7)
Neutrophils Relative %: 60 %
Platelets: 117 10*3/uL — ABNORMAL LOW (ref 150–400)
RBC: 4.01 MIL/uL — ABNORMAL LOW (ref 4.22–5.81)
RDW: 15.2 % (ref 11.5–15.5)
WBC: 5.5 10*3/uL (ref 4.0–10.5)
nRBC: 0 % (ref 0.0–0.2)

## 2024-04-20 LAB — ECHOCARDIOGRAM COMPLETE
Area-P 1/2: 4.06 cm2
Calc EF: 50.1 %
Height: 74 in
S' Lateral: 4.15 cm
Single Plane A2C EF: 48.7 %
Single Plane A4C EF: 49.8 %
Weight: 3576.74 [oz_av]

## 2024-04-20 LAB — GLUCOSE, CAPILLARY
Glucose-Capillary: 112 mg/dL — ABNORMAL HIGH (ref 70–99)
Glucose-Capillary: 171 mg/dL — ABNORMAL HIGH (ref 70–99)
Glucose-Capillary: 163 mg/dL — ABNORMAL HIGH (ref 70–99)
Glucose-Capillary: 184 mg/dL — ABNORMAL HIGH (ref 70–99)

## 2024-04-20 LAB — LACTIC ACID, PLASMA: Lactic Acid, Venous: 0.9 mmol/L (ref 0.5–1.9)

## 2024-04-20 NOTE — Progress Notes (Signed)
*  PRELIMINARY RESULTS* Echocardiogram 2D Echocardiogram has been performed.  Anthony Adams 04/20/2024, 11:46 AM

## 2024-04-20 NOTE — Progress Notes (Signed)
 "    Progress Note    Anthony Adams  FMW:[id number] DOB: Jun 30, 1950  DOA: 04/18/2024 PCP: Rosamond Leta NOVAK, MD      Brief Narrative:    Medical records reviewed and are as summarized below:  Anthony Adams is a 74 y.o. male with medical history significant for HFrEF, CAD with previous NSTEMI, hypertension, type II DM, CKD stage III, who presented to the hospital with confusion, slurred speech, frequent urination and urinary incontinence.  Code stroke was activated in the ED because of concern for acute stroke.  However CT head did not show any evidence of acute stroke.   Vitals in the ED: Temperature 101.3 F.,  Respiratory rate 21, pulse 112, BP 177/83, oxygen saturation 98% on room air.   Lactic acid 2.1,, WBC 13.2  CT chest without contrast IMPRESSION: 1. Findings compatible with multifocal pneumonia. Asymmetrical edema is possible but less likely. Follow-up imaging to resolution is recommended to exclude underlying pathology. 2. Multiple bilateral ground glass pulmonary nodules, most of which appear calcified, likely representing multiple calcified granulomas. 3. Ascending thoracic aortic aneurysm measuring 4.6 cm in diameter. Recommend cardiology/cardiothoracic surgery follow-up and surveillance CTA or MRA in 12 months. 4. Right thyroid  nodule measuring 3.2 cm. Recommend non-emergent thyroid  ultrasound.    Assessment/Plan:   Principal Problem:   Sepsis (HCC) Active Problems:   CKD 3A   Essential hypertension   Hypertension   S/P CABG x 5   Chronic systolic (congestive) heart failure (HCC)   Multifocal pneumonia    Body mass index is 28.7 kg/m.   Severe sepsis secondary to multifocal pneumonia: Continue IV ceftriaxone  and oral azithromycin  for another day.  Plan to switch to oral antibiotics tomorrow.  No growth on blood cultures thus far. Lactic acid 2.1, 2.0 Influenza A, influenza B, RSV and SARS-CoV-2 tests were negative.   Acute metabolic encephalopathy,  slurred speech: Resolved.  CT head negative for acute stroke.   Chronic diastolic CHF history of chronic HFrEF with recovered EF: 2D echo in November 2024 showed EF estimated at 55 to 60%, grade 1 diastolic dysfunction, moderately dilated left atrium. EF 30% on 2D echo in April 2018.   CAD with prior CABG: Continue aspirin  and rosuvastatin    Type II DM: Continue Lantus  and NovoLog .   History of ICD but ICD battery has been dead for close to 3 years.  Decision was made not to remove the ICD.   Comorbidities include CKD stage IIIa, BPH, hypertension, dyslipidemia     Diet Order             Diet heart healthy/carb modified Room service appropriate? Yes; Fluid consistency: Thin  Diet effective now                                  Consultants: None  Procedures: None    Medications:    aspirin  EC  81 mg Oral Daily   azithromycin   500 mg Oral Daily   carvedilol   12.5 mg Oral BID   dutasteride   0.5 mg Oral Daily   enoxaparin  (LOVENOX ) injection  40 mg Subcutaneous Q24H   gabapentin   600 mg Oral QID   guaiFENesin   600 mg Oral BID   insulin  aspart  0-15 Units Subcutaneous TID WC   insulin  aspart  0-5 Units Subcutaneous QHS   insulin  glargine  20 Units Subcutaneous QHS   losartan   50 mg Oral Daily   pantoprazole   40 mg Oral Daily   rosuvastatin   10 mg Oral Daily   tamsulosin   0.4 mg Oral QPC breakfast   Continuous Infusions:  cefTRIAXone  (ROCEPHIN )  IV 2 g (04/20/24 1441)     Anti-infectives (From admission, onward)    Start     Dose/Rate Route Frequency Ordered Stop   04/19/24 1400  cefTRIAXone  (ROCEPHIN ) 2 g in sodium chloride  0.9 % 100 mL IVPB        2 g 200 mL/hr over 30 Minutes Intravenous Every 24 hours 04/18/24 1909 04/23/24 1359   04/18/24 2000  azithromycin  (ZITHROMAX ) tablet 500 mg        500 mg Oral Daily 04/18/24 1909 04/23/24 0959   04/18/24 1530  doxycycline  (VIBRAMYCIN ) 100 mg in sodium chloride  0.9 % 250 mL IVPB  Status:   Discontinued        100 mg 125 mL/hr over 120 Minutes Intravenous  Once 04/18/24 1524 04/18/24 1911   04/18/24 1300  cefTRIAXone  (ROCEPHIN ) 2 g in sodium chloride  0.9 % 100 mL IVPB        2 g 200 mL/hr over 30 Minutes Intravenous  Once 04/18/24 1259 04/18/24 1355              Family Communication/Anticipated D/C date and plan/Code Status   DVT prophylaxis: enoxaparin  (LOVENOX ) injection 40 mg Start: 04/18/24 2200     Code Status: Full Code  Family Communication: None Disposition Plan: Plan to discharge home   Status is: Inpatient Remains inpatient appropriate because: Sepsis from pneumonia       Subjective:   Interval events noted.  He feels better.  No chest pain, shortness of breath or cough.  Objective:    Vitals:   04/19/24 2100 04/20/24 0018 04/20/24 0443 04/20/24 0507  BP: (!) 175/77 (!) 128/55 (!) 186/75 (!) 156/76  Pulse: 63 63 (!) 58   Resp:  17 16   Temp: (!) 97.4 F (36.3 C) 98 F (36.7 C) 98.4 F (36.9 C)   TempSrc: Oral Oral Oral   SpO2:  98% 97%   Weight:   101.4 kg   Height:       No data found.   Intake/Output Summary (Last 24 hours) at 04/20/2024 1509 Last data filed at 04/20/2024 0533 Gross per 24 hour  Intake 2481.26 ml  Output 1000 ml  Net 1481.26 ml   Filed Weights   04/18/24 1726 04/19/24 0529 04/20/24 0443  Weight: 104.3 kg 99.7 kg 101.4 kg    Exam:  GEN: NAD, sitting up in the chair SKIN: Warm and dry EYES: No pallor or icterus ENT: MMM CV: RRR PULM: Bibasilar rales ABD: soft, ND, NT, +BS CNS: AAO x 3, non focal EXT: No edema or tenderness        Data Reviewed:   I have personally reviewed following labs and imaging studies:  Labs: Labs show the following:   Basic Metabolic Panel: Recent Labs  Lab 04/18/24 1240 04/18/24 1243 04/19/24 0406 04/20/24 0410  NA 141 140 141 144  K 4.5 4.5 3.8 3.8  CL 105 105 107 108  CO2  --  19* 25 27  GLUCOSE 164* 158* 113* 96  BUN 16 14 15 17   CREATININE  1.40* 1.29* 1.15 1.23  CALCIUM   --  9.1 8.5* 8.6*   GFR Estimated Creatinine Clearance: 70 mL/min (by C-G formula based on SCr of 1.23 mg/dL). Liver Function Tests: Recent Labs  Lab 04/18/24 1243  AST 17  ALT 11  ALKPHOS 49  BILITOT 0.7  PROT 7.2  ALBUMIN 4.2   No results for input(s): LIPASE, AMYLASE in the last 168 hours. No results for input(s): AMMONIA in the last 168 hours. Coagulation profile Recent Labs  Lab 04/18/24 1243  INR 1.1    CBC: Recent Labs  Lab 04/18/24 1240 04/18/24 1243 04/19/24 0406 04/20/24 0410  WBC  --  13.2* 8.1 5.5  NEUTROABS  --  11.9*  --  3.4  HGB 13.6 12.5* 11.1* 10.9*  HCT 40.0 38.8* 34.7* 34.3*  MCV  --  84.7 84.8 85.5  PLT  --  119* 117* 117*   Cardiac Enzymes: No results for input(s): CKTOTAL, CKMB, CKMBINDEX, TROPONINI in the last 168 hours. BNP (last 3 results) No results for input(s): PROBNP in the last 8760 hours. CBG: Recent Labs  Lab 04/19/24 1137 04/19/24 1643 04/19/24 2135 04/20/24 0723 04/20/24 1139  GLUCAP 172* 187* 104* 112* 171*   D-Dimer: No results for input(s): DDIMER in the last 72 hours. Hgb A1c: Recent Labs    04/19/24 0406  HGBA1C 6.5*   Lipid Profile: Recent Labs    04/19/24 0406  CHOL 83  HDL 28*  LDLCALC 30  TRIG 876  CHOLHDL 2.9   Thyroid  function studies: No results for input(s): TSH, T4TOTAL, T3FREE, THYROIDAB in the last 72 hours.  Invalid input(s): FREET3 Anemia work up: No results for input(s): VITAMINB12, FOLATE, FERRITIN, TIBC, IRON, RETICCTPCT in the last 72 hours. Sepsis Labs: Recent Labs  Lab 04/18/24 1243 04/18/24 1304 04/18/24 1634 04/19/24 0406 04/20/24 0410  PROCALCITON  --   --  0.30  --   --   WBC 13.2*  --   --  8.1 5.5  LATICACIDVEN  --  2.1* 2.0*  --  0.9    Microbiology Recent Results (from the past 240 hours)  Blood culture (routine x 2)     Status: None (Preliminary result)   Collection Time: 04/18/24  1:04  PM   Specimen: BLOOD  Result Value Ref Range Status   Specimen Description BLOOD BLOOD LEFT ARM  Final   Special Requests   Final    BOTTLES DRAWN AEROBIC AND ANAEROBIC Blood Culture adequate volume   Culture   Final    NO GROWTH 2 DAYS Performed at [facility], [address] [place], [place] [address]    Report Status PENDING  Incomplete  Blood culture (routine x 2)     Status: None (Preliminary result)   Collection Time: 04/18/24  1:04 PM   Specimen: BLOOD  Result Value Ref Range Status   Specimen Description BLOOD BLOOD LEFT HAND  Final   Special Requests   Final    Blood Culture results may not be optimal due to an inadequate volume of blood received in culture bottles BOTTLES DRAWN AEROBIC AND ANAEROBIC   Culture   Final    NO GROWTH 2 DAYS Performed at [facility], [address], [place], [place] [address]    Report Status PENDING  Incomplete  Resp panel by RT-PCR (RSV, Flu A&B, Covid) Anterior Nasal Swab     Status: None   Collection Time: 04/18/24  2:32 PM   Specimen: Anterior Nasal Swab  Result Value Ref Range Status   SARS Coronavirus 2 by RT PCR NEGATIVE NEGATIVE Final    Comment: (NOTE) SARS-CoV-2 target nucleic acids are NOT DETECTED.  The SARS-CoV-2 RNA is generally detectable in upper respiratory specimens during the acute phase of infection. The lowest concentration of SARS-CoV-2 viral copies this assay  can detect is 138 copies/mL. A negative result does not preclude SARS-Cov-2 infection and should not be used as the sole basis for treatment or other patient management decisions. A negative result may occur with  improper specimen collection/handling, submission of specimen other than nasopharyngeal swab, presence of viral mutation(s) within the areas targeted by this assay, and inadequate number of viral copies(<138 copies/mL). A negative result must be combined with clinical observations, patient history, and epidemiological information. The expected  result is Negative.  Fact Sheet for Patients:  bloggercourse.com  Fact Sheet for Healthcare Providers:  seriousbroker.it  This test is no t yet approved or cleared by the [place]  FDA and  has been authorized for detection and/or diagnosis of SARS-CoV-2 by FDA under an Emergency Use Authorization (EUA). This EUA will remain  in effect (meaning this test can be used) for the duration of the COVID-19 declaration under Section 564(b)(1) of the Act, 21 U.S.C.section 360bbb-3(b)(1), unless the authorization is terminated  or revoked sooner.       Influenza A by PCR NEGATIVE NEGATIVE Final   Influenza B by PCR NEGATIVE NEGATIVE Final    Comment: (NOTE) The Xpert Xpress SARS-CoV-2/FLU/RSV plus assay is intended as an aid in the diagnosis of influenza from Nasopharyngeal swab specimens and should not be used as a sole basis for treatment. Nasal washings and aspirates are unacceptable for Xpert Xpress SARS-CoV-2/FLU/RSV testing.  Fact Sheet for Patients: bloggercourse.com  Fact Sheet for Healthcare Providers: seriousbroker.it  This test is not yet approved or cleared by the [place]  FDA and has been authorized for detection and/or diagnosis of SARS-CoV-2 by FDA under an Emergency Use Authorization (EUA). This EUA will remain in effect (meaning this test can be used) for the duration of the COVID-19 declaration under Section 564(b)(1) of the Act, 21 U.S.C. section 360bbb-3(b)(1), unless the authorization is terminated or revoked.     Resp Syncytial Virus by PCR NEGATIVE NEGATIVE Final    Comment: (NOTE) Fact Sheet for Patients: bloggercourse.com  Fact Sheet for Healthcare Providers: seriousbroker.it  This test is not yet approved or cleared by the [place]  FDA and has been authorized for detection and/or diagnosis of  SARS-CoV-2 by FDA under an Emergency Use Authorization (EUA). This EUA will remain in effect (meaning this test can be used) for the duration of the COVID-19 declaration under Section 564(b)(1) of the Act, 21 U.S.C. section 360bbb-3(b)(1), unless the authorization is terminated or revoked.  Performed at [facility], [address], [place], [place] [address]     Procedures and diagnostic studies:  CT HEAD CODE STROKE WO CONTRAST (LKW 0-4.5h, LVO 0-24h) Addendum Date: 04/18/2024 ** ADDENDUM #1 ** ADDENDUM: Findings discussed with Lonni Conger, PA at 12:47 PM on 04/18/24. ---------------------------------------------------- Electronically signed by: Donnice Mania MD 04/18/2024 08:12 PM EST RP Workstation: [id number]   Result Date: 04/18/2024 ** ORIGINAL REPORT ** EXAM: CT HEAD WITHOUT CONTRAST 04/18/2024 12:27:14 PM TECHNIQUE: CT of the head was performed without the administration of intravenous contrast. Automated exposure control, iterative reconstruction, and/or weight based adjustment of the mA/kV was utilized to reduce the radiation dose to as low as reasonably achievable. COMPARISON: 02/18/2023 CLINICAL HISTORY: Acute neurological deficit, stroke suspected. FINDINGS: BRAIN AND VENTRICLES: No acute hemorrhage. No evidence of acute infarct. Remote infarct in the right basal ganglia involving the anterior limb of the internal capsule with partial extension into the caudate and likely involving the anterior lentiform nucleus. Mild chronic microvascular ischemic changes and mild parenchymal volume loss. Calcific intracranial atherosclerosis.  No hydrocephalus. No extra-axial collection. No mass effect or midline shift. ORBITS: No acute abnormality. SINUSES: Mucosal thickening in the right maxillary sinus. SOFT TISSUES AND SKULL: No acute soft tissue abnormality. No skull fracture. [place] stroke program early CT (ASPECT) score: Ganglionic (caudate, IC, lentiform nucleus, insula, M1-M3): 7.  Supraganglionic (M4-M6): 3. Total: 10. IMPRESSION: 1. No acute intracranial abnormality. 2. ASPECTS 10. 3. Remote infarct in the right basal ganglia. Electronically signed by: Donnice Mania MD 04/18/2024 12:42 PM EST RP Workstation: [id number]   CT Chest Wo Contrast Result Date: 04/18/2024 EXAM: CT CHEST WITHOUT CONTRAST 04/18/2024 04:13:08 PM TECHNIQUE: CT of the chest was performed without the administration of intravenous contrast. Multiplanar reformatted images are provided for review. Automated exposure control, iterative reconstruction, and/or weight based adjustment of the mA/kV was utilized to reduce the radiation dose to as low as reasonably achievable. COMPARISON: Comparison with chest radiograph 04/18/2024. CLINICAL HISTORY: Abnormal X-ray with lung opacity or opacities. FINDINGS: MEDIASTINUM: Cardiac pacemaker. Normal heart size. No pericardial effusions. Calcification of the aorta and coronary arteries. Ascending thoracic aortic aneurysm measuring 4.6 cm in diameter. Provide follow-up recommendations. The central airways are clear. The esophagus is decompressed. LYMPH NODES: Calcified lymph nodes in both hila and throughout the mediastinum. LUNGS AND PLEURA: To represent multifocal pneumonia. Asymmetrical edema is possible but less likely. Follow-up to resolution is recommended to exclude underlying malignancy. There are multiple bilateral ground glass pulmonary nodules throughout both lungs. Most, if not all, of these appear calcified and likely represent multiple calcified granulomas. No pleural effusion or pneumothorax. SOFT TISSUES/BONES: Sternotomy wires. Degenerative changes in the spine. No acute bony abnormalities. 3.2 cm diameter right thyroid  nodule. Provide follow-up recommendations. UPPER ABDOMEN: Limited images of the upper abdomen demonstrate calcified granulomas in the spleen. IMPRESSION: 1. Findings compatible with multifocal pneumonia. Asymmetrical edema is possible but less likely.  Follow-up imaging to resolution is recommended to exclude underlying pathology. 2. Multiple bilateral ground glass pulmonary nodules, most of which appear calcified, likely representing multiple calcified granulomas. 3. Ascending thoracic aortic aneurysm measuring 4.6 cm in diameter. Recommend cardiology/cardiothoracic surgery follow-up and surveillance CTA or MRA in 12 months. 4. Right thyroid  nodule measuring 3.2 cm. Recommend non-emergent thyroid  ultrasound. Electronically signed by: Elsie Gravely MD 04/18/2024 04:29 PM EST RP Workstation: [id number]   CT Angio Head Neck W WO CM Result Date: 04/18/2024 EXAM: CTA HEAD AND NECK WITH AND WITHOUT 04/18/2024 02:02:59 PM TECHNIQUE: CTA of the head and neck was performed with and without the administration of 75 mL iohexol  (OMNIPAQUE ) 350 MG/ML injection. Multiplanar 2D and/or 3D reformatted images are provided for review. Automated exposure control, iterative reconstruction, and/or weight based adjustment of the mA/kV was utilized to reduce the radiation dose to as low as reasonably achievable. Stenosis of the internal carotid arteries measured using NASCET criteria. COMPARISON: CT head 10/16/2024 at 12:20 pm. CLINICAL HISTORY: Neuro deficit, acute, stroke suspected. FINDINGS: CTA NECK: AORTIC ARCH AND ARCH VESSELS: There is atherosclerosis of the aortic arch and proximal great vessels. No significant stenosis of the brachiocephalic or subclavian arteries. CERVICAL CAROTID ARTERIES: There is mild stenosis of the proximal right cervical internal carotid artery in the setting of calcified and noncalcified atherosclerotic plaque. There is calcified plaque at the left carotid bifurcation and proximal left cervical ICA without hemodynamically significant stenosis. CERVICAL VERTEBRAL ARTERIES: No dissection, arterial injury, or significant stenosis. LUNGS AND MEDIASTINUM: There are densely calcified bilateral upper lung granulomas. Patchy biapical reticular and ground  glass opacities. Right chest wall pacemaker. SOFT TISSUES: Absent  dentition. There is enlargement of the right lobe of the thyroid  gland appearing to owe to an approximately 3 x 3 cm nodule. BONES: There are degenerative changes of the cervical spine. CTA HEAD: ANTERIOR CIRCULATION: Mild stenosis of the proximal cavernous right ICA. No significant stenosis of the left internal carotid artery. No significant stenosis of the anterior cerebral arteries. No significant stenosis of the middle cerebral arteries. No aneurysm. POSTERIOR CIRCULATION: No significant stenosis of the posterior cerebral arteries. No significant stenosis of the basilar artery. There is calcified plaque involving the proximal left intracranial vertebral artery with overall mild short-segment stenosis. No large vessel occlusion or hemodynamically significant stenosis. No aneurysm. OTHER: No dural venous sinus thrombosis on this non-dedicated study. IMPRESSION: 1. No large vessel occlusion or hemodynamically significant stenosis in the head or neck. 2. Mild atherosclerotic stenosis of the proximal right cervical ICA, proximal cavernous right ICA, and proximal left intracranial vertebral artery. 3. Patchy and reticular-groundgulass bilateral upper lung opacities. Recommend CT Chest for further evaluation. 4. A 3 cm right thyroid  nodule, with recommendation for non-emergent thyroid  ultrasound. Electronically signed by: Prentice Spade MD 04/18/2024 03:19 PM EST RP Workstation: [id number]   DG Chest Port 1 View Result Date: 04/18/2024 CLINICAL DATA:  Altered mental status. EXAM: PORTABLE CHEST 1 VIEW COMPARISON:  February 18, 2023 FINDINGS: There is stable single lead ventricular pacer positioning. Multiple sternal wires and vascular clips are seen. The cardiac silhouette is mildly enlarged which may be, in part, technical in origin. Low lung volumes are noted. No acute infiltrate, pleural effusion or pneumothorax is identified. Multilevel  degenerative changes seen throughout the thoracic spine. IMPRESSION: 1. Evidence of prior median sternotomy/CABG. 2. Low lung volumes without acute or active cardiopulmonary disease. Electronically Signed   By: Suzen Dials M.D.   On: 04/18/2024 14:25               LOS: 2 days   Jovanny Stephanie  [facility]   Pager on [email or web address]. If 7PM-7AM, please contact night-coverage at [email or web address]     04/20/2024, 3:09 PM           "

## 2024-04-20 NOTE — Plan of Care (Signed)
°  Problem: Education: °Goal: Knowledge of disease or condition will improve °Outcome: Progressing °  °Problem: Self-Care: °Goal: Ability to participate in self-care as condition permits will improve °Outcome: Progressing °  °

## 2024-04-20 NOTE — Plan of Care (Signed)
 " Problem: Education: Goal: Knowledge of General Education information will improve Description: Including pain rating scale, medication(s)/side effects and non-pharmacologic comfort measures Outcome: Progressing   Problem: Health Behavior/Discharge Planning: Goal: Ability to manage health-related needs will improve Outcome: Progressing   Problem: Clinical Measurements: Goal: Ability to maintain clinical measurements within normal limits will improve Outcome: Progressing Goal: Will remain free from infection Outcome: Progressing Goal: Diagnostic test results will improve Outcome: Progressing Goal: Respiratory complications will improve Outcome: Progressing Goal: Cardiovascular complication will be avoided Outcome: Progressing   Problem: Activity: Goal: Risk for activity intolerance will decrease Outcome: Progressing   Problem: Nutrition: Goal: Adequate nutrition will be maintained Outcome: Progressing   Problem: Coping: Goal: Level of anxiety will decrease Outcome: Progressing   Problem: Elimination: Goal: Will not experience complications related to bowel motility Outcome: Progressing Goal: Will not experience complications related to urinary retention Outcome: Progressing   Problem: Pain Managment: Goal: General experience of comfort will improve and/or be controlled Outcome: Progressing   Problem: Safety: Goal: Ability to remain free from injury will improve Outcome: Progressing   Problem: Skin Integrity: Goal: Risk for impaired skin integrity will decrease Outcome: Progressing   Problem: Activity: Goal: Ability to tolerate increased activity will improve Outcome: Progressing   Problem: Clinical Measurements: Goal: Ability to maintain a body temperature in the normal range will improve Outcome: Progressing   Problem: Respiratory: Goal: Ability to maintain adequate ventilation will improve Outcome: Progressing Goal: Ability to maintain a clear airway  will improve Outcome: Progressing   Problem: Education: Goal: Ability to describe self-care measures that may prevent or decrease complications (Diabetes Survival Skills Education) will improve Outcome: Progressing Goal: Individualized Educational Video(s) Outcome: Progressing   Problem: Coping: Goal: Ability to adjust to condition or change in health will improve Outcome: Progressing   Problem: Fluid Volume: Goal: Ability to maintain a balanced intake and output will improve Outcome: Progressing   Problem: Health Behavior/Discharge Planning: Goal: Ability to identify and utilize available resources and services will improve Outcome: Progressing Goal: Ability to manage health-related needs will improve Outcome: Progressing   Problem: Metabolic: Goal: Ability to maintain appropriate glucose levels will improve Outcome: Progressing   Problem: Nutritional: Goal: Maintenance of adequate nutrition will improve Outcome: Progressing Goal: Progress toward achieving an optimal weight will improve Outcome: Progressing   Problem: Skin Integrity: Goal: Risk for impaired skin integrity will decrease Outcome: Progressing   Problem: Tissue Perfusion: Goal: Adequacy of tissue perfusion will improve Outcome: Progressing   Problem: Education: Goal: Knowledge of disease or condition will improve Outcome: Progressing Goal: Knowledge of secondary prevention will improve (MUST DOCUMENT ALL) Outcome: Progressing Goal: Knowledge of patient specific risk factors will improve (DELETE if not current risk factor) Outcome: Progressing   Problem: Ischemic Stroke/TIA Tissue Perfusion: Goal: Complications of ischemic stroke/TIA will be minimized Outcome: Progressing   Problem: Coping: Goal: Will verbalize positive feelings about self Outcome: Progressing Goal: Will identify appropriate support needs Outcome: Progressing   Problem: Health Behavior/Discharge Planning: Goal: Ability to  manage health-related needs will improve Outcome: Progressing Goal: Goals will be collaboratively established with patient/family Outcome: Progressing   Problem: Self-Care: Goal: Ability to participate in self-care as condition permits will improve Outcome: Progressing Goal: Verbalization of feelings and concerns over difficulty with self-care will improve Outcome: Progressing Goal: Ability to communicate needs accurately will improve Outcome: Progressing   Problem: Nutrition: Goal: Risk of aspiration will decrease Outcome: Progressing Goal: Dietary intake will improve Outcome: Progressing   Problem: Education: Goal: Knowledge of General Education  information will improve Description: Including pain rating scale, medication(s)/side effects and non-pharmacologic comfort measures Outcome: Progressing   Problem: Health Behavior/Discharge Planning: Goal: Ability to manage health-related needs will improve Outcome: Progressing   Problem: Clinical Measurements: Goal: Ability to maintain clinical measurements within normal limits will improve Outcome: Progressing Goal: Will remain free from infection Outcome: Progressing Goal: Diagnostic test results will improve Outcome: Progressing Goal: Respiratory complications will improve Outcome: Progressing Goal: Cardiovascular complication will be avoided Outcome: Progressing   Problem: Activity: Goal: Risk for activity intolerance will decrease Outcome: Progressing   Problem: Nutrition: Goal: Adequate nutrition will be maintained Outcome: Progressing   Problem: Coping: Goal: Level of anxiety will decrease Outcome: Progressing   Problem: Elimination: Goal: Will not experience complications related to bowel motility Outcome: Progressing Goal: Will not experience complications related to urinary retention Outcome: Progressing   Problem: Pain Managment: Goal: General experience of comfort will improve and/or be  controlled Outcome: Progressing   Problem: Safety: Goal: Ability to remain free from injury will improve Outcome: Progressing   Problem: Skin Integrity: Goal: Risk for impaired skin integrity will decrease Outcome: Progressing   Problem: Activity: Goal: Ability to tolerate increased activity will improve Outcome: Progressing   Problem: Clinical Measurements: Goal: Ability to maintain a body temperature in the normal range will improve Outcome: Progressing   Problem: Respiratory: Goal: Ability to maintain adequate ventilation will improve Outcome: Progressing Goal: Ability to maintain a clear airway will improve Outcome: Progressing   Problem: Education: Goal: Ability to describe self-care measures that may prevent or decrease complications (Diabetes Survival Skills Education) will improve Outcome: Progressing Goal: Individualized Educational Video(s) Outcome: Progressing   Problem: Coping: Goal: Ability to adjust to condition or change in health will improve Outcome: Progressing   Problem: Fluid Volume: Goal: Ability to maintain a balanced intake and output will improve Outcome: Progressing   Problem: Health Behavior/Discharge Planning: Goal: Ability to identify and utilize available resources and services will improve Outcome: Progressing Goal: Ability to manage health-related needs will improve Outcome: Progressing   Problem: Metabolic: Goal: Ability to maintain appropriate glucose levels will improve Outcome: Progressing   Problem: Nutritional: Goal: Maintenance of adequate nutrition will improve Outcome: Progressing Goal: Progress toward achieving an optimal weight will improve Outcome: Progressing   Problem: Skin Integrity: Goal: Risk for impaired skin integrity will decrease Outcome: Progressing   Problem: Tissue Perfusion: Goal: Adequacy of tissue perfusion will improve Outcome: Progressing   Problem: Education: Goal: Knowledge of disease or  condition will improve Outcome: Progressing Goal: Knowledge of secondary prevention will improve (MUST DOCUMENT ALL) Outcome: Progressing Goal: Knowledge of patient specific risk factors will improve (DELETE if not current risk factor) Outcome: Progressing   Problem: Ischemic Stroke/TIA Tissue Perfusion: Goal: Complications of ischemic stroke/TIA will be minimized Outcome: Progressing   Problem: Coping: Goal: Will verbalize positive feelings about self Outcome: Progressing Goal: Will identify appropriate support needs Outcome: Progressing   Problem: Health Behavior/Discharge Planning: Goal: Ability to manage health-related needs will improve Outcome: Progressing Goal: Goals will be collaboratively established with patient/family Outcome: Progressing   Problem: Self-Care: Goal: Ability to participate in self-care as condition permits will improve Outcome: Progressing Goal: Verbalization of feelings and concerns over difficulty with self-care will improve Outcome: Progressing Goal: Ability to communicate needs accurately will improve Outcome: Progressing   Problem: Nutrition: Goal: Risk of aspiration will decrease Outcome: Progressing Goal: Dietary intake will improve Outcome: Progressing   "

## 2024-04-21 LAB — GLUCOSE, CAPILLARY: Glucose-Capillary: 166 mg/dL — ABNORMAL HIGH (ref 70–99)

## 2024-04-21 MED ORDER — AMOXICILLIN-POT CLAVULANATE 875-125 MG PO TABS
1.0000 | ORAL_TABLET | Freq: Two times a day (BID) | ORAL | Status: DC
  Administered 2024-04-21: 1 via ORAL
  Filled 2024-04-21: qty 1

## 2024-04-21 MED ORDER — CARVEDILOL 12.5 MG PO TABS: 18.7500 mg | ORAL_TABLET | Freq: Two times a day (BID) | ORAL | Status: AC

## 2024-04-21 MED ORDER — AMOXICILLIN-POT CLAVULANATE 875-125 MG PO TABS: 1.0000 | ORAL_TABLET | Freq: Two times a day (BID) | ORAL | 0 refills | Status: AC

## 2024-04-21 NOTE — Plan of Care (Signed)
" °  Problem: Education: Goal: Knowledge of General Education information will improve Description: Including pain rating scale, medication(s)/side effects and non-pharmacologic comfort measures Outcome: Progressing   Problem: Clinical Measurements: Goal: Ability to maintain clinical measurements within normal limits will improve Outcome: Progressing Goal: Diagnostic test results will improve Outcome: Progressing   Problem: Coping: Goal: Level of anxiety will decrease Outcome: Progressing   Problem: Pain Managment: Goal: General experience of comfort will improve and/or be controlled Outcome: Progressing   Problem: Safety: Goal: Ability to remain free from injury will improve Outcome: Progressing   Problem: Clinical Measurements: Goal: Ability to maintain a body temperature in the normal range will improve Outcome: Progressing   Problem: Respiratory: Goal: Ability to maintain adequate ventilation will improve Outcome: Progressing Goal: Ability to maintain a clear airway will improve Outcome: Progressing   Problem: Education: Goal: Ability to describe self-care measures that may prevent or decrease complications (Diabetes Survival Skills Education) will improve Outcome: Progressing Goal: Individualized Educational Video(s) Outcome: Progressing   Problem: Fluid Volume: Goal: Ability to maintain a balanced intake and output will improve Outcome: Progressing   Problem: Metabolic: Goal: Ability to maintain appropriate glucose levels will improve Outcome: Progressing   Problem: Skin Integrity: Goal: Risk for impaired skin integrity will decrease Outcome: Progressing   Problem: Tissue Perfusion: Goal: Adequacy of tissue perfusion will improve Outcome: Progressing   Problem: Education: Goal: Knowledge of disease or condition will improve Outcome: Progressing Goal: Knowledge of secondary prevention will improve (MUST DOCUMENT ALL) Outcome: Progressing Goal: Knowledge  of patient specific risk factors will improve (DELETE if not current risk factor) Outcome: Progressing   Problem: Ischemic Stroke/TIA Tissue Perfusion: Goal: Complications of ischemic stroke/TIA will be minimized Outcome: Progressing   "

## 2024-04-21 NOTE — Progress Notes (Signed)
 OT Cancellation Note  Patient Details Name: JAQUAVIOUS MERCER MRN: [id number] DOB: Jul 21, 1950   Cancelled Treatment:    Reason Eval/Treat Not Completed: OT screened, no needs identified, will sign off. Pt fully dressed and standing in the room when this OT entered the room. Pt reported feeling fully back to normal and that he was about to go home. Pt removed from the OT list due to being at baseline with no needs.   Aemon Koeller OT, MOT   Jayson Person 04/21/2024, 11:11 AM

## 2024-04-21 NOTE — Progress Notes (Signed)
" °  Transition of Care [facility]) Screening Note   Patient Details  Name: DECKARD STUBER Date of Birth: 12/12/1950   Transition of Care [facility]) CM/SW Contact:    Hoy DELENA Bigness, LCSW Phone Number: 04/21/2024, 10:40 AM    Transition of Care Department [facility]) has reviewed patient and no TOC needs have been identified at this time. We will continue to monitor patient advancement through interdisciplinary progression rounds. If new patient transition needs arise, please place a TOC consult.    04/21/24 1039  TOC Brief Assessment  Insurance and Status Reviewed  Patient has primary care physician Yes  Home environment has been reviewed From home  Prior level of function: Independent  Prior/Current Home Services No current home services  Social Drivers of Health Review SDOH reviewed no interventions necessary  Readmission risk has been reviewed Yes  Transition of care needs no transition of care needs at this time    "

## 2024-04-21 NOTE — Discharge Summary (Signed)
 " Physician Discharge Summary   Patient: Anthony Adams MRN: [id number] DOB: 1950/10/06  Admit date:     04/18/2024  Discharge date: 04/21/24  Discharge Physician: AIDA CHO   PCP: Rosamond Leta NOVAK, MD   Recommendations at discharge:    Follow up with PCP in 1 to 2 weeks  Discharge Diagnoses: Principal Problem:   Sepsis (HCC) Active Problems:   CKD 3A   Essential hypertension   Hypertension   S/P CABG x 5   Chronic systolic (congestive) heart failure (HCC)   Multifocal pneumonia  Resolved Problems:   * No resolved hospital problems. *  Hospital Course:   Anthony Adams is a 74 y.o. male with medical history significant for HFrEF, CAD with previous NSTEMI, hypertension, type II DM, CKD stage III, who presented to the hospital with confusion, slurred speech, frequent urination and urinary incontinence.  Code stroke was activated in the ED because of concern for acute stroke.  However CT head did not show any evidence of acute stroke.     Vitals in the ED: Temperature 101.3 F.,  Respiratory rate 21, pulse 112, BP 177/83, oxygen saturation 98% on room air.     Lactic acid 2.1,, WBC 13.2   CT chest without contrast IMPRESSION: 1. Findings compatible with multifocal pneumonia. Asymmetrical edema is possible but less likely. Follow-up imaging to resolution is recommended to exclude underlying pathology. 2. Multiple bilateral ground glass pulmonary nodules, most of which appear calcified, likely representing multiple calcified granulomas. 3. Ascending thoracic aortic aneurysm measuring 4.6 cm in diameter. Recommend cardiology/cardiothoracic surgery follow-up and surveillance CTA or MRA in 12 months. 4. Right thyroid  nodule measuring 3.2 cm. Recommend non-emergent thyroid  ultrasound.        Assessment and Plan:   Severe sepsis secondary to multifocal pneumonia: He was treated with IV ceftriaxone  and azithromycin  for 3 days. He will be discharged on Augmentin .    Lactic  acid 2.1, 2.0 Influenza A, influenza B, RSV and SARS-CoV-2 tests were negative.     Acute metabolic encephalopathy, slurred speech: Resolved.  CT head negative for acute stroke.     Chronic diastolic CHF, history of chronic HFrEF with recovered EF:  Repeat echo showed mid range EF of 40 to 45%, grade 2 diastolic dysfunction.  2D echo in November 2024 showed EF estimated at 55 to 60%, grade 1 diastolic dysfunction, moderately dilated left atrium. EF 30% on 2D echo in April 2018.     CAD with prior CABG: Continue aspirin  and rosuvastatin      Type II DM: Continue Metformin  at discharge.     History of ICD but ICD battery has been dead for close to 3 years.  Decision was made not to remove the ICD.     Comorbidities include CKD stage IIIa, BPH, hypertension, dyslipidemia   Patient and family confirmed that he takes both Coreg  and Toprol. Last filled in Nov 25.  His condition has improved and he' deemed stable for discharge.   Discharge plan discussed with Ronal (sister) and Arne (brother-in-law) at the bedside.         Consultants: None Procedures performed: None  Disposition: Home Diet recommendation:  Discharge Diet Orders (From admission, onward)     Start     Ordered   04/21/24 0000  Diet Carb Modified        04/21/24 0900           Cardiac and Carb modified diet DISCHARGE MEDICATION: Allergies as of 04/21/2024   No Known  Allergies      Medication List     STOP taking these medications    diclofenac Sodium 1 % Gel Commonly known as: VOLTAREN   glipiZIDE 2.5 MG 24 hr tablet Commonly known as: GLUCOTROL XL       TAKE these medications    acetaminophen  500 MG tablet Commonly known as: TYLENOL  Take 1,000 mg by mouth 2 (two) times daily as needed for headache.   amoxicillin -clavulanate 875-125 MG tablet Commonly known as: AUGMENTIN  Take 1 tablet by mouth every 12 (twelve) hours for 7 doses.   aspirin  EC 81 MG tablet Take 1 tablet (81 mg  total) by mouth daily. Swallow whole. Start taking on: April 22, 2024   carvedilol  12.5 MG tablet Commonly known as: COREG  Take 1.5 tablets (18.75 mg total) by mouth 2 (two) times daily.   dutasteride  0.5 MG capsule Commonly known as: AVODART  Take 0.5 mg by mouth daily.   furosemide  20 MG tablet Commonly known as: LASIX  Take 20 mg by mouth daily as needed for fluid.   gabapentin  600 MG tablet Commonly known as: NEURONTIN  Take 600 mg by mouth 4 (four) times daily.   HYDROcodone -acetaminophen  10-325 MG tablet Commonly known as: NORCO Take 1 tablet by mouth in the morning, at noon, in the evening, and at bedtime.   losartan  50 MG tablet Commonly known as: COZAAR  Take 1 tablet (50 mg total) by mouth daily.   metFORMIN  500 MG 24 hr tablet Commonly known as: GLUCOPHAGE -XR Take 500 mg by mouth 2 (two) times daily.   metoprolol succinate 25 MG 24 hr tablet Commonly known as: TOPROL-XL Take 25 mg by mouth daily.   Narcan 4 MG/0.1ML Liqd nasal spray kit Generic drug: naloxone Place 1 spray into the nose daily as needed (Overdose reverse).   pantoprazole  40 MG tablet Commonly known as: PROTONIX  Take 40 mg by mouth daily.   potassium chloride  10 MEQ tablet Commonly known as: KLOR-CON  Take 10 mEq by mouth daily.   rosuvastatin  10 MG tablet Commonly known as: CRESTOR  Take 1 tablet (10 mg total) by mouth daily.   tamsulosin  0.4 MG Caps capsule Commonly known as: FLOMAX  Take 0.4 mg by mouth daily after breakfast.        Discharge Exam: Filed Weights   04/19/24 0529 04/20/24 0443 04/21/24 0500  Weight: 99.7 kg 101.4 kg 101.9 kg   GEN: NAD SKIN: Warm and dry. Skin tags on the back EYES: No pallor or icterus ENT: MMM CV: RRR PULM: CTA B ABD: soft, ND, NT, +BS CNS: AAO x 3, non focal EXT: No edema or tenderness    Condition at discharge: good  The results of significant diagnostics from this hospitalization (including imaging, microbiology, ancillary and  laboratory) are listed below for reference.   Imaging Studies: ECHOCARDIOGRAM COMPLETE Result Date: 04/20/2024    ECHOCARDIOGRAM REPORT   Patient Name:   Anthony Adams Date of Exam: 04/20/2024 Medical Rec #:  [id number]    Height:       74.0 in Accession #:    [id number]   Weight:       223.5 lb Date of Birth:  1950-12-21   BSA:          2.279 m Patient Age:    73 years     BP:           157/71 mmHg Patient Gender: M            HR:  65 bpm. Exam Location:  [facility] Procedure: 2D Echo, Cardiac Doppler, Color Doppler and Strain Analysis (Both            Spectral and Color Flow Doppler were utilized during procedure). Indications:    Stroke l63.9  History:        Patient has prior history of Echocardiogram examinations, most                 recent 02/19/2023. CHF, CAD and NSTEMI, Prior CABG,                 Defibrillator and Pacemaker; Risk Factors:Hypertension,                 Dyslipidemia and Diabetes.  Sonographer:    Aida Pizza RCS Referring Phys: [phone] JACOB J STINSON  Sonographer Comments: Global longitudinal strain was attempted. IMPRESSIONS  1. Left ventricular ejection fraction, by estimation, is 40 to 45%. The left ventricle has mildly decreased function. The left ventricle demonstrates global hypokinesis. The left ventricular internal cavity size was mildly dilated. There is mild left ventricular hypertrophy. Left ventricular diastolic parameters are consistent with Grade II diastolic dysfunction (pseudonormalization).  2. Right ventricular systolic function is mildly reduced. The right ventricular size is normal. Tricuspid regurgitation signal is inadequate for assessing PA pressure.  3. Left atrial size was moderately dilated.  4. The mitral valve is degenerative. Mild mitral valve regurgitation. No evidence of mitral stenosis.  5. The aortic valve is tricuspid. Aortic valve regurgitation is trivial. No aortic stenosis is present. Conclusion(s)/Recommendation(s): No intracardiac source of  embolism detected on this transthoracic study. Consider a transesophageal echocardiogram to exclude cardiac source of embolism if clinically indicated. FINDINGS  Left Ventricle: Left ventricular ejection fraction, by estimation, is 40 to 45%. The left ventricle has mildly decreased function. The left ventricle demonstrates global hypokinesis. The left ventricular internal cavity size was mildly dilated. There is  mild left ventricular hypertrophy. Left ventricular diastolic parameters are consistent with Grade II diastolic dysfunction (pseudonormalization). Right Ventricle: The right ventricular size is normal. No increase in right ventricular wall thickness. Right ventricular systolic function is mildly reduced. Tricuspid regurgitation signal is inadequate for assessing PA pressure. Left Atrium: Left atrial size was moderately dilated. Right Atrium: Right atrial size was normal in size. Pericardium: There is no evidence of pericardial effusion. Mitral Valve: The mitral valve is degenerative in appearance. Mild mitral annular calcification. Mild mitral valve regurgitation. No evidence of mitral valve stenosis. Tricuspid Valve: The tricuspid valve is normal in structure. Tricuspid valve regurgitation is trivial. No evidence of tricuspid stenosis. Aortic Valve: The aortic valve is tricuspid. Aortic valve regurgitation is trivial. No aortic stenosis is present. Pulmonic Valve: The pulmonic valve was normal in structure. Pulmonic valve regurgitation is trivial. No evidence of pulmonic stenosis. Aorta: The aortic root is normal in size and structure. Venous: The inferior vena cava was not well visualized. IAS/Shunts: The interatrial septum was not well visualized. Additional Comments: A device lead is visualized in the right atrium and right ventricle.  LEFT VENTRICLE PLAX 2D LVIDd:         5.95 cm      Diastology LVIDs:         4.15 cm      LV e' medial:    3.73 cm/s LV PW:         1.30 cm      LV E/e' medial:  22.1 LV  IVS:        1.20 cm  LV e' lateral:   7.62 cm/s                             LV E/e' lateral: 10.8  LV Volumes (MOD) LV vol d, MOD A2C: 135.0 ml LV vol d, MOD A4C: 163.0 ml LV vol s, MOD A2C: 69.3 ml LV vol s, MOD A4C: 81.8 ml LV SV MOD A2C:     65.7 ml LV SV MOD A4C:     163.0 ml LV SV MOD BP:      77.6 ml RIGHT VENTRICLE RV S prime:     11.00 cm/s TAPSE (M-mode): 2.2 cm LEFT ATRIUM              Index        RIGHT ATRIUM           Index LA diam:        5.35 cm  2.35 cm/m   RA Area:     22.70 cm LA Vol (A2C):   89.6 ml  39.32 ml/m  RA Volume:   63.20 ml  27.73 ml/m LA Vol (A4C):   118.0 ml 51.78 ml/m LA Biplane Vol: 112.0 ml 49.15 ml/m  AORTIC VALVE LVOT Vmax:   89.60 cm/s LVOT Vmean:  61.200 cm/s LVOT VTI:    0.202 m  AORTA Ao Root diam: 3.20 cm MITRAL VALVE MV Area (PHT): 4.06 cm    SHUNTS MV Decel Time: 187 msec    Systemic VTI: 0.20 m MV E velocity: 82.40 cm/s MV A velocity: 59.80 cm/s MV E/A ratio:  1.38 Soyla Merck MD Electronically signed by Soyla Merck MD Signature Date/Time: 04/20/2024/11:57:28 AM    Final    CT HEAD CODE STROKE WO CONTRAST (LKW 0-4.5h, LVO 0-24h) Addendum Date: 04/18/2024 ** ADDENDUM #1 ** ADDENDUM: Findings discussed with Lonni Conger, PA at 12:47 PM on 04/18/24. ---------------------------------------------------- Electronically signed by: Donnice Mania MD 04/18/2024 08:12 PM EST RP Workstation: [id number]   Result Date: 04/18/2024 ** ORIGINAL REPORT ** EXAM: CT HEAD WITHOUT CONTRAST 04/18/2024 12:27:14 PM TECHNIQUE: CT of the head was performed without the administration of intravenous contrast. Automated exposure control, iterative reconstruction, and/or weight based adjustment of the mA/kV was utilized to reduce the radiation dose to as low as reasonably achievable. COMPARISON: 02/18/2023 CLINICAL HISTORY: Acute neurological deficit, stroke suspected. FINDINGS: BRAIN AND VENTRICLES: No acute hemorrhage. No evidence of acute infarct. Remote infarct in the  right basal ganglia involving the anterior limb of the internal capsule with partial extension into the caudate and likely involving the anterior lentiform nucleus. Mild chronic microvascular ischemic changes and mild parenchymal volume loss. Calcific intracranial atherosclerosis. No hydrocephalus. No extra-axial collection. No mass effect or midline shift. ORBITS: No acute abnormality. SINUSES: Mucosal thickening in the right maxillary sinus. SOFT TISSUES AND SKULL: No acute soft tissue abnormality. No skull fracture. [place] stroke program early CT (ASPECT) score: Ganglionic (caudate, IC, lentiform nucleus, insula, M1-M3): 7. Supraganglionic (M4-M6): 3. Total: 10. IMPRESSION: 1. No acute intracranial abnormality. 2. ASPECTS 10. 3. Remote infarct in the right basal ganglia. Electronically signed by: Donnice Mania MD 04/18/2024 12:42 PM EST RP Workstation: [id number]   CT Chest Wo Contrast Result Date: 04/18/2024 EXAM: CT CHEST WITHOUT CONTRAST 04/18/2024 04:13:08 PM TECHNIQUE: CT of the chest was performed without the administration of intravenous contrast. Multiplanar reformatted images are provided for review. Automated exposure control, iterative reconstruction, and/or weight based adjustment of the mA/kV was utilized to reduce the radiation  dose to as low as reasonably achievable. COMPARISON: Comparison with chest radiograph 04/18/2024. CLINICAL HISTORY: Abnormal X-ray with lung opacity or opacities. FINDINGS: MEDIASTINUM: Cardiac pacemaker. Normal heart size. No pericardial effusions. Calcification of the aorta and coronary arteries. Ascending thoracic aortic aneurysm measuring 4.6 cm in diameter. Provide follow-up recommendations. The central airways are clear. The esophagus is decompressed. LYMPH NODES: Calcified lymph nodes in both hila and throughout the mediastinum. LUNGS AND PLEURA: To represent multifocal pneumonia. Asymmetrical edema is possible but less likely. Follow-up to resolution is recommended  to exclude underlying malignancy. There are multiple bilateral ground glass pulmonary nodules throughout both lungs. Most, if not all, of these appear calcified and likely represent multiple calcified granulomas. No pleural effusion or pneumothorax. SOFT TISSUES/BONES: Sternotomy wires. Degenerative changes in the spine. No acute bony abnormalities. 3.2 cm diameter right thyroid  nodule. Provide follow-up recommendations. UPPER ABDOMEN: Limited images of the upper abdomen demonstrate calcified granulomas in the spleen. IMPRESSION: 1. Findings compatible with multifocal pneumonia. Asymmetrical edema is possible but less likely. Follow-up imaging to resolution is recommended to exclude underlying pathology. 2. Multiple bilateral ground glass pulmonary nodules, most of which appear calcified, likely representing multiple calcified granulomas. 3. Ascending thoracic aortic aneurysm measuring 4.6 cm in diameter. Recommend cardiology/cardiothoracic surgery follow-up and surveillance CTA or MRA in 12 months. 4. Right thyroid  nodule measuring 3.2 cm. Recommend non-emergent thyroid  ultrasound. Electronically signed by: Elsie Gravely MD 04/18/2024 04:29 PM EST RP Workstation: [id number]   CT Angio Head Neck W WO CM Result Date: 04/18/2024 EXAM: CTA HEAD AND NECK WITH AND WITHOUT 04/18/2024 02:02:59 PM TECHNIQUE: CTA of the head and neck was performed with and without the administration of 75 mL iohexol  (OMNIPAQUE ) 350 MG/ML injection. Multiplanar 2D and/or 3D reformatted images are provided for review. Automated exposure control, iterative reconstruction, and/or weight based adjustment of the mA/kV was utilized to reduce the radiation dose to as low as reasonably achievable. Stenosis of the internal carotid arteries measured using NASCET criteria. COMPARISON: CT head 10/16/2024 at 12:20 pm. CLINICAL HISTORY: Neuro deficit, acute, stroke suspected. FINDINGS: CTA NECK: AORTIC ARCH AND ARCH VESSELS: There is atherosclerosis  of the aortic arch and proximal great vessels. No significant stenosis of the brachiocephalic or subclavian arteries. CERVICAL CAROTID ARTERIES: There is mild stenosis of the proximal right cervical internal carotid artery in the setting of calcified and noncalcified atherosclerotic plaque. There is calcified plaque at the left carotid bifurcation and proximal left cervical ICA without hemodynamically significant stenosis. CERVICAL VERTEBRAL ARTERIES: No dissection, arterial injury, or significant stenosis. LUNGS AND MEDIASTINUM: There are densely calcified bilateral upper lung granulomas. Patchy biapical reticular and ground glass opacities. Right chest wall pacemaker. SOFT TISSUES: Absent dentition. There is enlargement of the right lobe of the thyroid  gland appearing to owe to an approximately 3 x 3 cm nodule. BONES: There are degenerative changes of the cervical spine. CTA HEAD: ANTERIOR CIRCULATION: Mild stenosis of the proximal cavernous right ICA. No significant stenosis of the left internal carotid artery. No significant stenosis of the anterior cerebral arteries. No significant stenosis of the middle cerebral arteries. No aneurysm. POSTERIOR CIRCULATION: No significant stenosis of the posterior cerebral arteries. No significant stenosis of the basilar artery. There is calcified plaque involving the proximal left intracranial vertebral artery with overall mild short-segment stenosis. No large vessel occlusion or hemodynamically significant stenosis. No aneurysm. OTHER: No dural venous sinus thrombosis on this non-dedicated study. IMPRESSION: 1. No large vessel occlusion or hemodynamically significant stenosis in the head or neck. 2. Mild  atherosclerotic stenosis of the proximal right cervical ICA, proximal cavernous right ICA, and proximal left intracranial vertebral artery. 3. Patchy and reticular-groundgulass bilateral upper lung opacities. Recommend CT Chest for further evaluation. 4. A 3 cm right thyroid   nodule, with recommendation for non-emergent thyroid  ultrasound. Electronically signed by: Prentice Spade MD 04/18/2024 03:19 PM EST RP Workstation: [id number]   DG Chest Port 1 View Result Date: 04/18/2024 CLINICAL DATA:  Altered mental status. EXAM: PORTABLE CHEST 1 VIEW COMPARISON:  February 18, 2023 FINDINGS: There is stable single lead ventricular pacer positioning. Multiple sternal wires and vascular clips are seen. The cardiac silhouette is mildly enlarged which may be, in part, technical in origin. Low lung volumes are noted. No acute infiltrate, pleural effusion or pneumothorax is identified. Multilevel degenerative changes seen throughout the thoracic spine. IMPRESSION: 1. Evidence of prior median sternotomy/CABG. 2. Low lung volumes without acute or active cardiopulmonary disease. Electronically Signed   By: Suzen Dials M.D.   On: 04/18/2024 14:25    Microbiology: Results for orders placed or performed during the hospital encounter of 04/18/24  Blood culture (routine x 2)     Status: None (Preliminary result)   Collection Time: 04/18/24  1:04 PM   Specimen: BLOOD  Result Value Ref Range Status   Specimen Description BLOOD BLOOD LEFT ARM  Final   Special Requests   Final    BOTTLES DRAWN AEROBIC AND ANAEROBIC Blood Culture adequate volume   Culture   Final    NO GROWTH 3 DAYS Performed at [facility], [address], [place], [place] [address]    Report Status PENDING  Incomplete  Blood culture (routine x 2)     Status: None (Preliminary result)   Collection Time: 04/18/24  1:04 PM   Specimen: BLOOD  Result Value Ref Range Status   Specimen Description BLOOD BLOOD LEFT HAND  Final   Special Requests   Final    Blood Culture results may not be optimal due to an inadequate volume of blood received in culture bottles BOTTLES DRAWN AEROBIC AND ANAEROBIC   Culture   Final    NO GROWTH 3 DAYS Performed at [facility], [address] [place], [place] [address]    Report  Status PENDING  Incomplete  Resp panel by RT-PCR (RSV, Flu A&B, Covid) Anterior Nasal Swab     Status: None   Collection Time: 04/18/24  2:32 PM   Specimen: Anterior Nasal Swab  Result Value Ref Range Status   SARS Coronavirus 2 by RT PCR NEGATIVE NEGATIVE Final    Comment: (NOTE) SARS-CoV-2 target nucleic acids are NOT DETECTED.  The SARS-CoV-2 RNA is generally detectable in upper respiratory specimens during the acute phase of infection. The lowest concentration of SARS-CoV-2 viral copies this assay can detect is 138 copies/mL. A negative result does not preclude SARS-Cov-2 infection and should not be used as the sole basis for treatment or other patient management decisions. A negative result may occur with  improper specimen collection/handling, submission of specimen other than nasopharyngeal swab, presence of viral mutation(s) within the areas targeted by this assay, and inadequate number of viral copies(<138 copies/mL). A negative result must be combined with clinical observations, patient history, and epidemiological information. The expected result is Negative.  Fact Sheet for Patients:  bloggercourse.com  Fact Sheet for Healthcare Providers:  seriousbroker.it  This test is no t yet approved or cleared by the [place]  FDA and  has been authorized for detection and/or diagnosis of SARS-CoV-2 by FDA under  an Emergency Use Authorization (EUA). This EUA will remain  in effect (meaning this test can be used) for the duration of the COVID-19 declaration under Section 564(b)(1) of the Act, 21 U.S.C.section 360bbb-3(b)(1), unless the authorization is terminated  or revoked sooner.       Influenza A by PCR NEGATIVE NEGATIVE Final   Influenza B by PCR NEGATIVE NEGATIVE Final    Comment: (NOTE) The Xpert Xpress SARS-CoV-2/FLU/RSV plus assay is intended as an aid in the diagnosis of influenza from Nasopharyngeal swab  specimens and should not be used as a sole basis for treatment. Nasal washings and aspirates are unacceptable for Xpert Xpress SARS-CoV-2/FLU/RSV testing.  Fact Sheet for Patients: bloggercourse.com  Fact Sheet for Healthcare Providers: seriousbroker.it  This test is not yet approved or cleared by the [place]  FDA and has been authorized for detection and/or diagnosis of SARS-CoV-2 by FDA under an Emergency Use Authorization (EUA). This EUA will remain in effect (meaning this test can be used) for the duration of the COVID-19 declaration under Section 564(b)(1) of the Act, 21 U.S.C. section 360bbb-3(b)(1), unless the authorization is terminated or revoked.     Resp Syncytial Virus by PCR NEGATIVE NEGATIVE Final    Comment: (NOTE) Fact Sheet for Patients: bloggercourse.com  Fact Sheet for Healthcare Providers: seriousbroker.it  This test is not yet approved or cleared by the [place]  FDA and has been authorized for detection and/or diagnosis of SARS-CoV-2 by FDA under an Emergency Use Authorization (EUA). This EUA will remain in effect (meaning this test can be used) for the duration of the COVID-19 declaration under Section 564(b)(1) of the Act, 21 U.S.C. section 360bbb-3(b)(1), unless the authorization is terminated or revoked.  Performed at [facility], [address], [place], [place] [address]     Labs: CBC: Recent Labs  Lab 04/18/24 1240 04/18/24 1243 04/19/24 0406 04/20/24 0410  WBC  --  13.2* 8.1 5.5  NEUTROABS  --  11.9*  --  3.4  HGB 13.6 12.5* 11.1* 10.9*  HCT 40.0 38.8* 34.7* 34.3*  MCV  --  84.7 84.8 85.5  PLT  --  119* 117* 117*   Basic Metabolic Panel: Recent Labs  Lab 04/18/24 1240 04/18/24 1243 04/19/24 0406 04/20/24 0410  NA 141 140 141 144  K 4.5 4.5 3.8 3.8  CL 105 105 107 108  CO2  --  19* 25 27  GLUCOSE 164* 158* 113* 96   BUN 16 14 15 17   CREATININE 1.40* 1.29* 1.15 1.23  CALCIUM   --  9.1 8.5* 8.6*   Liver Function Tests: Recent Labs  Lab 04/18/24 1243  AST 17  ALT 11  ALKPHOS 49  BILITOT 0.7  PROT 7.2  ALBUMIN 4.2   CBG: Recent Labs  Lab 04/20/24 0723 04/20/24 1139 04/20/24 1632 04/20/24 2046 04/21/24 0714  GLUCAP 112* 171* 163* 184* 166*    Discharge time spent: greater than 30 minutes.  Signed: AIDA CHO, MD [facility] 04/21/2024 "

## 2024-04-23 LAB — CULTURE, BLOOD (ROUTINE X 2)
Culture: NO GROWTH
Culture: NO GROWTH
Special Requests: ADEQUATE

## 2024-04-24 ENCOUNTER — Other Ambulatory Visit: Payer: Self-pay | Admitting: Internal Medicine

## 2024-04-24 MED ORDER — ROSUVASTATIN CALCIUM 10 MG PO TABS: 10.0000 mg | ORAL_TABLET | Freq: Every day | ORAL | 1 refills | Status: AC

## 2024-04-24 NOTE — Telephone Encounter (Signed)
 In accordance with refill protocols, please review and address the following requirements before this medication refill can be authorized:  Appointment  and Labs  Pt has an appt on 06/18/24 with Lum Louis, NP and pt has had a lipid panel done on 04/19/2024

## 2024-06-18 ENCOUNTER — Ambulatory Visit: Payer: Medicare (Managed Care) | Admitting: Emergency Medicine

## 2024-06-18 ENCOUNTER — Ambulatory Visit: Payer: Medicare (Managed Care) | Admitting: Student
